# Patient Record
Sex: Female | Born: 1935 | Race: White | Hispanic: No | Smoking: Never smoker
Health system: Southern US, Community
[De-identification: ages and names within clinical notes are randomized; demographics above are authoritative.]

## PROBLEM LIST (undated history)

## (undated) DIAGNOSIS — E7800 Pure hypercholesterolemia, unspecified: Secondary | ICD-10-CM

## (undated) DIAGNOSIS — H35349 Macular cyst, hole, or pseudohole, unspecified eye: Secondary | ICD-10-CM

## (undated) DIAGNOSIS — A4902 Methicillin resistant Staphylococcus aureus infection, unspecified site: Secondary | ICD-10-CM

## (undated) DIAGNOSIS — K859 Acute pancreatitis without necrosis or infection, unspecified: Secondary | ICD-10-CM

## (undated) DIAGNOSIS — G8929 Other chronic pain: Secondary | ICD-10-CM

## (undated) DIAGNOSIS — I4510 Unspecified right bundle-branch block: Secondary | ICD-10-CM

## (undated) DIAGNOSIS — R519 Headache, unspecified: Secondary | ICD-10-CM

## (undated) DIAGNOSIS — R51 Headache: Secondary | ICD-10-CM

## (undated) DIAGNOSIS — M810 Age-related osteoporosis without current pathological fracture: Secondary | ICD-10-CM

## (undated) DIAGNOSIS — N83209 Unspecified ovarian cyst, unspecified side: Secondary | ICD-10-CM

## (undated) DIAGNOSIS — I350 Nonrheumatic aortic (valve) stenosis: Secondary | ICD-10-CM

## (undated) DIAGNOSIS — R5382 Chronic fatigue, unspecified: Secondary | ICD-10-CM

## (undated) DIAGNOSIS — M549 Dorsalgia, unspecified: Secondary | ICD-10-CM

## (undated) DIAGNOSIS — I10 Essential (primary) hypertension: Secondary | ICD-10-CM

## (undated) DIAGNOSIS — B958 Unspecified staphylococcus as the cause of diseases classified elsewhere: Secondary | ICD-10-CM

## (undated) DIAGNOSIS — M4850XA Collapsed vertebra, not elsewhere classified, site unspecified, initial encounter for fracture: Secondary | ICD-10-CM

## (undated) HISTORY — DX: Unspecified right bundle-branch block: I45.10

## (undated) HISTORY — DX: Collapsed vertebra, not elsewhere classified, site unspecified, initial encounter for fracture: M48.50XA

## (undated) HISTORY — PX: ABDOMINAL HYSTERECTOMY: SHX81

## (undated) HISTORY — PX: OTHER SURGICAL HISTORY: SHX169

## (undated) HISTORY — DX: Macular cyst, hole, or pseudohole, unspecified eye: H35.349

## (undated) HISTORY — DX: Chronic fatigue, unspecified: R53.82

## (undated) HISTORY — DX: Pure hypercholesterolemia, unspecified: E78.00

## (undated) HISTORY — DX: Essential (primary) hypertension: I10

## (undated) HISTORY — PX: APPENDECTOMY: SHX54

## (undated) HISTORY — DX: Nonrheumatic aortic (valve) stenosis: I35.0

## (undated) HISTORY — PX: CHOLECYSTECTOMY: SHX55

## (undated) HISTORY — DX: Acute pancreatitis without necrosis or infection, unspecified: K85.90

---

## 1998-01-12 ENCOUNTER — Other Ambulatory Visit: Admission: RE | Admit: 1998-01-12 | Discharge: 1998-01-12 | Payer: Self-pay | Admitting: Obstetrics & Gynecology

## 1998-02-08 ENCOUNTER — Emergency Department (HOSPITAL_COMMUNITY): Admission: EM | Admit: 1998-02-08 | Discharge: 1998-02-08 | Payer: Self-pay | Admitting: Emergency Medicine

## 1998-02-21 ENCOUNTER — Ambulatory Visit (HOSPITAL_BASED_OUTPATIENT_CLINIC_OR_DEPARTMENT_OTHER): Admission: RE | Admit: 1998-02-21 | Discharge: 1998-02-21 | Payer: Self-pay | Admitting: Orthopedic Surgery

## 1999-01-31 ENCOUNTER — Other Ambulatory Visit: Admission: RE | Admit: 1999-01-31 | Discharge: 1999-01-31 | Payer: Self-pay | Admitting: Obstetrics and Gynecology

## 1999-02-07 ENCOUNTER — Encounter: Admission: RE | Admit: 1999-02-07 | Discharge: 1999-02-07 | Payer: Self-pay | Admitting: *Deleted

## 1999-02-14 ENCOUNTER — Encounter: Admission: RE | Admit: 1999-02-14 | Discharge: 1999-02-14 | Payer: Self-pay | Admitting: *Deleted

## 2000-03-12 ENCOUNTER — Encounter: Admission: RE | Admit: 2000-03-12 | Discharge: 2000-03-12 | Payer: Self-pay | Admitting: *Deleted

## 2000-04-11 ENCOUNTER — Other Ambulatory Visit: Admission: RE | Admit: 2000-04-11 | Discharge: 2000-04-11 | Payer: Self-pay | Admitting: Obstetrics and Gynecology

## 2001-07-28 ENCOUNTER — Encounter: Payer: Self-pay | Admitting: Obstetrics and Gynecology

## 2001-07-28 ENCOUNTER — Encounter: Admission: RE | Admit: 2001-07-28 | Discharge: 2001-07-28 | Payer: Self-pay | Admitting: Obstetrics and Gynecology

## 2001-09-07 ENCOUNTER — Other Ambulatory Visit: Admission: RE | Admit: 2001-09-07 | Discharge: 2001-09-07 | Payer: Self-pay | Admitting: Obstetrics and Gynecology

## 2002-11-11 ENCOUNTER — Encounter: Admission: RE | Admit: 2002-11-11 | Discharge: 2002-11-11 | Payer: Self-pay | Admitting: Obstetrics and Gynecology

## 2002-11-11 ENCOUNTER — Encounter: Payer: Self-pay | Admitting: Obstetrics and Gynecology

## 2004-02-08 ENCOUNTER — Ambulatory Visit (HOSPITAL_COMMUNITY): Admission: RE | Admit: 2004-02-08 | Discharge: 2004-02-08 | Payer: Self-pay | Admitting: Internal Medicine

## 2004-04-23 ENCOUNTER — Encounter: Admission: RE | Admit: 2004-04-23 | Discharge: 2004-04-23 | Payer: Self-pay | Admitting: Orthopedic Surgery

## 2004-04-24 ENCOUNTER — Ambulatory Visit (HOSPITAL_COMMUNITY): Admission: RE | Admit: 2004-04-24 | Discharge: 2004-04-24 | Payer: Self-pay | Admitting: Orthopedic Surgery

## 2004-04-24 ENCOUNTER — Ambulatory Visit (HOSPITAL_BASED_OUTPATIENT_CLINIC_OR_DEPARTMENT_OTHER): Admission: RE | Admit: 2004-04-24 | Discharge: 2004-04-24 | Payer: Self-pay | Admitting: Orthopedic Surgery

## 2005-05-14 ENCOUNTER — Ambulatory Visit (HOSPITAL_COMMUNITY): Admission: RE | Admit: 2005-05-14 | Discharge: 2005-05-14 | Payer: Self-pay | Admitting: Obstetrics and Gynecology

## 2006-05-16 ENCOUNTER — Ambulatory Visit (HOSPITAL_COMMUNITY): Admission: RE | Admit: 2006-05-16 | Discharge: 2006-05-16 | Payer: Self-pay | Admitting: Obstetrics and Gynecology

## 2007-05-28 ENCOUNTER — Ambulatory Visit (HOSPITAL_COMMUNITY): Admission: RE | Admit: 2007-05-28 | Discharge: 2007-05-28 | Payer: Self-pay | Admitting: Obstetrics and Gynecology

## 2008-06-10 ENCOUNTER — Ambulatory Visit (HOSPITAL_COMMUNITY): Admission: RE | Admit: 2008-06-10 | Discharge: 2008-06-10 | Payer: Self-pay | Admitting: Obstetrics and Gynecology

## 2009-07-25 ENCOUNTER — Ambulatory Visit (HOSPITAL_COMMUNITY): Admission: RE | Admit: 2009-07-25 | Discharge: 2009-07-25 | Payer: Self-pay | Admitting: Obstetrics and Gynecology

## 2010-04-13 ENCOUNTER — Ambulatory Visit: Payer: Self-pay | Admitting: Cardiology

## 2010-04-24 ENCOUNTER — Ambulatory Visit (INDEPENDENT_AMBULATORY_CARE_PROVIDER_SITE_OTHER): Payer: Medicare Other | Admitting: Cardiology

## 2010-04-24 DIAGNOSIS — I10 Essential (primary) hypertension: Secondary | ICD-10-CM

## 2010-04-24 DIAGNOSIS — I452 Bifascicular block: Secondary | ICD-10-CM

## 2010-04-24 DIAGNOSIS — I359 Nonrheumatic aortic valve disorder, unspecified: Secondary | ICD-10-CM

## 2010-06-29 NOTE — Op Note (Signed)
Mallory Jensen, Mallory Jensen           ACCOUNT NO.:  [id number]   MEDICAL RECORD NO.:  [id number]          PATIENT TYPE:  AMB   LOCATION:  DSC                          FACILITY:  [facility]   PHYSICIAN:  Katy Fitch. Sypher Montez Hageman., M.D.DATE OF BIRTH:  1922/07/17   DATE OF PROCEDURE:  04/24/2004  DATE OF DISCHARGE:                                 OPERATIVE REPORT   PREOPERATIVE DIAGNOSIS:  Chronic entrapment neuropathy, right median nerve  at carpal tunnel.   POSTOPERATIVE DIAGNOSIS:  Chronic entrapment neuropathy, right median nerve  at carpal tunnel.   OPERATION:  Release of right transverse carpal ligament.   OPERATING SURGEON:  Katy Fitch. Sypher, M.D.   ASSISTANT:  Jonni Sanger, P.A.   ANESTHESIA:  General by LMA.   SUPERVISING ANESTHESIOLOGIST:  Dr. Adonis Huguenin.   INDICATIONS:  Mallory Jensen is a well-known 77 year old woman who has  had a history of chronic hand pain and numbness. Clinical examination  revealed signs of carpal tunnel syndrome. Electrodiagnostic studies  confirmed median neuropathy at the level of the right wrist.  Due to her  failure to respond to nonoperative measures, she is brought to this time for  release of right transverse carpal ligament.   PROCEDURE:  Mallory Jensen was brought to the operating room and placed  supine position on the table. Following induction of general anesthesia by  LMA, the right arm was prepped with Betadine soap solution, sterilely  draped.  Following exsanguination of the limb with Esmarch bandage, arterial  tourniquet was inflated on the proximal brachium to 220 mmHg. Procedure  commenced with a short incision in the line of the ring finger in the palm.  The subcutaneous tissues were carefully divided revealing the palmar  fascia.This was split longitudinally __________ branch of the median nerve.   These were followed back to transverse carpal ligament which was carefully  isolated from the median nerve. Ligament was then  released along its ulnar  border extending into the distal forearm. This widely opened carpal canal.  No masses or other predicaments were noted. Bleeding points along the margin  of the released ligament were electrocauterized with bipolar current  followed by repair of the skin with intradermal 3-0 Prolene suture.   A compressive dressing was applied with a volar plaster splint maintaining  the wrist in 5 degrees of dorsiflexion.    RVS/MEDQ  D:  04/24/2004  T:  04/24/2004  Job:  [id number]

## 2010-08-01 ENCOUNTER — Other Ambulatory Visit (HOSPITAL_COMMUNITY): Payer: Self-pay | Admitting: Internal Medicine

## 2010-08-01 DIAGNOSIS — Z1231 Encounter for screening mammogram for malignant neoplasm of breast: Secondary | ICD-10-CM

## 2010-08-22 ENCOUNTER — Ambulatory Visit (HOSPITAL_COMMUNITY)
Admission: RE | Admit: 2010-08-22 | Discharge: 2010-08-22 | Disposition: A | Discharge status: Home / Self Care | Payer: Medicare Other | Source: Ambulatory Visit | Attending: Internal Medicine | Admitting: Internal Medicine

## 2010-08-22 DIAGNOSIS — Z1231 Encounter for screening mammogram for malignant neoplasm of breast: Secondary | ICD-10-CM | POA: Insufficient documentation

## 2011-02-28 DIAGNOSIS — D638 Anemia in other chronic diseases classified elsewhere: Secondary | ICD-10-CM | POA: Diagnosis not present

## 2011-02-28 DIAGNOSIS — I10 Essential (primary) hypertension: Secondary | ICD-10-CM | POA: Diagnosis not present

## 2011-02-28 DIAGNOSIS — E559 Vitamin D deficiency, unspecified: Secondary | ICD-10-CM | POA: Diagnosis not present

## 2011-02-28 DIAGNOSIS — E781 Pure hyperglyceridemia: Secondary | ICD-10-CM | POA: Diagnosis not present

## 2011-04-22 DIAGNOSIS — R509 Fever, unspecified: Secondary | ICD-10-CM | POA: Diagnosis not present

## 2011-04-22 DIAGNOSIS — R197 Diarrhea, unspecified: Secondary | ICD-10-CM | POA: Diagnosis not present

## 2011-04-22 DIAGNOSIS — R110 Nausea: Secondary | ICD-10-CM | POA: Diagnosis not present

## 2011-05-09 ENCOUNTER — Ambulatory Visit: Payer: PRIVATE HEALTH INSURANCE | Admitting: Cardiology

## 2011-06-25 ENCOUNTER — Encounter: Payer: Self-pay | Admitting: *Deleted

## 2011-06-26 DIAGNOSIS — M549 Dorsalgia, unspecified: Secondary | ICD-10-CM | POA: Diagnosis not present

## 2011-06-26 DIAGNOSIS — M1990 Unspecified osteoarthritis, unspecified site: Secondary | ICD-10-CM | POA: Diagnosis not present

## 2011-06-26 DIAGNOSIS — M5137 Other intervertebral disc degeneration, lumbosacral region: Secondary | ICD-10-CM | POA: Diagnosis not present

## 2011-06-26 DIAGNOSIS — IMO0002 Reserved for concepts with insufficient information to code with codable children: Secondary | ICD-10-CM | POA: Diagnosis not present

## 2011-06-26 DIAGNOSIS — I10 Essential (primary) hypertension: Secondary | ICD-10-CM | POA: Diagnosis not present

## 2011-07-03 DIAGNOSIS — M8448XA Pathological fracture, other site, initial encounter for fracture: Secondary | ICD-10-CM | POA: Diagnosis not present

## 2011-07-03 DIAGNOSIS — R9389 Abnormal findings on diagnostic imaging of other specified body structures: Secondary | ICD-10-CM | POA: Diagnosis not present

## 2011-07-05 DIAGNOSIS — M545 Low back pain: Secondary | ICD-10-CM | POA: Diagnosis not present

## 2011-07-09 ENCOUNTER — Ambulatory Visit: Payer: PRIVATE HEALTH INSURANCE | Admitting: Cardiology

## 2011-07-09 ENCOUNTER — Telehealth: Payer: Self-pay | Admitting: Cardiology

## 2011-07-09 NOTE — Telephone Encounter (Signed)
New msg Pt wants to talk to you about procedure she is having for her back. Please call

## 2011-07-09 NOTE — Telephone Encounter (Signed)
Patient called stated she tripped over a cat gate last week and fell.Stated she injured her back and is scheduled to have a epidural injection next week 07/17/11 and was wanting to make sure that is ok with Dr.Jordan.States if epidural dont work she will have a cement procedure.Patient was told will let Dr.Jordan know and call her back this pm.

## 2011-07-09 NOTE — Telephone Encounter (Signed)
Patient called phone rings busy. 

## 2011-07-09 NOTE — Telephone Encounter (Signed)
Patient called was told spoke to Dr.Jordan ok to have epidural and cement procedure if needed.Keep appointment 08/07/11.

## 2011-07-17 DIAGNOSIS — M545 Low back pain: Secondary | ICD-10-CM | POA: Diagnosis not present

## 2011-07-30 DIAGNOSIS — M545 Low back pain: Secondary | ICD-10-CM | POA: Diagnosis not present

## 2011-08-07 ENCOUNTER — Encounter: Payer: Self-pay | Admitting: Cardiology

## 2011-08-07 ENCOUNTER — Ambulatory Visit (INDEPENDENT_AMBULATORY_CARE_PROVIDER_SITE_OTHER): Payer: Medicare Other | Admitting: Cardiology

## 2011-08-07 VITALS — BP 117/53 | HR 70 | Ht 60.0 in | Wt 132.0 lb

## 2011-08-07 DIAGNOSIS — E785 Hyperlipidemia, unspecified: Secondary | ICD-10-CM

## 2011-08-07 DIAGNOSIS — E7800 Pure hypercholesterolemia, unspecified: Secondary | ICD-10-CM | POA: Diagnosis not present

## 2011-08-07 DIAGNOSIS — I10 Essential (primary) hypertension: Secondary | ICD-10-CM | POA: Diagnosis not present

## 2011-08-07 DIAGNOSIS — I359 Nonrheumatic aortic valve disorder, unspecified: Secondary | ICD-10-CM

## 2011-08-07 DIAGNOSIS — I4510 Unspecified right bundle-branch block: Secondary | ICD-10-CM | POA: Diagnosis not present

## 2011-08-07 DIAGNOSIS — I350 Nonrheumatic aortic (valve) stenosis: Secondary | ICD-10-CM | POA: Insufficient documentation

## 2011-08-07 NOTE — Assessment & Plan Note (Signed)
Blood pressure is well controlled on current medication.

## 2011-08-07 NOTE — Assessment & Plan Note (Signed)
Chronic right bundle branch block without evidence of AV block.

## 2011-08-07 NOTE — Progress Notes (Signed)
   [name] Date of Birth: 1922/10/25 Medical Record #[id number]  History of Present Illness: Mallory Jensen is seen for yearly followup. She has a history of hypertension, hyperlipidemia, and right bundle branch block. She has done well over this past year except for an accident that she had 6 weeks ago where she tripped and fell and suffered a compression fracture. 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 She is currently taking tramadol. She still has a moderate amount of pain. She denies any shortness of breath or chest pain. She's had no dizziness or syncope.  Current Outpatient Prescriptions on File Prior to Visit  Medication Sig Dispense Refill  . amLODipine (NORVASC) 10 MG tablet Take 5 mg by mouth daily.      Marland Kitchen aspirin 81 MG tablet Take 81 mg by mouth daily.      . Choline Fenofibrate (TRILIPIX) 135 MG capsule Take 135 mg by mouth daily.      Marland Kitchen losartan-hydrochlorothiazide (HYZAAR) 100-25 MG per tablet 1 tab  daily        Allergies  Allergen Reactions  . Codeine   . Penicillins     Past Medical History  Diagnosis Date  . HTN (hypertension)   . Hypercholesteremia   . RBBB   . Mild aortic stenosis   . Chronic fatigue   . Compression fracture of vertebral column     Past Surgical History  Procedure Date  . Cholecystectomy   . Appendectomy   . Cesarean section     History  Smoking status  . Never Smoker   Smokeless tobacco  . Not on file    History  Alcohol Use No    Family History  Problem Relation Age of Onset  . Cancer    . Heart disease    . Heart disease Brother   . Heart disease Brother     Review of Systems: The review of systems is positive for back pain.  All other systems were reviewed and are negative.  Physical Exam: BP 117/53  Pulse 70  Ht 5' (1.524 m)  Wt 132 lb (59.875 kg)  BMI 25.78 kg/m2 She is an elderly white female in no acute distress. Her HEENT exam is unremarkable. She has no JVD or bruits.  Lungs are clear. Cardiac exam reveals a grade 1/6 systolic ejection murmur the right upper sternal border. There is no diastolic murmur or S3. Abdomen is obese, soft, nontender. She is kyphotic. She has no edema. Pedal pulses are good. She is alert and oriented x3. Cranial nerves II through XII are intact. LABORATORY DATA:  ECG demonstrates normal sinus rhythm with an old right bundle branch block. Pulse is 67 beats per minute Assessment / Plan:

## 2011-08-07 NOTE — Assessment & Plan Note (Signed)
There is no change in her exam and she is asymptomatic. No further evaluation needed.

## 2011-08-07 NOTE — Patient Instructions (Signed)
Continue your current therapy  I will see you again in 1 year.   

## 2011-08-08 DIAGNOSIS — M545 Low back pain: Secondary | ICD-10-CM | POA: Diagnosis not present

## 2011-08-13 DIAGNOSIS — M545 Low back pain: Secondary | ICD-10-CM | POA: Diagnosis not present

## 2011-08-16 DIAGNOSIS — M545 Low back pain: Secondary | ICD-10-CM | POA: Diagnosis not present

## 2011-08-17 ENCOUNTER — Emergency Department (HOSPITAL_COMMUNITY): Payer: Medicare Other

## 2011-08-17 ENCOUNTER — Encounter (HOSPITAL_COMMUNITY): Payer: Self-pay | Admitting: Emergency Medicine

## 2011-08-17 ENCOUNTER — Emergency Department (HOSPITAL_COMMUNITY)
Admission: EM | Admit: 2011-08-17 | Discharge: 2011-08-17 | Disposition: A | Discharge status: Home / Self Care | Payer: Medicare Other | Attending: Emergency Medicine | Admitting: Emergency Medicine

## 2011-08-17 DIAGNOSIS — S22000A Wedge compression fracture of unspecified thoracic vertebra, initial encounter for closed fracture: Secondary | ICD-10-CM

## 2011-08-17 DIAGNOSIS — I10 Essential (primary) hypertension: Secondary | ICD-10-CM | POA: Diagnosis not present

## 2011-08-17 DIAGNOSIS — Z79899 Other long term (current) drug therapy: Secondary | ICD-10-CM | POA: Diagnosis not present

## 2011-08-17 DIAGNOSIS — G319 Degenerative disease of nervous system, unspecified: Secondary | ICD-10-CM | POA: Diagnosis not present

## 2011-08-17 DIAGNOSIS — W19XXXA Unspecified fall, initial encounter: Secondary | ICD-10-CM

## 2011-08-17 DIAGNOSIS — S0990XA Unspecified injury of head, initial encounter: Secondary | ICD-10-CM | POA: Diagnosis not present

## 2011-08-17 DIAGNOSIS — S22009A Unspecified fracture of unspecified thoracic vertebra, initial encounter for closed fracture: Secondary | ICD-10-CM | POA: Diagnosis not present

## 2011-08-17 DIAGNOSIS — M545 Low back pain, unspecified: Secondary | ICD-10-CM | POA: Diagnosis not present

## 2011-08-17 DIAGNOSIS — R5381 Other malaise: Secondary | ICD-10-CM | POA: Diagnosis not present

## 2011-08-17 DIAGNOSIS — E7800 Pure hypercholesterolemia, unspecified: Secondary | ICD-10-CM | POA: Diagnosis not present

## 2011-08-17 DIAGNOSIS — M546 Pain in thoracic spine: Secondary | ICD-10-CM | POA: Diagnosis not present

## 2011-08-17 DIAGNOSIS — T148XXA Other injury of unspecified body region, initial encounter: Secondary | ICD-10-CM | POA: Diagnosis not present

## 2011-08-17 DIAGNOSIS — Y92009 Unspecified place in unspecified non-institutional (private) residence as the place of occurrence of the external cause: Secondary | ICD-10-CM | POA: Insufficient documentation

## 2011-08-17 DIAGNOSIS — W010XXA Fall on same level from slipping, tripping and stumbling without subsequent striking against object, initial encounter: Secondary | ICD-10-CM | POA: Insufficient documentation

## 2011-08-17 LAB — URINALYSIS, ROUTINE W REFLEX MICROSCOPIC
Bilirubin Urine: NEGATIVE
Glucose, UA: NEGATIVE mg/dL
Specific Gravity, Urine: 1.018 (ref 1.005–1.030)
pH: 5.5 (ref 5.0–8.0)

## 2011-08-17 LAB — POCT I-STAT, CHEM 8
Calcium, Ion: 1.61 mmol/L — ABNORMAL HIGH (ref 1.13–1.30)
Glucose, Bld: 133 mg/dL — ABNORMAL HIGH (ref 70–99)
HCT: 34 % — ABNORMAL LOW (ref 36.0–46.0)
Potassium: 3.6 mEq/L (ref 3.5–5.1)
Sodium: 137 mEq/L (ref 135–145)
TCO2: 27 mmol/L (ref 0–100)

## 2011-08-17 LAB — URINE MICROSCOPIC-ADD ON

## 2011-08-17 LAB — GLUCOSE, CAPILLARY: Glucose-Capillary: 123 mg/dL — ABNORMAL HIGH (ref 70–99)

## 2011-08-17 MED ORDER — SODIUM CHLORIDE 0.9 % IV BOLUS (SEPSIS)
500.0000 mL | Freq: Once | INTRAVENOUS | Status: AC
  Administered 2011-08-17: 500 mL via INTRAVENOUS

## 2011-08-17 NOTE — ED Notes (Signed)
Discussed bathroom falls prevention with family. Family reports no hand rails, raised toilet seats, walker, etc.

## 2011-08-17 NOTE — ED Provider Notes (Signed)
History     CSN: [id number]  Arrival date & time 08/17/11  1210   First MD Initiated Contact with Patient 08/17/11 1223      Chief Complaint  Patient presents with  . Fall  . Back Pain    (Consider location/radiation/quality/duration/timing/severity/associated sxs/prior treatment) HPI Pt presenting after slip and fall on bathroom floor- she was on the floor last night and was not having pain but was not able to get up off the floor.  Her daughter found her this morning and EMS was called.  Pt has hx of recent thoracic compression fracture.  However, she denies any increased pain.  She also feels fatigued at this time because she was up all night trying to pull herself off the floor.  There are no other associated systemic symptoms, there are no other alleviating or modifying factors.   Past Medical History  Diagnosis Date  . HTN (hypertension)   . Hypercholesteremia   . RBBB   . Mild aortic stenosis   . Chronic fatigue   . Compression fracture of vertebral column     Past Surgical History  Procedure Date  . Cholecystectomy   . Appendectomy   . Cesarean section     Family History  Problem Relation Age of Onset  . Cancer    . Heart disease    . Heart disease Brother   . Heart disease Brother     History  Substance Use Topics  . Smoking status: Never Smoker   . Smokeless tobacco: Not on file  . Alcohol Use: No    OB History    Grav Para Term Preterm Abortions TAB SAB Ect Mult Living                  Review of Systems ROS reviewed and all otherwise negative except for mentioned in HPI  Allergies  Codeine and Penicillins  Home Medications   Current Outpatient Rx  Name Route Sig Dispense Refill  . AMLODIPINE BESYLATE 5 MG PO TABS Oral Take 5 mg by mouth daily.    . CHOLINE FENOFIBRATE 135 MG PO CPDR Oral Take 135 mg by mouth daily.    Marland Kitchen LOSARTAN POTASSIUM-HCTZ 100-25 MG PO TABS  1 tab  daily    . ADULT MULTIVITAMIN W/MINERALS CH Oral Take 1 tablet by  mouth daily.    . TRAMADOL HCL 50 MG PO TABS Oral Take 50 mg by mouth every 6 (six) hours as needed. PAIN      BP 135/50  Pulse 78  Temp 97.7 F (36.5 C) (Oral)  Resp 16  SpO2 89% Vitals reviewed Physical Exam Physical Examination: General appearance - alert, tired appearing, and in no distress Mental status - alert, oriented to person, place, and time Eyes - pupils equal and reactive, extraocular eye movements intact Mouth - mucous membranes moist, pharynx normal without lesions Chest - clear to auscultation, no wheezes, rales or rhonchi, symmetric air entry Heart - normal rate, regular rhythm, normal S1, S2, no murmurs, rubs, clicks or gallops Abdomen - soft, nontender, nondistended, no masses or organomegaly, nabs Neurological - alert, oriented, normal speech, strength 5/5 in extremities x 4, sensation intact Musculoskeletal - no joint tenderness, deformity or swelling, mild contusions over bilateral elbows and knees- no bony point tenderness or deformity Extremities - peripheral pulses normal, no pedal edema, no clubbing or cyanosis Skin - normal coloration and turgor, no rashes, mild contusions as noted above  ED Course  Procedures (including critical care time)  Labs Reviewed  URINALYSIS, ROUTINE W REFLEX MICROSCOPIC - Abnormal; Notable for the following:    APPearance CLOUDY (*)     Hgb urine dipstick LARGE (*)     Protein, ur 30 (*)     All other components within normal limits  GLUCOSE, CAPILLARY - Abnormal; Notable for the following:    Glucose-Capillary 123 (*)     All other components within normal limits  POCT I-STAT, CHEM 8 - Abnormal; Notable for the following:    Glucose, Bld 133 (*)     Calcium, Ion 1.61 (*)     Hemoglobin 11.6 (*)     HCT 34.0 (*)     All other components within normal limits  URINE MICROSCOPIC-ADD ON - Abnormal; Notable for the following:    Bacteria, UA MANY (*)     All other components within normal limits  URINE CULTURE   Dg  Thoracic Spine 2 View  08/17/2011  *RADIOLOGY REPORT*  Clinical Data: Larey Seat yesterday, persistent mid back pain.  THORACIC SPINE - 2 VIEW  Comparison: Two-view chest x-ray 04/23/2004.  No prior lower thoracic spine imaging.  Findings: 12 rib-bearing thoracic vertebrae with T12 having small, rudimentary ribs.  Compression fracture of the T12 vertebral body on the order of 70%, with slight retropulsion.  Widening of the paraspinous soft tissues in the vicinity of the fracture, consistent with an associated hematoma.  No other fractures. Moderate diffuse thoracic spondylosis.  Pedicles intact. Osteopenia.  IMPRESSION: Likely acute compression fracture involving the T12 vertebral body with very slight retropulsion, on the order of 70%.  If this is indeed an acute fracture, the patient has a diagnosis of established osteoporosis, and bone building therapy should be considered.  Original Report Authenticated By: Arnell Sieving, M.D.   Dg Lumbar Spine Complete  08/17/2011  *RADIOLOGY REPORT*  Clinical Data: Post fall, now with mid back pain  LUMBAR SPINE - COMPLETE 4+ VIEW  Comparison: Chest radiograph - 04/23/2004  Findings:  There are five non-rib bearing lumbar type vertebral bodies with diminutive ribs noted bilaterally at T12.  Moderate to severe (approximately 70%) compression deformity of the T12 vertebral body with approximately 7 mm of retropulsion of the posterior inferior endplate of the T12 vertebral body into the spinal canal.  Vertebral body heights are otherwise well preserved. Alignment is otherwise normal.  Mild multilevel DDD, worse at L1 - L2.  IMPRESSION:  Moderate to severe (approximately 70%) compression deformity of the T12 vertebral body with approximately 7 mm of retropulsion of the posterior inferior endplate of the T12 vertebral body into the spinal canal. This was not definitely seen on prior chest radiograph performed 04/2004.  In the absence of more recent prior examinations, this is  presumably an acute finding.  Clinical correlation is advised.  Above findings discussed with Dr. Karma Ganja  at [phone].  Original Report Authenticated By: Waynard Reeds, M.D.   Ct Head Wo Contrast  08/17/2011  *RADIOLOGY REPORT*  Clinical Data: Larey Seat yesterday at approximately 1920 hours, unable to obtain assistance until earlier today.  CT HEAD WITHOUT CONTRAST  Technique:  Contiguous axial images were obtained from the base of the skull through the vertex without contrast.  Comparison: None.  Findings: Moderate to severe cortical and deep atrophy and moderate cerebellar atrophy.  Moderate changes of small vessel disease of the white matter diffusely, including the brainstem and cerebellum. No mass lesion.  No midline shift.  No acute hemorrhage or hematoma.  No extra-axial fluid collections.  No evidence of acute infarction.  No skull fracture or other focal osseous abnormality involving the skull.  Mucosal thickening involving the base of the right maxillary sinus.  Remaining visualized paranasal sinuses, bilateral mastoid air cells, and bilateral middle ear cavities well-aerated. Bilateral carotid siphon and vertebral artery atherosclerosis.  IMPRESSION:  1.  No acute intracranial abnormality. 2.  Moderate to severe generalized atrophy and moderate chronic microvascular ischemic changes of the white matter. 3.  Chronic right maxillary sinusitis.  Original Report Authenticated By: Arnell Sieving, M.D.     1. Fall   2. Compression fracture of thoracic vertebra       MDM  Pt presents after mechanical fall causing her to lie on floor at home approx 12 hours.  No signficant new injury- pt with recent T12 compression fracture.  Pt given I V hydration in the ED, labs reassuring.  Discharged with strict return precautions, she and family area agreeable with this plan.         Ethelda Chick, MD 08/18/11 [phone]

## 2011-08-17 NOTE — ED Notes (Signed)
Patient in X ray will perform task when she returns

## 2011-08-17 NOTE — ED Notes (Signed)
WJX:[id number]<WG> Expected date:08/17/11<BR> Expected time:12:09 PM<BR> Means of arrival:Ambulance<BR> Comments:<BR> Back pain

## 2011-08-17 NOTE — ED Notes (Signed)
Per tech, pt attempted to collect urine specimen in urine hat but had bowel movement into same collection hat.  Pt receiving IV fluids and will attempt again later.

## 2011-08-17 NOTE — ED Notes (Signed)
RN to obtain labs with start of IV 

## 2011-08-17 NOTE — ED Notes (Signed)
Fell at [employer] yesterday. 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 Hx of thoracic compression fx. Fell in May and had pain in lower lumbar area. Dx'd with DDD at that time. Recently had steroid injection. Pain in "waistline" after climbing around in floor all night.

## 2011-08-17 NOTE — ED Notes (Signed)
Per Dr. Karma Ganja, pt okay to get up to [facility].

## 2011-08-18 LAB — URINE CULTURE

## 2011-08-22 DIAGNOSIS — M545 Low back pain: Secondary | ICD-10-CM | POA: Diagnosis not present

## 2011-08-27 DIAGNOSIS — M545 Low back pain: Secondary | ICD-10-CM | POA: Diagnosis not present

## 2011-08-29 DIAGNOSIS — M545 Low back pain: Secondary | ICD-10-CM | POA: Diagnosis not present

## 2011-09-03 DIAGNOSIS — M545 Low back pain: Secondary | ICD-10-CM | POA: Diagnosis not present

## 2011-09-05 DIAGNOSIS — E559 Vitamin D deficiency, unspecified: Secondary | ICD-10-CM | POA: Diagnosis not present

## 2011-09-05 DIAGNOSIS — D638 Anemia in other chronic diseases classified elsewhere: Secondary | ICD-10-CM | POA: Diagnosis not present

## 2011-09-05 DIAGNOSIS — N838 Other noninflammatory disorders of ovary, fallopian tube and broad ligament: Secondary | ICD-10-CM | POA: Diagnosis not present

## 2011-09-05 DIAGNOSIS — I10 Essential (primary) hypertension: Secondary | ICD-10-CM | POA: Diagnosis not present

## 2011-09-05 DIAGNOSIS — M810 Age-related osteoporosis without current pathological fracture: Secondary | ICD-10-CM | POA: Diagnosis not present

## 2011-09-10 DIAGNOSIS — M545 Low back pain: Secondary | ICD-10-CM | POA: Diagnosis not present

## 2011-09-11 DIAGNOSIS — M810 Age-related osteoporosis without current pathological fracture: Secondary | ICD-10-CM | POA: Diagnosis not present

## 2011-09-11 DIAGNOSIS — N83209 Unspecified ovarian cyst, unspecified side: Secondary | ICD-10-CM | POA: Diagnosis not present

## 2011-09-11 DIAGNOSIS — R1900 Intra-abdominal and pelvic swelling, mass and lump, unspecified site: Secondary | ICD-10-CM | POA: Diagnosis not present

## 2011-09-12 DIAGNOSIS — M545 Low back pain: Secondary | ICD-10-CM | POA: Diagnosis not present

## 2011-09-17 DIAGNOSIS — M545 Low back pain: Secondary | ICD-10-CM | POA: Diagnosis not present

## 2011-09-19 DIAGNOSIS — R1909 Other intra-abdominal and pelvic swelling, mass and lump: Secondary | ICD-10-CM | POA: Diagnosis not present

## 2011-09-19 DIAGNOSIS — R319 Hematuria, unspecified: Secondary | ICD-10-CM | POA: Diagnosis not present

## 2011-09-19 DIAGNOSIS — R1907 Generalized intra-abdominal and pelvic swelling, mass and lump: Secondary | ICD-10-CM | POA: Diagnosis not present

## 2011-09-19 DIAGNOSIS — N83209 Unspecified ovarian cyst, unspecified side: Secondary | ICD-10-CM | POA: Diagnosis not present

## 2011-09-23 ENCOUNTER — Other Ambulatory Visit (HOSPITAL_COMMUNITY): Payer: Self-pay | Admitting: *Deleted

## 2011-09-24 ENCOUNTER — Encounter (HOSPITAL_COMMUNITY)
Admission: RE | Admit: 2011-09-24 | Discharge: 2011-09-24 | Disposition: A | Discharge status: Home / Self Care | Payer: Medicare Other | Source: Ambulatory Visit | Attending: Internal Medicine | Admitting: Internal Medicine

## 2011-09-24 ENCOUNTER — Encounter (HOSPITAL_COMMUNITY): Payer: Self-pay

## 2011-09-24 DIAGNOSIS — M810 Age-related osteoporosis without current pathological fracture: Secondary | ICD-10-CM | POA: Diagnosis not present

## 2011-09-24 HISTORY — DX: Unspecified ovarian cyst, unspecified side: N83.209

## 2011-09-24 MED ORDER — ZOLEDRONIC ACID 5 MG/100ML IV SOLN
5.0000 mg | Freq: Once | INTRAVENOUS | Status: AC
  Administered 2011-09-24: 5 mg via INTRAVENOUS
  Filled 2011-09-24: qty 500

## 2011-09-24 MED ORDER — SODIUM CHLORIDE 0.9 % IV SOLN
Freq: Once | INTRAVENOUS | Status: AC
  Administered 2011-09-24: 11:00:00 via INTRAVENOUS

## 2011-10-01 DIAGNOSIS — H2000 Unspecified acute and subacute iridocyclitis: Secondary | ICD-10-CM | POA: Diagnosis not present

## 2011-10-08 DIAGNOSIS — H2000 Unspecified acute and subacute iridocyclitis: Secondary | ICD-10-CM | POA: Diagnosis not present

## 2011-10-25 ENCOUNTER — Encounter: Payer: Self-pay | Admitting: Cardiology

## 2011-10-28 ENCOUNTER — Encounter: Payer: Self-pay | Admitting: Cardiology

## 2011-11-01 DIAGNOSIS — M5137 Other intervertebral disc degeneration, lumbosacral region: Secondary | ICD-10-CM | POA: Diagnosis not present

## 2011-11-04 DIAGNOSIS — H2000 Unspecified acute and subacute iridocyclitis: Secondary | ICD-10-CM | POA: Diagnosis not present

## 2011-11-20 DIAGNOSIS — M5137 Other intervertebral disc degeneration, lumbosacral region: Secondary | ICD-10-CM | POA: Diagnosis not present

## 2011-12-03 DIAGNOSIS — Z23 Encounter for immunization: Secondary | ICD-10-CM | POA: Diagnosis not present

## 2011-12-06 DIAGNOSIS — M5137 Other intervertebral disc degeneration, lumbosacral region: Secondary | ICD-10-CM | POA: Diagnosis not present

## 2011-12-10 DIAGNOSIS — E781 Pure hyperglyceridemia: Secondary | ICD-10-CM | POA: Diagnosis not present

## 2011-12-10 DIAGNOSIS — M1990 Unspecified osteoarthritis, unspecified site: Secondary | ICD-10-CM | POA: Diagnosis not present

## 2011-12-10 DIAGNOSIS — I10 Essential (primary) hypertension: Secondary | ICD-10-CM | POA: Diagnosis not present

## 2011-12-10 DIAGNOSIS — Z1331 Encounter for screening for depression: Secondary | ICD-10-CM | POA: Diagnosis not present

## 2011-12-16 DIAGNOSIS — N83209 Unspecified ovarian cyst, unspecified side: Secondary | ICD-10-CM | POA: Diagnosis not present

## 2011-12-27 DIAGNOSIS — H52209 Unspecified astigmatism, unspecified eye: Secondary | ICD-10-CM | POA: Diagnosis not present

## 2011-12-27 DIAGNOSIS — Z961 Presence of intraocular lens: Secondary | ICD-10-CM | POA: Diagnosis not present

## 2012-01-13 DIAGNOSIS — I10 Essential (primary) hypertension: Secondary | ICD-10-CM | POA: Diagnosis not present

## 2012-01-13 DIAGNOSIS — R609 Edema, unspecified: Secondary | ICD-10-CM | POA: Diagnosis not present

## 2012-01-27 DIAGNOSIS — R1909 Other intra-abdominal and pelvic swelling, mass and lump: Secondary | ICD-10-CM | POA: Diagnosis not present

## 2012-02-12 DIAGNOSIS — K859 Acute pancreatitis without necrosis or infection, unspecified: Secondary | ICD-10-CM

## 2012-02-12 HISTORY — DX: Acute pancreatitis without necrosis or infection, unspecified: K85.90

## 2012-02-20 ENCOUNTER — Ambulatory Visit: Payer: Medicare Other | Attending: Gynecologic Oncology | Admitting: Gynecologic Oncology

## 2012-02-20 ENCOUNTER — Encounter: Payer: Self-pay | Admitting: Gynecologic Oncology

## 2012-02-20 VITALS — BP 160/72 | HR 68 | Temp 98.0°F | Resp 16 | Ht 60.63 in | Wt 140.8 lb

## 2012-02-20 DIAGNOSIS — N83209 Unspecified ovarian cyst, unspecified side: Secondary | ICD-10-CM | POA: Diagnosis not present

## 2012-02-20 DIAGNOSIS — R1900 Intra-abdominal and pelvic swelling, mass and lump, unspecified site: Secondary | ICD-10-CM | POA: Diagnosis not present

## 2012-02-20 NOTE — Progress Notes (Signed)
Consult Note: Gyn-Onc  Tulsi Crossett Glace 79 y.o. female  CC:  Chief Complaint  Patient presents with  . Ovarian Cyst    New patient    HPI: Patient is seen today in consultation at the request of Dr. Huel Cote for an ovarian mass.  Patient is an 79 year old gravida 5 para 5 who underwent a supracervical hysterectomy at the time of her last cesarean section in her 30s. It is unclear if the supracervical hysterectomy was done for contraceptive purposes or for another indication. She had no complications or issues at all until she fell in May of 2013. At that time she was seen by Dr. Marcelline Mates her primary physician who ordered imaging studies. The MRI on May 22 revealed a questionable pelvic cystic change likely of ovarian origin. An ultrasound was performed on July 13 that revealed a 3.1 x 2.4 x 2.4 cm cystic and solid right adnexal mass containing at least 2 internal cysts and extensive perilesional shadowing consistent with either calcifications or fats. There was minimal vascular flow the uterus was surgically absent. There is no free fluid in the left ovary was not identifiable. She was seen by Dr. Senaida Ores who ordered an ultrasound in August of 2013 that revealed a right ovarian mass measuring 0.42 x 0.43 x 0.41 cm. Versus measuring 7 x 10 mm 13 x 13 mm and 6 x 9 mm with 2 solid nodules posterior shadowing measuring 6 x 6 mm and 8 x 9 mm. The areas were avascular. There is no cul-de-sac fluid. The total ovarian size was 3.1 cm x 2.7 cm x 3.3 cm. CA 125 was obtained August 8 that was 8.3 with an HE4 at that time of 164 with a reference range of normal being less than 150. A repeat ultrasound was performed November 2013 that revealed no significant interval change in the ultrasound results of the previous exam. Repeat tumor markers were obtained in December this was revealed as CO2 5 of 7.7 and a T4 of 183. She comes in today for followup of that.  Review of Systems: Patient has  incontinence which is known to her. She states is worse if she delays going to the bathroom. She denies any change in her bowel or bladder habits otherwise. She denies any blood in her urine, vagina, or per rectum. She denies abdominal pain but she does have lower back pain and left-sided pain. She is under the care of an orthopedist for her compression fractures in her back. She states immediately after her fall she did lose some weight as her appetite decreased a bit she is now regaining that weight. She is concerned about the overall appearance of her body in that her abdomen is more protuberant as she's gotten shorter and we discussed this. She denies any early satiety. She has any chest pain, shortness of breath, nausea, vomiting, fevers, chills. She denies any unintentional weight loss or weight gain. She denies any headaches or visual changes. She denies any night sweats. She denies any lower extremity edema. 10 point review of systems is otherwise negative.  Current Meds:  Outpatient Encounter Prescriptions as of 02/20/2012  Medication Sig Dispense Refill  . aspirin 81 MG tablet Take 81 mg by mouth daily.      . calcium-vitamin D (OSCAL WITH D) 500-200 MG-UNIT per tablet Take 1 tablet by mouth daily.      . Choline Fenofibrate (TRILIPIX) 135 MG capsule Take 135 mg by mouth daily.      . fish  oil-omega-3 fatty acids 1000 MG capsule Take by mouth daily.      Marland Kitchen glucosamine-chondroitin 500-400 MG tablet Take 1 tablet by mouth daily after supper.      . losartan-hydrochlorothiazide (HYZAAR) 100-25 MG per tablet 1 tab  daily      . Multiple Vitamin (MULTIVITAMIN WITH MINERALS) TABS Take 1 tablet by mouth daily.      . naproxen sodium (ANAPROX) 220 MG tablet Take 220 mg by mouth as needed.      . ranitidine (ZANTAC) 75 MG tablet Take 75 mg by mouth every morning.      Marland Kitchen acetaminophen (TYLENOL) 500 MG tablet Take 1,000 mg by mouth every 6 (six) hours as needed.      Marland Kitchen amLODipine (NORVASC) 5 MG tablet  Take 5 mg by mouth daily.      . furosemide (LASIX) 20 MG tablet Take 20 mg by mouth daily.      . traMADol (ULTRAM) 50 MG tablet Take 50 mg by mouth every 6 (six) hours as needed. PAIN        Allergy: Penicillin causes hives or shortness of breath Allergies  Allergen Reactions  . Codeine Other (See Comments)    UNKNOWN  . Penicillins Other (See Comments)    UNKNOWN    Social Hx:   History   Social History  . Marital Status: Widowed    Spouse Name: N/A    Number of Children: 5  . Years of Education: N/A   Occupational History  . catering    Social History Main Topics  . Smoking status: Never Smoker   . Smokeless tobacco: Not on file  . Alcohol Use: No  . Drug Use: No  . Sexually Active: No   Other Topics Concern  . Not on file   Social History Narrative  . No narrative on file    Past Surgical Hx:  Past Surgical History  Procedure Date  . Cholecystectomy   . Appendectomy   . Cesarean section   . Athroscopy right shoulder   . Biopsy left breast x 2     Past Medical Hx:  Past Medical History  Diagnosis Date  . HTN (hypertension)   . Hypercholesteremia   . RBBB   . Mild aortic stenosis   . Chronic fatigue   . Compression fracture of vertebral column   . Ovarian cyst     Family Hx: Her father died of a metastatic throat cancer. She had a brother who died of lung cancer he was a smoker. She is due for her mammogram in May of 2013. She's never had a colonoscopy. She does her stool guaiac cards. Family History  Problem Relation Age of Onset  . Cancer    . Heart disease    . Heart disease Brother   . Heart disease Brother     Vitals:  Blood pressure 160/72, pulse 68, temperature 98 F (36.7 C), temperature source Oral, resp. rate 16, height 5' 0.63" (1.54 m), weight 140 lb 12.8 oz (63.866 kg).  Physical Exam: Well-nourished well-developed female in no acute distress.  Neck: Supple, no lymphadenopathy, no thyromegaly.  Lungs: Clear to auscultation  bilaterally.  Cardiovascular: Regular rate and rhythm.  Abdomen: Well-healed vertical midline incision. There is no evidence of an incisional hernia. Abdomen is soft, nontender. There is no fluid wave. There is no palpable masses or hepatosplenomegaly.  Groins: No lymphadenopathy.  Extremities: 1+ nonpitting edema equal bilaterally.  : Normal external female genitalia. Bimanual examination the cervix  is flush with vagina. There is no masses or nodularity.  Assessment/Plan: 79 year old who had an incidental right adnexal mass identified at the time of an MRI status post a fall with compression fractures in her back. These ovarian masses been followed expectantly with ultrasounds in July, August, in November with no change in the appearance of the ovary. CA 125 has been normal at 2 measurements both in August and a December. However, HE4 was elevated 2 times in August and then again in December. The patient was aware of these results of her daughters were not. They asked for a copy of the labs which was provided to them. I discussed with them that the HE$ can be a marker an ovarian cancer as can the CA 125. I discussed with them that we typically use a CA 125 as the more established tumor marker. While I cannot ensure that there is no evidence of malignancy believe the HE4 may be falsely elevated.    I would expect with ovarian cancer she would've had other changes noted on ultrasound with a 6 month followup. At this point I would recommend close followup with repeat imaging in 4 months with Dr. Senaida Ores. If the cyst is not changed over that period of time and based on what appears to be calcifications or fat and if this continues to be avascular I would monitor expectantly. I discussed with them that I believe the risks of surgery to the patient are higher than the risks of this actually being a cancer and the patient and her two daughters who accompany her today agree with that. They are pleased to  not have to proceed with surgery at this time. Their questions regarding the imaging and tumor markers were discussed. The patient was under the understanding that she had "cancer cells in her blood". I discussed with her that these are not related to cancer cells that tumor markers are essentially chemicals that cancers can release in the blood that we can identify with blood work. She was happy to understand this differentiation. They will followup with Dr. Senaida Ores and Dr. Wylene Simmer as scheduled. They have our card and note that we'll be happy to see her in the future should the need arise.  Nahlia Hellmann A., MD 02/20/2012, 11:45 AM

## 2012-03-12 DIAGNOSIS — R634 Abnormal weight loss: Secondary | ICD-10-CM | POA: Diagnosis not present

## 2012-03-12 DIAGNOSIS — D638 Anemia in other chronic diseases classified elsewhere: Secondary | ICD-10-CM | POA: Diagnosis not present

## 2012-03-12 DIAGNOSIS — E781 Pure hyperglyceridemia: Secondary | ICD-10-CM | POA: Diagnosis not present

## 2012-03-12 DIAGNOSIS — I10 Essential (primary) hypertension: Secondary | ICD-10-CM | POA: Diagnosis not present

## 2012-03-12 DIAGNOSIS — R609 Edema, unspecified: Secondary | ICD-10-CM | POA: Diagnosis not present

## 2012-03-12 DIAGNOSIS — E559 Vitamin D deficiency, unspecified: Secondary | ICD-10-CM | POA: Diagnosis not present

## 2012-03-12 DIAGNOSIS — F321 Major depressive disorder, single episode, moderate: Secondary | ICD-10-CM | POA: Diagnosis not present

## 2012-03-12 DIAGNOSIS — IMO0001 Reserved for inherently not codable concepts without codable children: Secondary | ICD-10-CM | POA: Diagnosis not present

## 2012-03-13 ENCOUNTER — Emergency Department (HOSPITAL_COMMUNITY): Payer: Medicare Other

## 2012-03-13 ENCOUNTER — Inpatient Hospital Stay (HOSPITAL_COMMUNITY)
Admission: EM | Admit: 2012-03-13 | Discharge: 2012-03-18 | DRG: 439 | Disposition: A | Discharge status: Skilled Nursing Facility | Payer: Medicare Other | Attending: Internal Medicine | Admitting: Internal Medicine

## 2012-03-13 ENCOUNTER — Encounter (HOSPITAL_COMMUNITY): Payer: Self-pay | Admitting: Emergency Medicine

## 2012-03-13 DIAGNOSIS — K859 Acute pancreatitis without necrosis or infection, unspecified: Secondary | ICD-10-CM | POA: Diagnosis not present

## 2012-03-13 DIAGNOSIS — R680 Hypothermia, not associated with low environmental temperature: Secondary | ICD-10-CM | POA: Diagnosis not present

## 2012-03-13 DIAGNOSIS — Z79899 Other long term (current) drug therapy: Secondary | ICD-10-CM

## 2012-03-13 DIAGNOSIS — E46 Unspecified protein-calorie malnutrition: Secondary | ICD-10-CM | POA: Diagnosis present

## 2012-03-13 DIAGNOSIS — D638 Anemia in other chronic diseases classified elsewhere: Secondary | ICD-10-CM | POA: Diagnosis present

## 2012-03-13 DIAGNOSIS — N281 Cyst of kidney, acquired: Secondary | ICD-10-CM | POA: Diagnosis not present

## 2012-03-13 DIAGNOSIS — G40909 Epilepsy, unspecified, not intractable, without status epilepticus: Secondary | ICD-10-CM | POA: Diagnosis not present

## 2012-03-13 DIAGNOSIS — S0990XA Unspecified injury of head, initial encounter: Secondary | ICD-10-CM | POA: Diagnosis not present

## 2012-03-13 DIAGNOSIS — S22009A Unspecified fracture of unspecified thoracic vertebra, initial encounter for closed fracture: Secondary | ICD-10-CM | POA: Diagnosis not present

## 2012-03-13 DIAGNOSIS — M545 Low back pain, unspecified: Secondary | ICD-10-CM | POA: Diagnosis not present

## 2012-03-13 DIAGNOSIS — M8448XD Pathological fracture, other site, subsequent encounter for fracture with routine healing: Secondary | ICD-10-CM | POA: Diagnosis not present

## 2012-03-13 DIAGNOSIS — N83209 Unspecified ovarian cyst, unspecified side: Secondary | ICD-10-CM | POA: Diagnosis not present

## 2012-03-13 DIAGNOSIS — M8448XA Pathological fracture, other site, initial encounter for fracture: Secondary | ICD-10-CM | POA: Diagnosis present

## 2012-03-13 DIAGNOSIS — Z5189 Encounter for other specified aftercare: Secondary | ICD-10-CM | POA: Diagnosis not present

## 2012-03-13 DIAGNOSIS — I359 Nonrheumatic aortic valve disorder, unspecified: Secondary | ICD-10-CM | POA: Diagnosis present

## 2012-03-13 DIAGNOSIS — F29 Unspecified psychosis not due to a substance or known physiological condition: Secondary | ICD-10-CM | POA: Diagnosis not present

## 2012-03-13 DIAGNOSIS — I10 Essential (primary) hypertension: Secondary | ICD-10-CM | POA: Diagnosis present

## 2012-03-13 DIAGNOSIS — K5900 Constipation, unspecified: Secondary | ICD-10-CM | POA: Diagnosis present

## 2012-03-13 DIAGNOSIS — M810 Age-related osteoporosis without current pathological fracture: Secondary | ICD-10-CM | POA: Diagnosis not present

## 2012-03-13 DIAGNOSIS — S0993XA Unspecified injury of face, initial encounter: Secondary | ICD-10-CM | POA: Diagnosis not present

## 2012-03-13 DIAGNOSIS — Z7982 Long term (current) use of aspirin: Secondary | ICD-10-CM

## 2012-03-13 DIAGNOSIS — R4182 Altered mental status, unspecified: Secondary | ICD-10-CM | POA: Diagnosis present

## 2012-03-13 DIAGNOSIS — S3981XA Other specified injuries of abdomen, initial encounter: Secondary | ICD-10-CM | POA: Diagnosis not present

## 2012-03-13 DIAGNOSIS — M1990 Unspecified osteoarthritis, unspecified site: Secondary | ICD-10-CM | POA: Diagnosis present

## 2012-03-13 DIAGNOSIS — J40 Bronchitis, not specified as acute or chronic: Secondary | ICD-10-CM | POA: Diagnosis not present

## 2012-03-13 DIAGNOSIS — E871 Hypo-osmolality and hyponatremia: Secondary | ICD-10-CM | POA: Diagnosis present

## 2012-03-13 DIAGNOSIS — N289 Disorder of kidney and ureter, unspecified: Secondary | ICD-10-CM | POA: Diagnosis present

## 2012-03-13 DIAGNOSIS — R6889 Other general symptoms and signs: Secondary | ICD-10-CM | POA: Diagnosis not present

## 2012-03-13 DIAGNOSIS — R059 Cough, unspecified: Secondary | ICD-10-CM | POA: Diagnosis not present

## 2012-03-13 DIAGNOSIS — K219 Gastro-esophageal reflux disease without esophagitis: Secondary | ICD-10-CM | POA: Diagnosis present

## 2012-03-13 DIAGNOSIS — R5381 Other malaise: Secondary | ICD-10-CM | POA: Diagnosis present

## 2012-03-13 DIAGNOSIS — I4510 Unspecified right bundle-branch block: Secondary | ICD-10-CM | POA: Diagnosis not present

## 2012-03-13 DIAGNOSIS — J9819 Other pulmonary collapse: Secondary | ICD-10-CM | POA: Diagnosis not present

## 2012-03-13 DIAGNOSIS — A419 Sepsis, unspecified organism: Secondary | ICD-10-CM | POA: Diagnosis not present

## 2012-03-13 DIAGNOSIS — S199XXA Unspecified injury of neck, initial encounter: Secondary | ICD-10-CM | POA: Diagnosis not present

## 2012-03-13 DIAGNOSIS — IMO0002 Reserved for concepts with insufficient information to code with codable children: Secondary | ICD-10-CM | POA: Diagnosis not present

## 2012-03-13 DIAGNOSIS — E785 Hyperlipidemia, unspecified: Secondary | ICD-10-CM | POA: Diagnosis present

## 2012-03-13 LAB — HEPATIC FUNCTION PANEL
ALT: 9 U/L (ref 0–35)
Albumin: 3.9 g/dL (ref 3.5–5.2)
Alkaline Phosphatase: 26 U/L — ABNORMAL LOW (ref 39–117)
Total Protein: 7.5 g/dL (ref 6.0–8.3)

## 2012-03-13 LAB — URINALYSIS, ROUTINE W REFLEX MICROSCOPIC
Glucose, UA: NEGATIVE mg/dL
Glucose, UA: NEGATIVE mg/dL
Ketones, ur: NEGATIVE mg/dL
Protein, ur: NEGATIVE mg/dL
Protein, ur: NEGATIVE mg/dL
Urobilinogen, UA: 0.2 mg/dL (ref 0.0–1.0)
Urobilinogen, UA: 0.2 mg/dL (ref 0.0–1.0)

## 2012-03-13 LAB — CK TOTAL AND CKMB (NOT AT ARMC)
Relative Index: 2.3 (ref 0.0–2.5)
Total CK: 244 U/L — ABNORMAL HIGH (ref 7–177)

## 2012-03-13 LAB — URINE MICROSCOPIC-ADD ON

## 2012-03-13 LAB — CBC WITH DIFFERENTIAL/PLATELET
Basophils Relative: 0 % (ref 0–1)
Eosinophils Absolute: 0 10*3/uL (ref 0.0–0.7)
Eosinophils Relative: 0 % (ref 0–5)
Hemoglobin: 11.6 g/dL — ABNORMAL LOW (ref 12.0–15.0)
Lymphs Abs: 0.9 10*3/uL (ref 0.7–4.0)
MCH: 28.2 pg (ref 26.0–34.0)
MCHC: 33.9 g/dL (ref 30.0–36.0)
MCV: 83.2 fL (ref 78.0–100.0)
Monocytes Relative: 4 % (ref 3–12)
Neutrophils Relative %: 92 % — ABNORMAL HIGH (ref 43–77)
RBC: 4.11 MIL/uL (ref 3.87–5.11)

## 2012-03-13 LAB — BASIC METABOLIC PANEL
BUN: 65 mg/dL — ABNORMAL HIGH (ref 6–23)
CO2: 24 mEq/L (ref 19–32)
Calcium: 12.9 mg/dL — ABNORMAL HIGH (ref 8.4–10.5)
Creatinine, Ser: 3.18 mg/dL — ABNORMAL HIGH (ref 0.50–1.10)
GFR calc non Af Amer: 12 mL/min — ABNORMAL LOW (ref >90)
Glucose, Bld: 148 mg/dL — ABNORMAL HIGH (ref 70–99)

## 2012-03-13 LAB — POCT I-STAT, CHEM 8
BUN: 61 mg/dL — ABNORMAL HIGH (ref 6–23)
HCT: 40 % (ref 36.0–46.0)
Sodium: 134 mEq/L — ABNORMAL LOW (ref 135–145)
TCO2: 25 mmol/L (ref 0–100)

## 2012-03-13 LAB — CG4 I-STAT (LACTIC ACID): Lactic Acid, Venous: 1.81 mmol/L (ref 0.5–2.2)

## 2012-03-13 MED ORDER — DOCUSATE SODIUM 100 MG PO CAPS
100.0000 mg | ORAL_CAPSULE | Freq: Two times a day (BID) | ORAL | Status: DC
  Administered 2012-03-13 – 2012-03-15 (×5): 100 mg via ORAL
  Filled 2012-03-13 (×7): qty 1

## 2012-03-13 MED ORDER — PANTOPRAZOLE SODIUM 40 MG PO TBEC
40.0000 mg | DELAYED_RELEASE_TABLET | Freq: Every day | ORAL | Status: DC
  Administered 2012-03-14 – 2012-03-18 (×5): 40 mg via ORAL
  Filled 2012-03-13 (×6): qty 1

## 2012-03-13 MED ORDER — DEXTROSE 5 % IV SOLN
1.0000 g | Freq: Once | INTRAVENOUS | Status: AC
  Administered 2012-03-13: 1 g via INTRAVENOUS
  Filled 2012-03-13: qty 1

## 2012-03-13 MED ORDER — ASPIRIN 81 MG PO CHEW
81.0000 mg | CHEWABLE_TABLET | Freq: Every day | ORAL | Status: DC
  Administered 2012-03-13 – 2012-03-18 (×6): 81 mg via ORAL
  Filled 2012-03-13 (×6): qty 1

## 2012-03-13 MED ORDER — LEVOFLOXACIN IN D5W 750 MG/150ML IV SOLN
750.0000 mg | Freq: Once | INTRAVENOUS | Status: AC
  Administered 2012-03-13: 750 mg via INTRAVENOUS
  Filled 2012-03-13: qty 150

## 2012-03-13 MED ORDER — ACETAMINOPHEN 650 MG RE SUPP: 650.0000 mg | Freq: Four times a day (QID) | RECTAL | Status: DC | PRN

## 2012-03-13 MED ORDER — LEVOFLOXACIN IN D5W 500 MG/100ML IV SOLN
500.0000 mg | INTRAVENOUS | Status: DC
  Administered 2012-03-15: 500 mg via INTRAVENOUS
  Filled 2012-03-13: qty 100

## 2012-03-13 MED ORDER — SODIUM CHLORIDE 0.9 % IV SOLN
1000.0000 mL | Freq: Once | INTRAVENOUS | Status: AC
  Administered 2012-03-13: 1000 mL via INTRAVENOUS

## 2012-03-13 MED ORDER — ONDANSETRON HCL 4 MG/2ML IJ SOLN: 4.0000 mg | Freq: Four times a day (QID) | INTRAMUSCULAR | Status: DC | PRN

## 2012-03-13 MED ORDER — ACETAMINOPHEN 325 MG PO TABS
650.0000 mg | ORAL_TABLET | Freq: Four times a day (QID) | ORAL | Status: DC | PRN
  Administered 2012-03-14 – 2012-03-18 (×4): 650 mg via ORAL
  Filled 2012-03-13 (×4): qty 2

## 2012-03-13 MED ORDER — ONDANSETRON HCL 4 MG/2ML IJ SOLN
4.0000 mg | Freq: Once | INTRAMUSCULAR | Status: AC
  Administered 2012-03-13: 4 mg via INTRAVENOUS
  Filled 2012-03-13: qty 2

## 2012-03-13 MED ORDER — AZTREONAM 2 G IJ SOLR: 2.0000 g | Freq: Once | INTRAMUSCULAR | Status: DC

## 2012-03-13 MED ORDER — ASPIRIN 81 MG PO TABS: 81.0000 mg | ORAL_TABLET | Freq: Every day | ORAL | Status: DC

## 2012-03-13 MED ORDER — VANCOMYCIN HCL IN DEXTROSE 1-5 GM/200ML-% IV SOLN
1000.0000 mg | Freq: Once | INTRAVENOUS | Status: AC
  Administered 2012-03-13: 1000 mg via INTRAVENOUS
  Filled 2012-03-13: qty 200

## 2012-03-13 MED ORDER — ONDANSETRON HCL 4 MG PO TABS: 4.0000 mg | ORAL_TABLET | Freq: Four times a day (QID) | ORAL | Status: DC | PRN

## 2012-03-13 MED ORDER — SODIUM CHLORIDE 0.9 % IV SOLN
INTRAVENOUS | Status: DC
  Administered 2012-03-15 – 2012-03-16 (×3): via INTRAVENOUS

## 2012-03-13 MED ORDER — ENOXAPARIN SODIUM 30 MG/0.3ML ~~LOC~~ SOLN
30.0000 mg | SUBCUTANEOUS | Status: DC
  Administered 2012-03-13 – 2012-03-17 (×5): 30 mg via SUBCUTANEOUS
  Filled 2012-03-13 (×6): qty 0.3

## 2012-03-13 MED ORDER — CALCIUM CARBONATE-VITAMIN D 500-200 MG-UNIT PO TABS
1.0000 | ORAL_TABLET | Freq: Every day | ORAL | Status: DC
  Administered 2012-03-13 – 2012-03-15 (×3): 1 via ORAL
  Filled 2012-03-13 (×4): qty 1

## 2012-03-13 MED ORDER — SODIUM CHLORIDE 0.9 % IV SOLN
1000.0000 mL | INTRAVENOUS | Status: DC
  Administered 2012-03-13 – 2012-03-14 (×3): 1000 mL via INTRAVENOUS

## 2012-03-13 MED ORDER — VANCOMYCIN HCL 1000 MG IV SOLR
750.0000 mg | INTRAVENOUS | Status: DC
  Administered 2012-03-15: 750 mg via INTRAVENOUS
  Filled 2012-03-13: qty 750

## 2012-03-13 MED ORDER — ZOLPIDEM TARTRATE 5 MG PO TABS
5.0000 mg | ORAL_TABLET | Freq: Every evening | ORAL | Status: DC | PRN
  Administered 2012-03-13 – 2012-03-17 (×5): 5 mg via ORAL
  Filled 2012-03-13 (×5): qty 1

## 2012-03-13 MED ORDER — POLYETHYLENE GLYCOL 3350 17 G PO PACK
17.0000 g | PACK | Freq: Every day | ORAL | Status: DC | PRN
  Filled 2012-03-13: qty 1

## 2012-03-13 MED ORDER — DEXTROSE 5 % IV SOLN
500.0000 mg | Freq: Three times a day (TID) | INTRAVENOUS | Status: DC
  Administered 2012-03-13 – 2012-03-16 (×8): 500 mg via INTRAVENOUS
  Filled 2012-03-13 (×10): qty 0.5

## 2012-03-13 MED ORDER — SODIUM CHLORIDE 0.9 % IJ SOLN
3.0000 mL | Freq: Two times a day (BID) | INTRAMUSCULAR | Status: DC
  Administered 2012-03-14 – 2012-03-15 (×4): 3 mL via INTRAVENOUS

## 2012-03-13 NOTE — ED Provider Notes (Signed)
History     CSN: [id number]  Arrival date & time 03/13/12  1018   First MD Initiated Contact with Patient 03/13/12 1042      Chief Complaint  Patient presents with  . Fall    (Consider location/radiation/quality/duration/timing/severity/associated sxs/prior treatment) The history is provided by a relative and medical records.   Mallory Jensen is 79 year old female who presents to the emergency department with fall and altered mental status.  She is accompanied by her daughter who gives the history.  As a level V caveat due to altered mental status.  Daughter states that she found her mother on the floor by her bed this morning at approximately 9:30 AM.  Patient lives at home by herself and is generally able to perform ADLs unassisted and has normal mental status.  Daughter states the chief thinks her mother fell sometime last night was on the floor all night long.  She has a history of falls in the past and uses a walker at home. Daughter states that the patient has a past history of compression fracture to the thoracic spine.  She has had a cough over the past week it has been complaining of poor oral intake and jitteriness.  She was seen by her primary care physician Dr. Wylene Simmer on the 03/13/2012 . I spoke with his nurse who states that she was started on fenofibrate and on other meds.   Past Medical History  Diagnosis Date  . HTN (hypertension)   . Hypercholesteremia   . RBBB   . Mild aortic stenosis   . Chronic fatigue   . Compression fracture of vertebral column   . Ovarian cyst     Past Surgical History  Procedure Date  . Cholecystectomy   . Appendectomy   . Cesarean section   . Athroscopy right shoulder   . Biopsy left breast x 2     Family History  Problem Relation Age of Onset  . Cancer    . Heart disease    . Heart disease Brother   . Heart disease Brother     History  Substance Use Topics  . Smoking status: Never Smoker   . Smokeless tobacco: Not on file   . Alcohol Use: No    OB History    Grav Para Term Preterm Abortions TAB SAB Ect Mult Living                  Review of Systems  Unable to perform ROS: Mental status change    Allergies  Codeine and Penicillins  Home Medications   Current Outpatient Rx  Name  Route  Sig  Dispense  Refill  . AMLODIPINE BESYLATE 5 MG PO TABS   Oral   Take 5 mg by mouth daily.         . ASPIRIN 81 MG PO TABS   Oral   Take 81 mg by mouth daily.         Marland Kitchen CALCIUM CARBONATE-VITAMIN D 500-200 MG-UNIT PO TABS   Oral   Take 1 tablet by mouth daily.         . FENOFIBRATE 145 MG PO TABS   Oral   Take 145 mg by mouth daily.         . OMEGA-3 FATTY ACIDS 1000 MG PO CAPS   Oral   Take by mouth daily.         . FUROSEMIDE 20 MG PO TABS   Oral   Take 20 mg by mouth  daily as needed. For edema         . GLUCOSAMINE-CHONDROITIN 500-400 MG PO TABS   Oral   Take 1 tablet by mouth daily after supper.         Marland Kitchen LOSARTAN POTASSIUM-HCTZ 100-25 MG PO TABS      1 tab  daily         . ADULT MULTIVITAMIN W/MINERALS CH   Oral   Take 1 tablet by mouth daily.         Marland Kitchen NAPROXEN SODIUM 220 MG PO TABS   Oral   Take 220 mg by mouth 2 (two) times daily as needed. pain         . RANITIDINE HCL 75 MG PO TABS   Oral   Take 75 mg by mouth every morning.           BP 109/76  Pulse 75  Temp 94.5 F (34.7 C) (Rectal)  Resp 18  SpO2 96%  Physical Exam  Nursing note and vitals reviewed. Constitutional:       Elderly female NAD  HENT:  Head: Normocephalic and atraumatic.  Eyes: EOM are normal. Pupils are equal, round, and reactive to light.  Neck: No tracheal deviation present.  Cardiovascular: Normal rate, regular rhythm and intact distal pulses.   No murmur heard. Pulmonary/Chest: Effort normal and breath sounds normal. No respiratory distress. She has no wheezes.  Abdominal: Soft. She exhibits no distension. There is tenderness (BL UQs).  Lymphadenopathy:    She has  no cervical adenopathy.  Neurological: She is alert.    ED Course  Procedures (including critical care time) CRITICAL CARE Performed by: Arthor Captain   Total critical care time: 30 minutes  Critical care time was exclusive of separately billable procedures and treating other patients.  Critical care was necessary to treat or prevent imminent or life-threatening deterioration.  Critical care was time spent personally by me on the following activities: development of treatment plan with patient and/or surrogate as well as nursing, discussions with consultants, evaluation of patient's response to treatment, examination of patient, obtaining history from patient or surrogate, ordering and performing treatments and interventions, ordering and review of laboratory studies, ordering and review of radiographic studies, pulse oximetry and re-evaluation of patient's condition.  Labs Reviewed  CBC WITH DIFFERENTIAL - Abnormal; Notable for the following:    WBC 24.0 (*)     Hemoglobin 11.6 (*)     HCT 34.2 (*)     Neutrophils Relative 92 (*)     Neutro Abs 22.1 (*)     Lymphocytes Relative 4 (*)     Monocytes Absolute 1.1 (*)     All other components within normal limits  URINALYSIS, ROUTINE W REFLEX MICROSCOPIC  BASIC METABOLIC PANEL  URINALYSIS, ROUTINE W REFLEX MICROSCOPIC   No results found.  Date: 03/13/2012  Rate: 72  Rhythm: normal sinus rhythm  QRS Axis: normal  Intervals: normal  ST/T Wave abnormalities: normal  Conduction Disutrbances:right bundle branch block  Narrative Interpretation:   Old EKG Reviewed: unchanged    No diagnosis found.    MDM  12:35 PM BP 141/51  Pulse 69  Temp 94.5 F (34.7 C) (Rectal)  Resp 18  SpO2 96%' Patient with AMS and un witnessed fall. she has a history of thoracic compression fracture and has had a cough over the past week per her daughter.  The patient has a rectal temp of 94 5 F which was verified and rechecked twice.  She  can't have we have a bear hugger.  Lactate is 1.81 White count of 24.  Patient appears to have SIRS.  Her last BUN and creatinine in June of 2013 was 16 and 1.00 respectively.  She now had a BUN of 64 and a creatinine of 3.14.  Patient seen and shared visit with Dr. Ethelda Chick.   I spoke with Dr. Wylene Simmer. Patient has had septra x 1 week for URI complaints.  She finished yesterday and was seen at his office.  Labs are still pending except for a WBC count of 12,000 yesterday.  Patient treated here for sepsis with unkown source (vanc, aztreonam, and levaquin) she continues to have AMS imaging is negative.  Patient will be admitted by [facility]. The patient appears reasonably stabilized for admission considering the current resources, flow, and capabilities available in the ED at this time, and I doubt any other [facility] requiring further screening and/or treatment in the ED prior to admission.      Arthor Captain, PA-C 03/13/12 1731

## 2012-03-13 NOTE — ED Notes (Signed)
Lactic acid results given to Dr. Jacubowitz 

## 2012-03-13 NOTE — ED Notes (Signed)
To ED via [facility], pt from home, c/o fell last night, unsure of what time, fell in bedroom, was found on floor this am by family, assisted to bathroom, when EMS arrived, was in living room in a chair.

## 2012-03-13 NOTE — Progress Notes (Signed)
ANTIBIOTIC CONSULT NOTE - INITIAL  Pharmacy Consult for Vancomycin, Levaquin and Aztreonam Indication: rule out sepsis  Allergies  Allergen Reactions  . Codeine Other (See Comments)    UNKNOWN  . Penicillins Other (See Comments)    rash    Patient Measurements: Height: 5' (152.4 cm) Weight: 127 lb 12.8 oz (57.97 kg) (per family) IBW/kg (Calculated) : 45.5   Vital Signs: Temp: 94.5 F (34.7 C) (01/31 1033) Temp src: Rectal (01/31 1033) BP: 141/51 mmHg (01/31 1130) Pulse Rate: 69  (01/31 1130)  Labs:  Basename 03/13/12 1202 03/13/12 1056  WBC -- 24.0*  HGB 13.6 11.6*  PLT -- 299  LABCREA -- --  CREATININE 3.70* 3.18*   Estimated Creatinine Clearance: 8.5 ml/min (by C-G formula based on Cr of 3.7).  Microbiology:  1/31 - blood and urine cultures ordered  Medical History: Past Medical History  Diagnosis Date  . HTN (hypertension)   . Hypercholesteremia   . RBBB   . Mild aortic stenosis   . Chronic fatigue   . Compression fracture of vertebral column   . Ovarian cyst    Assessment:  79 yr old female in ED after fall and AMS.  Found by daughter this morning.  Daughter reports completion of 7-day antibiotic course on 1/30. Confirmed Septra DS 1 tablet BID prescribed 03/05/12 at Dr. Deneen Harts office. Cough over the last week, poor PO intake noted. Now with Scr up to 3.7.  Previously Scr 1.o in July 2013. Increased BUN/Cr could be due to poor PO intake while on Septra.  Goal of Therapy:  Vancomycin trough level 15-20 mcg/ml Appropriate doses for renal function & infection  Plan:   Loading doses ordered in ED:   Vancomycin 1 gram IV, Azactam 1gram IV and Levaquin 750 mg IV.   Maintenance regimens:      Vancomycin 750 mg IV q48hrs, Azactam 500 mg IV q8hrs and Levaquin 500 mg IV q48hrs.    Will follow up renal function for any need to adjust regimens.  Follow up culture data and clinical status.  Dennie Fetters, [place] Page: [phone] 03/13/2012,1:19 PM

## 2012-03-13 NOTE — H&P (Signed)
PCP:   Mallory Garbe, MD   Chief Complaint:  S/P Fall, confusion  HPI: Patient is an 79 year old female with recent history of thoracic compression fracture who has not been doing well over the past 3 months.  She has been seen several times as a work in at our office at [employer] and was last seen yesterday.  She complained of feeling jittery and just not herself.  Despite being an accomplished chef, she is not really eating well and sometimes will skip meals.  Because of that, she has had considerable weight loss.  She also recently underwent a thorough evaluation regarding an area seen on an ovary which was deemed to be benign.  Labs were ordered yesterday, which just came back this afternoon.  Last night she had a fall of sorts and was found by her family this morning.  She seemed confused and was brought to the ER.  Trauma eval was negative, but her temp was 11F rectally and her WBC was 24.  She was 12 the prior day.  Evaluation is as below.  She is a bit confused on the prior day's events and had difficulty recognizing me, even though I have seen her for over a decade now.  Her daughter notes a considerable difference from when she was in the office to when she found her at home this morning.  Of note, Mallory Jensen has a LifeAlert system, but did not use it.  Review of Systems:  Review of Systems - She is confused and will only say that she feels jittery, otherwise unobtainable.  From yesterday:  Review of Systems  General:       Complains of weight loss.        Denies fevers, chills, sweats.   Eyes:       Denies blurring, diplopia.   Ears/Nose/Throat:       Denies earache, ear discharge, nosebleeds, sore throat.   Cardiovascular:       Denies chest pains, palpitations.   Respiratory:       Denies cough, dyspnea, excessive sputum.   Gastrointestinal:       Denies nausea, vomiting, diarrhea, constipation.   Genitourinary:       Denies incontinence.   Musculoskeletal:        Complains of arthritis.   Neurologic:       Complains of see HPI, tremors.        Denies weakness.   Psychiatric:       Complains of see HPI, anxiety.   Endocrine:       Denies cold intolerance, heat intolerance.    Past Medical History: Past Medical History  Diagnosis Date  . HTN (hypertension)   . Hypercholesteremia   . RBBB   . Mild aortic stenosis   . Chronic fatigue   . Compression fracture of vertebral column   . Ovarian cyst    Past Surgical History  Procedure Date  . Cholecystectomy   . Appendectomy   . Cesarean section   . Athroscopy right shoulder   . Biopsy left breast x 2     Medications: Prior to Admission medications   Medication Sig Start Date End Date Taking? Authorizing Provider  amLODipine (NORVASC) 5 MG tablet Take 5 mg by mouth daily.   Yes Historical Provider, MD  aspirin 81 MG tablet Take 81 mg by mouth daily.   Yes Historical Provider, MD  calcium-vitamin D (OSCAL WITH D) 500-200 MG-UNIT per tablet Take 1 tablet by mouth daily.  Yes Historical Provider, MD  fenofibrate (TRICOR) 145 MG tablet Take 145 mg by mouth daily.   Yes Historical Provider, MD  fish oil-omega-3 fatty acids 1000 MG capsule Take by mouth daily.   Yes Historical Provider, MD  furosemide (LASIX) 20 MG tablet Take 20 mg by mouth daily as needed. For edema   Yes Historical Provider, MD  glucosamine-chondroitin 500-400 MG tablet Take 1 tablet by mouth daily after supper.   Yes Historical Provider, MD  losartan-hydrochlorothiazide (HYZAAR) 100-25 MG per tablet 1 tab  daily 05/29/11  Yes Historical Provider, MD  Multiple Vitamin (MULTIVITAMIN WITH MINERALS) TABS Take 1 tablet by mouth daily.   Yes Historical Provider, MD  naproxen sodium (ANAPROX) 220 MG tablet Take 220 mg by mouth 2 (two) times daily as needed. pain   Yes Historical Provider, MD  ranitidine (ZANTAC) 75 MG tablet Take 75 mg by mouth every morning.   Yes Historical Provider, MD    Allergies:   Allergies  Allergen  Reactions  . Codeine Other (See Comments)    UNKNOWN  . Penicillins Other (See Comments)    rash    Social History:  reports that she has never smoked. She does not have any smokeless tobacco history on file. She reports that she does not drink alcohol or use illicit drugs.  Family History: Family History  Problem Relation Age of Onset  . Cancer    . Heart disease    . Heart disease Brother   . Heart disease Brother     Physical Exam: Filed Vitals:   03/13/12 1315 03/13/12 1330 03/13/12 1345 03/13/12 1400  BP: 133/75 139/45 130/78 146/55  Pulse: 77 79 80 83  Temp: 96.6 F (35.9 C) 96.8 F (36 C) 97.2 F (36.2 C) 97.3 F (36.3 C)  TempSrc:      Resp: 18 21 20 20   Height:      Weight:      SpO2: 96% 96% 94% 95%   General appearance: alert, cooperative and appears stated age Head: Normocephalic, without obvious abnormality, atraumatic Eyes: conjunctivae/corneas clear. PERRL, EOM's intact.  Nose: Nares normal. Septum midline. Mucosa normal. No drainage or sinus tenderness. Throat: lips, mucosa, and tongue normal; teeth and gums normal Neck: no adenopathy, no carotid bruit, no JVD and thyroid not enlarged, symmetric, no tenderness/mass/nodules Resp: clear to auscultation bilaterally Cardio: regular rate and rhythm, S1, S2 normal, no murmur, click, rub or gallop GI: soft, mild upper quadrant tenderness; bowel sounds normal; no masses,  no organomegaly Extremities: extremities normal, atraumatic, no cyanosis or edema Pulses: 2+ and symmetric Lymph nodes: Cervical adenopathy: no cervical lymphadenopathy Neurologic: Confused, poor strength and tone. Normal symmetric reflexes.     Labs on Admission:   [facility] 03/13/12 1202 03/13/12 1056  NA 134* 133*  K 5.1 5.3*  CL 104 97  CO2 -- 24  GLUCOSE 143* 148*  BUN 61* 65*  CREATININE 3.70* 3.18*  CALCIUM -- 12.9*  MG -- --  PHOS -- --    Basename 03/13/12 1056  AST 25  ALT 9  ALKPHOS 26*  BILITOT 0.3  PROT 7.5   ALBUMIN 3.9    Basename 03/13/12 1202 03/13/12 1056  WBC -- 24.0*  NEUTROABS -- 22.1*  HGB 13.6 11.6*  HCT 40.0 34.2*  MCV -- 83.2  PLT -- 299    Basename 03/13/12 1056  CKTOTAL 244*  CKMB 5.5*  CKMBINDEX --  TROPONINI --    Prior day labs: (outpatient) Tests: (1) Vitamin B12 (409811)  Vitamin B12               461 pg/mL                   211-946  Tests: (2) Creatine Kinase,Total,Serum [phone]) ! Creatine Kinase,Total,Serum                             49 U/L                      24-173  Tests: (3) COMPLETE METABOLIC (1010)   GLUCOSE              [H]  114 mg/dl                   09-811   BUN                  [H]  56 mg/dl                    9-14   CREATININE           [H]  2.7 mg/dl                   7.8-2.9  eGFR Non-African American                             16.7  eGFR African American                             20.2   SODIUM                    135 mEq/L                   135-148   POTASSIUM            [HH] 6.4 mEq/L                   3.5-5.3     RES=RESULT VERIFIED AND REPORTED TO PHYSICIAN   CHLORIDE                  100 mEq/L                   80-111   CO2                       23 mEq/L                    15-35   CALCIUM              [HH] 14.1 mg/dL                  5.6-21.3     RES=RESULT VERIFIED AND REPORTED TO PHYSICIAN   TOTAL PROTEIN             7.6 g/dL                    0.8-6.5   ALBUMIN                   4.3 g/dL                    7.8-4.6   AST  21 IU/L                     7-45   ALT                       10 IU/L                     5-40   ALK PHOS             [L]  27 IU/L                     37-137   TOTAL BILIRUBIN           0.2 mg/dl                   1.6-1.0  Tests: (4) CBC (2000)   WBC                  [H]  12.00 K/uL                  4.10-10.90   LYM                       1.6 K/uL                    0.6-4.1 ! MID                       1.0 K/uL                    0.0-1.8   GRAN                 [H]  9.4 K/uL                     2.0-7.8   LYM%                      13.6 %                      10.0-58.5 ! MID%                      8.0 %                       0.1-24.0   GRAN%                     78.4                        37.0-92.0   RBC                       4.3 M/uL                    4.2-6.3   HGB                       12.9 g/dL                   96.0-45.4   HCT                       37.1 %  37.0-51.0   MCV                       86.1 fL                     80.0-97.0   MCH                       29.9 pg                     26.0-32.0   MCHC                      34.8 g/dL                   16.1-09.6   PLT                       395 K/uL                    140-440  Tests: (5) LIPID (2105)   CHOLESTEROL               150 mg/dl                   045-409   TRIGLYCERIDES        [H]  168 mg/dl                   81-191   HDL                  [L]  28 mg/dl                    47-82   LDL                       88 mg/dl   CHOL/HDL RATIO            5.4 RATIO        LDL/HDL RATIO        [H]  3.1                         1.7-2.5   NON-HDL                   122.0  Tests: (6) TSH (3110)   TSH                       2.54 uIU/ml                 0.40-4.20  Tests: (7) VITAMIN D (3125)   VITAMIN D                 52.2 ng/mL                  30.0-100.0  Radiological Exams on Admission: Ct Abdomen Pelvis Wo Contrast  03/13/2012  *RADIOLOGY REPORT*  Clinical Data: Post fall  CT ABDOMEN AND PELVIS WITHOUT CONTRAST  Technique:  Multidetector CT imaging of the abdomen and pelvis was performed following the standard protocol without intravenous contrast.  Comparison: Lumbar spine radiographs - 08/27/2011  Findings:  The lack of intravenous contrast limits the ability to evaluate solid abdominal organs.  Normal hepatic contour.  Post cholecystectomy.  No ascites.  There is a sub centimeter (  approximately 8 mm) exophytic lesion arising from the inferior pole right kidney (image 41, series 2) which is likely too small  to accurately characterize.  No renal stones.  There is minimal calcification about the left renal hilum which is likely vascular in etiology.  No urinary obstruction or perinephric stranding.  Normal noncontrast appearance of the bilateral adrenal glands and spleen.   Note is made of rather diffuse mesenteric stranding however this appears to be centered regional to the pancreas. No drainable fluid collection.  Moderate to large stool burden in the rectal vault.  Colonic diverticulosis without noncontrast evidence of diverticulitis. Radiopaque material is seen within a likely duodenal diverticulum. The bowel otherwise normal in course and caliber without wall thickening or evidence of obstruction.  No pneumoperitoneum, pneumatosis or portal venous gas.  Scattered atherosclerotic plaque within a mildly cat hepatic infrarenal abdominal aorta measuring approximately 2.3 x 2.2 cm in diameter.  No definite retroperitoneal, mesenteric, pelvic or inguinal lymphadenopathy on this noncontrast examination.  Post hysterectomy.  Note is made of an approximately 2.9 x 1.8 cm right-sided adnexal cyst (image 65, series 2).  Limited visualization of the lower thorax demonstrates minimal subpleural ground-glass opacities favored to represent atelectasis. No focal airspace opacity or pleural effusion.  Normal heart size.  Extensive coronary artery calcifications.  No pericardial effusion.  Likely interval progression of now severe (greater than 75%) compression deformity of the T12 vertebral body.  There is approximately 6 mm of retro listhesis of the posterior inferior endplate of the T12 vertebral body towards the spinal canal.  There is minimal (approximately 4 mm) of anterolisthesis of L4 upon L5. Mild to moderate multilevel lumbar spine DDD, worse at L1 - L2-L4 - L5 with disc space height loss, end plate irregularity and posterior disc osteophyte complexes at these levels.  IMPRESSION:  1.  Nonspecific rather diffuse mesenteric  stranding which appears somewhat centered about the pancreas - correlation with amylase and lipase levels is recommended. 2.  Interval progression of now severe (> 75%) compression deformity of the T12 vertebral body with approximately 6 mm of retrolisthesis of the posterior inferior endplate of T12 towards the spinal canal.  As an acute on chronic injury is not excluded, correlation for point tenderness at this location is recommended.  3.  Approximately 2.9 x 1.8 cm right-sided cystic adnexal lesion, an abnormal finding in this postmenopausal patient. 4.  Colonic diverticulosis without evidence of diverticulitis.  5.  Extensive coronary artery calcifications.   Original Report Authenticated By: Tacey Ruiz, MD    Ct Head Wo Contrast  03/13/2012  *RADIOLOGY REPORT*  Clinical Data:  Fall  CT HEAD WITHOUT CONTRAST CT CERVICAL SPINE WITHOUT CONTRAST  Technique:  Multidetector CT imaging of the head and cervical spine was performed following the standard protocol without intravenous contrast.  Multiplanar CT image reconstructions of the cervical spine were also generated.  Comparison:  08/17/2011  CT HEAD  Findings: No skull fracture is noted.  There is mucosal thickening bilateral maxillary sinus.  The mastoid air cells are unremarkable.  No intracranial hemorrhage, mass effect or midline shift.  Stable cerebral atrophy.  Stable periventricular and patchy subcortical white matter decreased attenuation consistent with chronic small vessel ischemic changes.  No acute infarction.  No mass lesion is noted on this unenhanced scan.  IMPRESSION: No acute intracranial abnormality.  Stable atrophy and chronic white matter disease.  Bilateral maxillary sinus mucosal thickening.  CT CERVICAL SPINE  Findings: Axial images of the cervical spine shows no  acute fracture or subluxation.  Computer processed images shows posterior disc bulge and calcification of posterior longitudinal ligament at C2-C3 C3-C4 C4-C5 C5-C6 and C 67  level.  There is disc space flattening with vacuum disc phenomenon at C5-C6 and C6-7 level. Mild anterior spurring noted at C4-C5 C5-C6 and C 67 level.  No prevertebral soft tissue swelling.  Degenerative changes are noted C1-C2 articulation.  IMPRESSION: No acute fracture or subluxation.  Multilevel degenerative changes as described above.   Original Report Authenticated By: Natasha Mead, M.D.    Ct Cervical Spine Wo Contrast  03/13/2012  *RADIOLOGY REPORT*  Clinical Data:  Fall  CT HEAD WITHOUT CONTRAST CT CERVICAL SPINE WITHOUT CONTRAST  Technique:  Multidetector CT imaging of the head and cervical spine was performed following the standard protocol without intravenous contrast.  Multiplanar CT image reconstructions of the cervical spine were also generated.  Comparison:  08/17/2011  CT HEAD  Findings: No skull fracture is noted.  There is mucosal thickening bilateral maxillary sinus.  The mastoid air cells are unremarkable.  No intracranial hemorrhage, mass effect or midline shift.  Stable cerebral atrophy.  Stable periventricular and patchy subcortical white matter decreased attenuation consistent with chronic small vessel ischemic changes.  No acute infarction.  No mass lesion is noted on this unenhanced scan.  IMPRESSION: No acute intracranial abnormality.  Stable atrophy and chronic white matter disease.  Bilateral maxillary sinus mucosal thickening.  CT CERVICAL SPINE  Findings: Axial images of the cervical spine shows no acute fracture or subluxation.  Computer processed images shows posterior disc bulge and calcification of posterior longitudinal ligament at C2-C3 C3-C4 C4-C5 C5-C6 and C 67 level.  There is disc space flattening with vacuum disc phenomenon at C5-C6 and C6-7 level. Mild anterior spurring noted at C4-C5 C5-C6 and C 67 level.  No prevertebral soft tissue swelling.  Degenerative changes are noted C1-C2 articulation.  IMPRESSION: No acute fracture or subluxation.  Multilevel degenerative  changes as described above.   Original Report Authenticated By: Natasha Mead, M.D.    Dg Chest Port 1 View  03/13/2012  *RADIOLOGY REPORT*  Clinical Data: Altered mental status  PORTABLE CHEST - 1 VIEW  Comparison: 04/23/2004  Findings: Cardiomediastinal silhouette is stable.  No acute infiltrate or pleural effusion.  No pulmonary edema.  Central mild bronchitic changes again noted.  IMPRESSION: No active disease.  No significant change.   Original Report Authenticated By: Natasha Mead, M.D.    Orders placed during the hospital encounter of 03/13/12  . EKG 12-LEAD  . EKG 12-LEAD    Assessment/Plan 1) Probable sepsis-  She has had a sharp increase in her WBC with left shift.  U/A and CXR unremarkable for cause.  CT of her abdomen shows some inflammation near her pancreas.  Amylase and Lipase are pending.  May consider CA 19-9, but no discrete mass noted.  Might also reflect some of her weight loss , but she had noted no abdominal pain and just constipation.  Vanc/Aztreonam and Levaquin given in ER, will continue and follow cultures. Normal lactate 2) Hyperlipidemia-  Will hold her fibrate (not on a different drug per ER notes, just generic) as associated with gallstones 3) HTN Will D/C all BP meds at this point.  She is dehydrated and has increased renal indices, so the Hyzaar needs to be held. 4) Acute renal insufficiency-  IV fluids, BP meds held as above.  I+O's, foley.  No Nsaids, Pharmacy adjusting antibiotics. 5)  Constipation:  Bowel regimen. 6)  GERD- PPI 7) Thoracic back fracture.  Given her hypercalcemia, may need Myeloma workup, although initial imaging did not suggest tumor in that area.  Will reassess post hydration and order SPEP and UPEP as well. 8) Hypercalcemia- As noted above.  Also will check PTH and PTH related protein  Mallory Jensen W 03/13/2012, 4:01 PM

## 2012-03-13 NOTE — ED Provider Notes (Signed)
Patient fell on floor approximately 11 PM yesterday. She was unable to get up for several hours and her daughter found her at 7 AM today. She presently complains of back pain denies other complaints she has vomited multiple times since the fall. On exam patient is alert follows some commands moves all extremities lungs clear auscultation heart regular rate and rhythm abdomen tender at epigastrium pelvis stable nontender  Doug Sou, MD 03/13/12 1437

## 2012-03-14 ENCOUNTER — Inpatient Hospital Stay (HOSPITAL_COMMUNITY): Payer: Medicare Other

## 2012-03-14 DIAGNOSIS — N281 Cyst of kidney, acquired: Secondary | ICD-10-CM | POA: Diagnosis not present

## 2012-03-14 DIAGNOSIS — S22009A Unspecified fracture of unspecified thoracic vertebra, initial encounter for closed fracture: Secondary | ICD-10-CM | POA: Diagnosis not present

## 2012-03-14 DIAGNOSIS — I10 Essential (primary) hypertension: Secondary | ICD-10-CM | POA: Diagnosis not present

## 2012-03-14 DIAGNOSIS — R4182 Altered mental status, unspecified: Secondary | ICD-10-CM | POA: Diagnosis not present

## 2012-03-14 DIAGNOSIS — K859 Acute pancreatitis without necrosis or infection, unspecified: Secondary | ICD-10-CM | POA: Diagnosis not present

## 2012-03-14 LAB — CBC
Hemoglobin: 10.6 g/dL — ABNORMAL LOW (ref 12.0–15.0)
MCH: 28.8 pg (ref 26.0–34.0)
MCV: 84 fL (ref 78.0–100.0)
RBC: 3.68 MIL/uL — ABNORMAL LOW (ref 3.87–5.11)
WBC: 19.9 10*3/uL — ABNORMAL HIGH (ref 4.0–10.5)

## 2012-03-14 LAB — COMPREHENSIVE METABOLIC PANEL
AST: 29 U/L (ref 0–37)
Albumin: 2.7 g/dL — ABNORMAL LOW (ref 3.5–5.2)
BUN: 57 mg/dL — ABNORMAL HIGH (ref 6–23)
Calcium: 9.1 mg/dL (ref 8.4–10.5)
Creatinine, Ser: 2.33 mg/dL — ABNORMAL HIGH (ref 0.50–1.10)
Total Protein: 5.7 g/dL — ABNORMAL LOW (ref 6.0–8.3)

## 2012-03-14 LAB — AMYLASE: Amylase: 1313 U/L — ABNORMAL HIGH (ref 0–105)

## 2012-03-14 LAB — CANCER ANTIGEN 19-9: CA 19-9: 82.6 U/mL — ABNORMAL HIGH (ref <35.0)

## 2012-03-14 LAB — LIPASE, BLOOD: Lipase: 996 U/L — ABNORMAL HIGH (ref 11–59)

## 2012-03-14 LAB — URINE CULTURE

## 2012-03-14 MED ORDER — WHITE PETROLATUM GEL
Status: AC
  Administered 2012-03-14: 12:00:00
  Filled 2012-03-14: qty 5

## 2012-03-14 NOTE — Progress Notes (Signed)
Subjective: Mallory Jensen was admitted by me yesterday afternoon.  Per Epic, orders were placed for add on Lipase and Amylase, but despite being listed, were never actually done until I ordered them late yesterday.  Both are elevated and consistent with Pancreatitis.  She remains confused, as she was the prior day.  Her temperature is better however.  She remains on antibiotics and despite having a clear liquid diet, really is not even attempting to eat.  I had a long discussion with her daughter at the time of admission about suspecting pancreatitis.  She is not present this morning.  Objective: Vital signs in last 24 hours: Temp:  [94.5 F (34.7 C)-98.2 F (36.8 C)] 98.1 F (36.7 C) (02/01 0612) Pulse Rate:  [56-94] 91  (02/01 0612) Resp:  [13-21] 20  (02/01 0612) BP: (109-159)/(44-78) 128/70 mmHg (02/01 0612) SpO2:  [92 %-98 %] 95 % (02/01 0612) Weight:  [54.6 kg (120 lb 5.9 oz)-57.97 kg (127 lb 12.8 oz)] 54.6 kg (120 lb 5.9 oz) (01/31 1606) Weight change:  Last BM Date: 03/13/12  Intake/Output from previous day: 01/31 0701 - 02/01 0700 In: 2464.2 [P.O.:160; I.V.:2054.2; IV Piggyback:250] Out: 700 [Urine:700] Intake/Output this shift:    General appearance: appears stated age, delirious and distracted Neck: no adenopathy, no carotid bruit, no JVD, supple, symmetrical, trachea midline and thyroid not enlarged, symmetric, no tenderness/mass/nodules Resp: clear to auscultation bilaterally Cardio: regular rate and rhythm, S1, S2 normal, no murmur, click, rub or gallop GI: soft, mild epigastric tenderness; bowel sounds normal; no masses,  no organomegaly Extremities: extremities normal, atraumatic, no cyanosis or edema Skin: Skin color, texture, turgor normal. No rashes or lesions Neurologic: Confused, does not remember who I am or time/circumstance, unchanged from prior day.  Lab Results:  Basename 03/14/12 0620 03/13/12 1202 03/13/12 1056  WBC 19.9* -- 24.0*  HGB 10.6* 13.6 --  HCT  30.9* 40.0 --  PLT 234 -- 299   BMET  Basename 03/14/12 0620 03/13/12 1202 03/13/12 1056  NA 136 134* --  K 4.8 5.1 --  CL 108 104 --  CO2 21 -- 24  GLUCOSE 68* 143* --  BUN 57* 61* --  CREATININE 2.33* 3.70* --  CALCIUM 9.1 -- 12.9*    Studies/Results: Ct Abdomen Pelvis Wo Contrast  03/13/2012  *RADIOLOGY REPORT*  Clinical Data: Post fall  CT ABDOMEN AND PELVIS WITHOUT CONTRAST  Technique:  Multidetector CT imaging of the abdomen and pelvis was performed following the standard protocol without intravenous contrast.  Comparison: Lumbar spine radiographs - 08/27/2011  Findings:  The lack of intravenous contrast limits the ability to evaluate solid abdominal organs.  Normal hepatic contour.  Post cholecystectomy.  No ascites.  There is a sub centimeter (approximately 8 mm) exophytic lesion arising from the inferior pole right kidney (image 41, series 2) which is likely too small to accurately characterize.  No renal stones.  There is minimal calcification about the left renal hilum which is likely vascular in etiology.  No urinary obstruction or perinephric stranding.  Normal noncontrast appearance of the bilateral adrenal glands and spleen.   Note is made of rather diffuse mesenteric stranding however this appears to be centered regional to the pancreas. No drainable fluid collection.  Moderate to large stool burden in the rectal vault.  Colonic diverticulosis without noncontrast evidence of diverticulitis. Radiopaque material is seen within a likely duodenal diverticulum. The bowel otherwise normal in course and caliber without wall thickening or evidence of obstruction.  No pneumoperitoneum, pneumatosis or portal  venous gas.  Scattered atherosclerotic plaque within a mildly cat hepatic infrarenal abdominal aorta measuring approximately 2.3 x 2.2 cm in diameter.  No definite retroperitoneal, mesenteric, pelvic or inguinal lymphadenopathy on this noncontrast examination.  Post hysterectomy.  Note is  made of an approximately 2.9 x 1.8 cm right-sided adnexal cyst (image 65, series 2).  Limited visualization of the lower thorax demonstrates minimal subpleural ground-glass opacities favored to represent atelectasis. No focal airspace opacity or pleural effusion.  Normal heart size.  Extensive coronary artery calcifications.  No pericardial effusion.  Likely interval progression of now severe (greater than 75%) compression deformity of the T12 vertebral body.  There is approximately 6 mm of retro listhesis of the posterior inferior endplate of the T12 vertebral body towards the spinal canal.  There is minimal (approximately 4 mm) of anterolisthesis of L4 upon L5. Mild to moderate multilevel lumbar spine DDD, worse at L1 - L2-L4 - L5 with disc space height loss, end plate irregularity and posterior disc osteophyte complexes at these levels.  IMPRESSION:  1.  Nonspecific rather diffuse mesenteric stranding which appears somewhat centered about the pancreas - correlation with amylase and lipase levels is recommended. 2.  Interval progression of now severe (> 75%) compression deformity of the T12 vertebral body with approximately 6 mm of retrolisthesis of the posterior inferior endplate of T12 towards the spinal canal.  As an acute on chronic injury is not excluded, correlation for point tenderness at this location is recommended.  3.  Approximately 2.9 x 1.8 cm right-sided cystic adnexal lesion, an abnormal finding in this postmenopausal patient. 4.  Colonic diverticulosis without evidence of diverticulitis.  5.  Extensive coronary artery calcifications.   Original Report Authenticated By: Tacey Ruiz, MD    Ct Head Wo Contrast  03/13/2012  *RADIOLOGY REPORT*  Clinical Data:  Fall  CT HEAD WITHOUT CONTRAST CT CERVICAL SPINE WITHOUT CONTRAST  Technique:  Multidetector CT imaging of the head and cervical spine was performed following the standard protocol without intravenous contrast.  Multiplanar CT image  reconstructions of the cervical spine were also generated.  Comparison:  08/17/2011  CT HEAD  Findings: No skull fracture is noted.  There is mucosal thickening bilateral maxillary sinus.  The mastoid air cells are unremarkable.  No intracranial hemorrhage, mass effect or midline shift.  Stable cerebral atrophy.  Stable periventricular and patchy subcortical white matter decreased attenuation consistent with chronic small vessel ischemic changes.  No acute infarction.  No mass lesion is noted on this unenhanced scan.  IMPRESSION: No acute intracranial abnormality.  Stable atrophy and chronic white matter disease.  Bilateral maxillary sinus mucosal thickening.  CT CERVICAL SPINE  Findings: Axial images of the cervical spine shows no acute fracture or subluxation.  Computer processed images shows posterior disc bulge and calcification of posterior longitudinal ligament at C2-C3 C3-C4 C4-C5 C5-C6 and C 67 level.  There is disc space flattening with vacuum disc phenomenon at C5-C6 and C6-7 level. Mild anterior spurring noted at C4-C5 C5-C6 and C 67 level.  No prevertebral soft tissue swelling.  Degenerative changes are noted C1-C2 articulation.  IMPRESSION: No acute fracture or subluxation.  Multilevel degenerative changes as described above.   Original Report Authenticated By: Natasha Mead, M.D.    Ct Cervical Spine Wo Contrast  03/13/2012  *RADIOLOGY REPORT*  Clinical Data:  Fall  CT HEAD WITHOUT CONTRAST CT CERVICAL SPINE WITHOUT CONTRAST  Technique:  Multidetector CT imaging of the head and cervical spine was performed following the standard  protocol without intravenous contrast.  Multiplanar CT image reconstructions of the cervical spine were also generated.  Comparison:  08/17/2011  CT HEAD  Findings: No skull fracture is noted.  There is mucosal thickening bilateral maxillary sinus.  The mastoid air cells are unremarkable.  No intracranial hemorrhage, mass effect or midline shift.  Stable cerebral atrophy.   Stable periventricular and patchy subcortical white matter decreased attenuation consistent with chronic small vessel ischemic changes.  No acute infarction.  No mass lesion is noted on this unenhanced scan.  IMPRESSION: No acute intracranial abnormality.  Stable atrophy and chronic white matter disease.  Bilateral maxillary sinus mucosal thickening.  CT CERVICAL SPINE  Findings: Axial images of the cervical spine shows no acute fracture or subluxation.  Computer processed images shows posterior disc bulge and calcification of posterior longitudinal ligament at C2-C3 C3-C4 C4-C5 C5-C6 and C 67 level.  There is disc space flattening with vacuum disc phenomenon at C5-C6 and C6-7 level. Mild anterior spurring noted at C4-C5 C5-C6 and C 67 level.  No prevertebral soft tissue swelling.  Degenerative changes are noted C1-C2 articulation.  IMPRESSION: No acute fracture or subluxation.  Multilevel degenerative changes as described above.   Original Report Authenticated By: Natasha Mead, M.D.    Dg Chest Port 1 View  03/13/2012  *RADIOLOGY REPORT*  Clinical Data: Altered mental status  PORTABLE CHEST - 1 VIEW  Comparison: 04/23/2004  Findings: Cardiomediastinal silhouette is stable.  No acute infiltrate or pleural effusion.  No pulmonary edema.  Central mild bronchitic changes again noted.  IMPRESSION: No active disease.  No significant change.   Original Report Authenticated By: Natasha Mead, M.D.     Medications:  I have reviewed the patient's current medications. Scheduled:   . aspirin  81 mg Oral Daily  . aztreonam  500 mg Intravenous Q8H  . calcium-vitamin D  1 tablet Oral Daily  . docusate sodium  100 mg Oral BID  . enoxaparin (LOVENOX) injection  30 mg Subcutaneous Q24H  . levofloxacin (LEVAQUIN) IV  500 mg Intravenous Q48H  . pantoprazole  40 mg Oral Daily  . sodium chloride  3 mL Intravenous Q12H  . vancomycin  750 mg Intravenous Q48H   Continuous:   . sodium chloride 1,000 mL (03/14/12 0827)  .  sodium chloride     ZOX:[id number], acetaminophen, ondansetron (ZOFRAN) IV, ondansetron, polyethylene glycol, zolpidem  Assessment/Plan: 1) Probable sepsis- She has had a sharp increase in her WBC with left shift. U/A and CXR unremarkable for cause. CT of her abdomen shows some inflammation near her pancreas. Amylase and Lipase are elevated, indicating pancreatitis.. CA 19-9 is in the 80's and with acute illness, not indicative of cancer most likely.  No discrete mass noted. Might also reflect some of her weight loss , but she had noted no abdominal pain and just constipation. Vanc/Aztreonam and Levaquin given in ER, will continue and follow cultures. Normal lactate  2) Pancreatitis- Clears only if she will accept them.  U/S ordered and if stone is noted, will have GI see for possible ERCP.   Will likely need cool down period unless worsens. 3) Hyperlipidemia- Will hold her fibrate (not on a different drug per ER notes, just generic) as associated with gallstones  4) HTN Will D/C all BP meds at this point. She is dehydrated and has increased renal indices, so the Hyzaar needs to be held.  5) Acute renal insufficiency- IV fluids, BP meds held as above. I+O's, foley. No Nsaids, Pharmacy adjusting antibiotics.  Has come down with hydration and good UOP per Foley. 6) Constipation: Bowel regimen.  7) GERD- PPI  8) Thoracic back fracture. Given her hypercalcemia, may need Myeloma workup, although initial imaging did not suggest tumor in that area. Will reassess post hydration and order SPEP and UPEP as well.  9) Hypercalcemia- Normalized with hydration.  Paraprotein workup pending as well. 10)  Confusion-  Considerably different from seeing her 48 hours ago.  If no improvement with metabolic/fever improvement, will pursue MRI brain.   LOS: 1 day   Zyrus Hetland W 03/14/2012, 8:54 AM

## 2012-03-15 DIAGNOSIS — I10 Essential (primary) hypertension: Secondary | ICD-10-CM | POA: Diagnosis not present

## 2012-03-15 DIAGNOSIS — S22000A Wedge compression fracture of unspecified thoracic vertebra, initial encounter for closed fracture: Secondary | ICD-10-CM | POA: Insufficient documentation

## 2012-03-15 DIAGNOSIS — S22009A Unspecified fracture of unspecified thoracic vertebra, initial encounter for closed fracture: Secondary | ICD-10-CM | POA: Diagnosis not present

## 2012-03-15 DIAGNOSIS — M810 Age-related osteoporosis without current pathological fracture: Secondary | ICD-10-CM | POA: Insufficient documentation

## 2012-03-15 DIAGNOSIS — K859 Acute pancreatitis without necrosis or infection, unspecified: Secondary | ICD-10-CM | POA: Diagnosis not present

## 2012-03-15 DIAGNOSIS — R4182 Altered mental status, unspecified: Secondary | ICD-10-CM | POA: Diagnosis present

## 2012-03-15 LAB — LIPASE, BLOOD: Lipase: 145 U/L — ABNORMAL HIGH (ref 11–59)

## 2012-03-15 LAB — COMPREHENSIVE METABOLIC PANEL
Alkaline Phosphatase: 26 U/L — ABNORMAL LOW (ref 39–117)
BUN: 46 mg/dL — ABNORMAL HIGH (ref 6–23)
CO2: 18 mEq/L — ABNORMAL LOW (ref 19–32)
Chloride: 110 mEq/L (ref 96–112)
Creatinine, Ser: 1.47 mg/dL — ABNORMAL HIGH (ref 0.50–1.10)
GFR calc Af Amer: 36 mL/min — ABNORMAL LOW (ref >90)
GFR calc non Af Amer: 31 mL/min — ABNORMAL LOW (ref >90)
Glucose, Bld: 78 mg/dL (ref 70–99)
Potassium: 4 mEq/L (ref 3.5–5.1)
Total Bilirubin: 0.2 mg/dL — ABNORMAL LOW (ref 0.3–1.2)

## 2012-03-15 LAB — CBC
HCT: 30.8 % — ABNORMAL LOW (ref 36.0–46.0)
Hemoglobin: 10.4 g/dL — ABNORMAL LOW (ref 12.0–15.0)
MCV: 83.5 fL (ref 78.0–100.0)
RDW: 15.5 % (ref 11.5–15.5)
WBC: 20.3 10*3/uL — ABNORMAL HIGH (ref 4.0–10.5)

## 2012-03-15 MED ORDER — GUAIFENESIN ER 600 MG PO TB12
600.0000 mg | ORAL_TABLET | Freq: Two times a day (BID) | ORAL | Status: DC
  Administered 2012-03-15 – 2012-03-18 (×7): 600 mg via ORAL
  Filled 2012-03-15 (×9): qty 1

## 2012-03-15 MED ORDER — WHITE PETROLATUM GEL
Status: AC
  Filled 2012-03-15: qty 5

## 2012-03-15 NOTE — Progress Notes (Signed)
Subjective: She is doing much better this AM.  Knew who I was immediately, but a bit unclear on the past couple of days.  Complained mostly of poor sleep last night.  No other issues.  Objective: Vital signs in last 24 hours: Temp:  [97.4 F (36.3 C)-98.1 F (36.7 C)] 98.1 F (36.7 C) (02/02 0451) Pulse Rate:  [81-90] 90  (02/02 0451) Resp:  [18] 18  (02/02 0451) BP: (110-124)/(60-63) 113/62 mmHg (02/02 0451) SpO2:  [94 %] 94 % (02/02 0451) Weight change:  Last BM Date: 03/14/12  Intake/Output from previous day: 02/01 0701 - 02/02 0700 In: 3092.1 [P.O.:240; I.V.:2752.1; IV Piggyback:100] Out: 825 [Urine:825] Intake/Output this shift:   General appearance: appears stated age, delirious and distracted  Neck: no adenopathy, no carotid bruit, no JVD, supple, symmetrical, trachea midline and thyroid not enlarged, symmetric, no tenderness/mass/nodules  Resp: clear to auscultation bilaterally  Cardio: regular rate and rhythm, S1, S2 normal, no murmur, click, rub or gallop  GI: soft, mild epigastric tenderness; bowel sounds normal; no masses, no organomegaly  Extremities: extremities normal, atraumatic, no cyanosis or edema  Skin: Skin color, texture, turgor normal. No rashes or lesions  Neurologic: Alert and oriented x 3, no focal deficits, unclear on the past couple of days..   Lab Results:  Basename 03/15/12 0644 03/14/12 0620  WBC 20.3* 19.9*  HGB 10.4* 10.6*  HCT 30.8* 30.9*  PLT 214 234   BMET  Basename 03/15/12 0644 03/14/12 0620  NA 136 136  K 4.0 4.8  CL 110 108  CO2 18* 21  GLUCOSE 78 68*  BUN 46* 57*  CREATININE 1.47* 2.33*  CALCIUM 7.7* 9.1    Studies/Results: Ct Abdomen Pelvis Wo Contrast  03/13/2012  *RADIOLOGY REPORT*  Clinical Data: Post fall  CT ABDOMEN AND PELVIS WITHOUT CONTRAST  Technique:  Multidetector CT imaging of the abdomen and pelvis was performed following the standard protocol without intravenous contrast.  Comparison: Lumbar spine  radiographs - 08/27/2011  Findings:  The lack of intravenous contrast limits the ability to evaluate solid abdominal organs.  Normal hepatic contour.  Post cholecystectomy.  No ascites.  There is a sub centimeter (approximately 8 mm) exophytic lesion arising from the inferior pole right kidney (image 41, series 2) which is likely too small to accurately characterize.  No renal stones.  There is minimal calcification about the left renal hilum which is likely vascular in etiology.  No urinary obstruction or perinephric stranding.  Normal noncontrast appearance of the bilateral adrenal glands and spleen.   Note is made of rather diffuse mesenteric stranding however this appears to be centered regional to the pancreas. No drainable fluid collection.  Moderate to large stool burden in the rectal vault.  Colonic diverticulosis without noncontrast evidence of diverticulitis. Radiopaque material is seen within a likely duodenal diverticulum. The bowel otherwise normal in course and caliber without wall thickening or evidence of obstruction.  No pneumoperitoneum, pneumatosis or portal venous gas.  Scattered atherosclerotic plaque within a mildly cat hepatic infrarenal abdominal aorta measuring approximately 2.3 x 2.2 cm in diameter.  No definite retroperitoneal, mesenteric, pelvic or inguinal lymphadenopathy on this noncontrast examination.  Post hysterectomy.  Note is made of an approximately 2.9 x 1.8 cm right-sided adnexal cyst (image 65, series 2).  Limited visualization of the lower thorax demonstrates minimal subpleural ground-glass opacities favored to represent atelectasis. No focal airspace opacity or pleural effusion.  Normal heart size.  Extensive coronary artery calcifications.  No pericardial effusion.  Likely interval  progression of now severe (greater than 75%) compression deformity of the T12 vertebral body.  There is approximately 6 mm of retro listhesis of the posterior inferior endplate of the T12  vertebral body towards the spinal canal.  There is minimal (approximately 4 mm) of anterolisthesis of L4 upon L5. Mild to moderate multilevel lumbar spine DDD, worse at L1 - L2-L4 - L5 with disc space height loss, end plate irregularity and posterior disc osteophyte complexes at these levels.  IMPRESSION:  1.  Nonspecific rather diffuse mesenteric stranding which appears somewhat centered about the pancreas - correlation with amylase and lipase levels is recommended. 2.  Interval progression of now severe (> 75%) compression deformity of the T12 vertebral body with approximately 6 mm of retrolisthesis of the posterior inferior endplate of T12 towards the spinal canal.  As an acute on chronic injury is not excluded, correlation for point tenderness at this location is recommended.  3.  Approximately 2.9 x 1.8 cm right-sided cystic adnexal lesion, an abnormal finding in this postmenopausal patient. 4.  Colonic diverticulosis without evidence of diverticulitis.  5.  Extensive coronary artery calcifications.   Original Report Authenticated By: Tacey Ruiz, MD    Ct Head Wo Contrast  03/13/2012  *RADIOLOGY REPORT*  Clinical Data:  Fall  CT HEAD WITHOUT CONTRAST CT CERVICAL SPINE WITHOUT CONTRAST  Technique:  Multidetector CT imaging of the head and cervical spine was performed following the standard protocol without intravenous contrast.  Multiplanar CT image reconstructions of the cervical spine were also generated.  Comparison:  08/17/2011  CT HEAD  Findings: No skull fracture is noted.  There is mucosal thickening bilateral maxillary sinus.  The mastoid air cells are unremarkable.  No intracranial hemorrhage, mass effect or midline shift.  Stable cerebral atrophy.  Stable periventricular and patchy subcortical white matter decreased attenuation consistent with chronic small vessel ischemic changes.  No acute infarction.  No mass lesion is noted on this unenhanced scan.  IMPRESSION: No acute intracranial  abnormality.  Stable atrophy and chronic white matter disease.  Bilateral maxillary sinus mucosal thickening.  CT CERVICAL SPINE  Findings: Axial images of the cervical spine shows no acute fracture or subluxation.  Computer processed images shows posterior disc bulge and calcification of posterior longitudinal ligament at C2-C3 C3-C4 C4-C5 C5-C6 and C 67 level.  There is disc space flattening with vacuum disc phenomenon at C5-C6 and C6-7 level. Mild anterior spurring noted at C4-C5 C5-C6 and C 67 level.  No prevertebral soft tissue swelling.  Degenerative changes are noted C1-C2 articulation.  IMPRESSION: No acute fracture or subluxation.  Multilevel degenerative changes as described above.   Original Report Authenticated By: Natasha Mead, M.D.    Ct Cervical Spine Wo Contrast  03/13/2012  *RADIOLOGY REPORT*  Clinical Data:  Fall  CT HEAD WITHOUT CONTRAST CT CERVICAL SPINE WITHOUT CONTRAST  Technique:  Multidetector CT imaging of the head and cervical spine was performed following the standard protocol without intravenous contrast.  Multiplanar CT image reconstructions of the cervical spine were also generated.  Comparison:  08/17/2011  CT HEAD  Findings: No skull fracture is noted.  There is mucosal thickening bilateral maxillary sinus.  The mastoid air cells are unremarkable.  No intracranial hemorrhage, mass effect or midline shift.  Stable cerebral atrophy.  Stable periventricular and patchy subcortical white matter decreased attenuation consistent with chronic small vessel ischemic changes.  No acute infarction.  No mass lesion is noted on this unenhanced scan.  IMPRESSION: No acute intracranial abnormality.  Stable atrophy and chronic white matter disease.  Bilateral maxillary sinus mucosal thickening.  CT CERVICAL SPINE  Findings: Axial images of the cervical spine shows no acute fracture or subluxation.  Computer processed images shows posterior disc bulge and calcification of posterior longitudinal  ligament at C2-C3 C3-C4 C4-C5 C5-C6 and C 67 level.  There is disc space flattening with vacuum disc phenomenon at C5-C6 and C6-7 level. Mild anterior spurring noted at C4-C5 C5-C6 and C 67 level.  No prevertebral soft tissue swelling.  Degenerative changes are noted C1-C2 articulation.  IMPRESSION: No acute fracture or subluxation.  Multilevel degenerative changes as described above.   Original Report Authenticated By: Natasha Mead, M.D.    US Abdomen Complete  03/14/2012  *RADIOLOGY REPORT*  Clinical Data:  Pancreatitis, history of cholecystectomy and appendectomy  COMPLETE ABDOMINAL ULTRASOUND  Comparison:  CT abdomen pelvis - 03/13/2012  Findings:  Gallbladder:  Surgically absent  Common bile duct:  Normal in size for age measuring 7.5 mm in diameter  Liver:  Homogeneous hepatic echotexture.  No discrete hepatic lesions.  No definite evidence of intrahepatic biliary ductal dilatation.  No ascites.  IVC:  Appears normal.  Pancreas:  Appears diffusely thickened compatible with findings on recent abdominal CT.  No definite pancreatic mass or peripancreatic fluid collection.  No definite pancreatic duct dilatation.  Spleen:  Normal in size for age measuring 6.8 cm in length.  Right Kidney:  Normal cortical thickness, echogenicity and size, measuring 11.9 cm in length.  Incidental note is made of an approximately 1.0 x 0.8 x 0.9 cm partially exophytic anechoic cyst arising from the inferior aspect of the right kidney.  No echogenic renal stones.  No urinary obstruction.  Left Kidney:  Normal cortical thickness, echogenicity and size, measuring 11.9 cm in length.  No focal renal lesions.  No echogenic renal stones.  No urinary obstruction.  Abdominal aorta:  No aneurysm identified.  IMPRESSION: 1.  There is mild diffuse thickening of the pancreatic parenchyma suggestive of pancreatitis as demonstrated on recent abdominal CT. No definite pancreatic ductal dilatation.  No definite intra or extrahepatic biliary duct  dilatation.  2.  Right-sided renal cyst.   Original Report Authenticated By: Tacey Ruiz, MD    Dg Chest Port 1 View  03/13/2012  *RADIOLOGY REPORT*  Clinical Data: Altered mental status  PORTABLE CHEST - 1 VIEW  Comparison: 04/23/2004  Findings: Cardiomediastinal silhouette is stable.  No acute infiltrate or pleural effusion.  No pulmonary edema.  Central mild bronchitic changes again noted.  IMPRESSION: No active disease.  No significant change.   Original Report Authenticated By: Natasha Mead, M.D.     Medications:  I have reviewed the patient's current medications. Scheduled:   . aspirin  81 mg Oral Daily  . aztreonam  500 mg Intravenous Q8H  . calcium-vitamin D  1 tablet Oral Daily  . docusate sodium  100 mg Oral BID  . enoxaparin (LOVENOX) injection  30 mg Subcutaneous Q24H  . guaiFENesin  600 mg Oral BID  . levofloxacin (LEVAQUIN) IV  500 mg Intravenous Q48H  . pantoprazole  40 mg Oral Daily  . sodium chloride  3 mL Intravenous Q12H  . vancomycin  750 mg Intravenous Q48H   Continuous:   . sodium chloride 100 mL/hr at 03/15/12 0127   ZOX:[id number], acetaminophen, ondansetron (ZOFRAN) IV, ondansetron, polyethylene glycol, zolpidem  Assessment/Plan: 1) Probable sepsis- Will allow 1 more day of blood cultures and if negative, will use only abx to cover abdominal  issues. 2) Pancreatitis- Clears only for today. U/S shows no ductal blockage and she does not have a gallbladder.  Supportive care.  Discussed slow refeeding schedule with her at length today. 3) Hyperlipidemia- Will hold her fibrate (not on a different drug per ER notes, just generic) as associated with gallstones  Check in AM 4) HTN Will D/C all BP meds at this point. She is dehydrated and has increased renal indices, so the Hyzaar needs to be held.  5) Acute renal insufficiency- IV fluids,Cr 1.7, resolving with hydration. 6) Constipation: Bowel regimen.  7) GERD- PPI  8) Thoracic back fracture. Given her  hypercalcemia, may need Myeloma workup, although initial imaging did not suggest tumor in that area. Will reassess post hydration and order SPEP and UPEP as well.  9) Hypercalcemia- Normalized with hydration. 10) Confusion- Presumed metabolic in origin and has improved significantly in the past day.   LOS: 2 days   Mallory Jensen 03/15/2012, 8:37 AM

## 2012-03-15 NOTE — Clinical Social Work Placement (Addendum)
Clinical Social Work Department CLINICAL SOCIAL WORK PLACEMENT NOTE 03/15/2012  Patient:  Mallory Jensen, Mallory Jensen  Account Number:  [id number] Admit date:  03/13/2012  Clinical Social Worker:  Oswaldo Done  Date/time:  03/15/2012 04:41 PM  Clinical Social Work is seeking post-discharge placement for this patient at the following level of care:   SKILLED NURSING   (*CSW will update this form in Epic as items are completed)   03/15/2012  Patient/family provided with [facility] Department of Clinical Social Work's list of facilities offering this level of care within the geographic area requested by the patient (or if unable, by the patient's family).  03/15/2012  Patient/family informed of their freedom to choose among providers that offer the needed level of care, that participate in Medicare, Medicaid or managed care program needed by the patient, have an available bed and are willing to accept the patient.  03/15/2012  Patient/family informed of [facility]' ownership interest in [facility], as well as of the fact that they are under no obligation to receive care at this facility.  PASARR submitted to EDS on 03/16/2012 PASARR number received from EDS on 03/16/2012  FL2 transmitted to all facilities in geographic area requested by pt/family on  03/15/2012  Patient/family informed of bed offers received:  03/16/2012 Patient chooses bed at [facility] Physician recommends and patient chooses bed at  N/A  Patient to be transferred to  on  [facility] on 03/18/2012 Patient to be transferred to facility by [facility]  The following physician request were entered in Epic:   Additional Comments:  Ricke Hey, Theresia Majors [phone] (weekend)

## 2012-03-15 NOTE — Clinical Social Work Psychosocial (Signed)
Clinical Social Work Department BRIEF PSYCHOSOCIAL ASSESSMENT 03/15/2012  Patient:  Mallory Jensen, Mallory Jensen     Account Number:  [id number]     Admit date:  03/13/2012  Clinical Social Worker:  Oswaldo Done  Date/Time:  03/15/2012 03:57 PM  Referred by:  Physician  Date Referred:  03/15/2012 Referred for  Other - See comment   Other Referral:   "Dr. referral states "home needs".   Interview type:   Other interview type:    PSYCHOSOCIAL DATA Living Status:  ALONE Admitted from facility:   Level of care:   Primary support name:  Mallory Jensen Primary support relationship to patient:  CHILD, ADULT Degree of support available:   Adequate    CURRENT CONCERNS Current Concerns  Post-Acute Placement   Other Concerns:    SOCIAL WORK ASSESSMENT / PLAN CSW consulted for "home needs." Talked with patient and daughter, Mallory Jensen, at patient bedside. Patient fell last May and again in July. In July, patient decided to get 24 hour assistance in the home, using  company "[employer]." Patient began to do well again and discontinued 24 hour assistance. Has Life Alert at home but was not wearing at the time of this fall. Patient has five adult children, 1 son lives in [place], 1 son lives in [place] but travels often for his job and is out of town for several weeks at a time, daughter Mallory Jensen lives in [place] and 2 other daughters live in [place]. All of the daughters work full time and are not able to stay at home with patient to provide 24 hour care. Patient and daughter agreed patient may need SNF placement at discharge. Patient and daughter requested CSW send out patient information to [facility] facilities to see what beds are avaiable.   Assessment/plan status:  Information/Referral to [facility] Other assessment/ plan:   Information/referral to community resources:   SNF list  Medicare coverage    PATIENT'S/FAMILY'S RESPONSE TO PLAN OF CARE: Patient and  daughter thanked CSW for SNF list and for sending information out to facilities for possible placement.        Ricke Hey, [place] [phone] (weekend)

## 2012-03-16 DIAGNOSIS — S22009A Unspecified fracture of unspecified thoracic vertebra, initial encounter for closed fracture: Secondary | ICD-10-CM | POA: Diagnosis not present

## 2012-03-16 DIAGNOSIS — R4182 Altered mental status, unspecified: Secondary | ICD-10-CM | POA: Diagnosis not present

## 2012-03-16 DIAGNOSIS — I10 Essential (primary) hypertension: Secondary | ICD-10-CM | POA: Diagnosis not present

## 2012-03-16 DIAGNOSIS — K859 Acute pancreatitis without necrosis or infection, unspecified: Secondary | ICD-10-CM | POA: Diagnosis not present

## 2012-03-16 LAB — LIPID PANEL
Cholesterol: 76 mg/dL (ref 0–200)
LDL Cholesterol: 46 mg/dL (ref 0–99)
Total CHOL/HDL Ratio: 12.7 RATIO
VLDL: 24 mg/dL (ref 0–40)

## 2012-03-16 LAB — COMPREHENSIVE METABOLIC PANEL
AST: 19 U/L (ref 0–37)
Albumin: 2.1 g/dL — ABNORMAL LOW (ref 3.5–5.2)
Alkaline Phosphatase: 29 U/L — ABNORMAL LOW (ref 39–117)
BUN: 34 mg/dL — ABNORMAL HIGH (ref 6–23)
Chloride: 106 mEq/L (ref 96–112)
Potassium: 3.7 mEq/L (ref 3.5–5.1)
Sodium: 133 mEq/L — ABNORMAL LOW (ref 135–145)
Total Protein: 5.3 g/dL — ABNORMAL LOW (ref 6.0–8.3)

## 2012-03-16 LAB — CBC
HCT: 29.2 % — ABNORMAL LOW (ref 36.0–46.0)
MCHC: 34.2 g/dL (ref 30.0–36.0)
Platelets: 210 10*3/uL (ref 150–400)
RDW: 15.6 % — ABNORMAL HIGH (ref 11.5–15.5)
WBC: 16.4 10*3/uL — ABNORMAL HIGH (ref 4.0–10.5)

## 2012-03-16 LAB — PTH, INTACT AND CALCIUM
Calcium, Total (PTH): 10 mg/dL (ref 8.4–10.5)
PTH: <2.5 pg/mL — ABNORMAL LOW (ref 14.0–72.0)

## 2012-03-16 MED ORDER — CIPROFLOXACIN HCL 500 MG PO TABS
500.0000 mg | ORAL_TABLET | Freq: Every day | ORAL | Status: DC
  Administered 2012-03-16 – 2012-03-18 (×3): 500 mg via ORAL
  Filled 2012-03-16 (×3): qty 1

## 2012-03-16 MED ORDER — CALCIUM CARBONATE-VITAMIN D 500-200 MG-UNIT PO TABS
1.0000 | ORAL_TABLET | Freq: Every day | ORAL | Status: DC
  Administered 2012-03-16 – 2012-03-18 (×3): 1 via ORAL
  Filled 2012-03-16 (×3): qty 1

## 2012-03-16 NOTE — Evaluation (Signed)
Physical Therapy Evaluation Patient Details Name: Mallory Jensen MRN: [id number] DOB: [birth year] Today's Date: 03/16/2012 Time: 1110-1150 PT Time Calculation (min): 40 min  PT Assessment / Plan / Recommendation Clinical Impression  79 y.o. female with h/o T12 compression fx admitted with fall, confusion, weakness, and decrease appetite. Dx: pancreatitis. Pt lives alone and requires mod assist for mobility, ST-SNF recommended. Pt/daughter agreeable to this. Pt would benefit from acute PT to maximize safety and independence with mobility.    PT Assessment  Patient needs continued PT services    Follow Up Recommendations  SNF;Supervision for mobility/OOB    Does the patient have the potential to tolerate intense rehabilitation      Barriers to Discharge None      Equipment Recommendations  None recommended by PT    Recommendations for Other Services     Frequency Min 3X/week    Precautions / Restrictions Precautions Precautions: Fall Precaution Comments: pt has had several falls in past year Restrictions Weight Bearing Restrictions: No   Pertinent Vitals/Pain *no c/o pain**      Mobility  Bed Mobility Bed Mobility: Rolling Left;Left Sidelying to Sit Rolling Left: 3: Mod assist Left Sidelying to Sit: 3: Mod assist Details for Bed Mobility Assistance: assist to intiate roll and to elevate trunk Transfers Transfers: Sit to Stand;Stand to Sit Sit to Stand: 1: +2 Total assist;From bed;From chair/3-in-1 Sit to Stand: Patient Percentage: 60% Stand to Sit: 1: +2 Total assist;To chair/3-in-1 Stand to Sit: Patient Percentage: 80% Details for Transfer Assistance: +2 for safety 2* weakness and h/o falls, VCs for hand placement Ambulation/Gait Ambulation/Gait Assistance: 3: Mod assist Ambulation Distance (Feet): 6 Feet (4' +2' bed to 3-in-1 then to recliner) Assistive device: Rolling walker Gait Pattern: Step-to pattern;Decreased step length - right;Decreased step length -  left;Trunk flexed;Narrow base of support General Gait Details: VCs to keep walker close and to widen BOS    Exercises     PT Diagnosis: Difficulty walking;Generalized weakness  PT Problem List: Decreased activity tolerance;Decreased knowledge of use of DME;Decreased mobility PT Treatment Interventions: Gait training;Functional mobility training;Therapeutic activities;Therapeutic exercise;Patient/family education;Balance training   PT Goals Acute Rehab PT Goals PT Goal Formulation: With patient/family Time For Goal Achievement: 03/30/12 Pt will go Supine/Side to Sit: with min assist PT Goal: Supine/Side to Sit - Progress: Goal set today Pt will go Sit to Stand: with min assist PT Goal: Sit to Stand - Progress: Goal set today Pt will Transfer Bed to Chair/Chair to Bed: with min assist PT Transfer Goal: Bed to Chair/Chair to Bed - Progress: Goal set today Pt will Ambulate: 16 - 50 feet;with min assist;with rolling walker PT Goal: Ambulate - Progress: Goal set today  Visit Information  Last PT Received On: 03/16/12 Assistance Needed: +2    Subjective Data  Subjective: "I need to use the bathroom." Patient Stated Goal: ST-SNF then home   Prior Functioning  Home Living Lives With: Alone Type of Home: House Home Access: Stairs to enter Secretary/administrator of Steps: 3 Entrance Stairs-Rails: Right;Left Home Layout: One level Bathroom Shower/Tub: Engineer, manufacturing systems: Standard Home Adaptive Equipment: Bedside commode/3-in-1;Walker - standard;Quad cane Prior Function Level of Independence: Independent with assistive device(s) Able to Take Stairs?: Yes Driving: Yes Communication Communication: HOH    Cognition  Cognition Overall Cognitive Status: Appears within functional limits for tasks assessed/performed Arousal/Alertness: Awake/alert Orientation Level: Appears intact for tasks assessed Behavior During Session: [facility] for tasks performed    Extremity/Trunk  Assessment Right Upper Extremity Assessment RUE  ROM/Strength/Tone: [facility] for tasks assessed Left Upper Extremity Assessment LUE ROM/Strength/Tone: WFL for tasks assessed Right Lower Extremity Assessment RLE ROM/Strength/Tone: Within functional levels RLE Sensation: WFL - Light Touch RLE Coordination: WFL - gross/fine motor Left Lower Extremity Assessment LLE ROM/Strength/Tone: Within functional levels LLE Sensation: WFL - Light Touch LLE Coordination: WFL - gross/fine motor Trunk Assessment Trunk Assessment: Kyphotic   Balance Balance Balance Assessed: Yes Static Sitting Balance Static Sitting - Balance Support: Bilateral upper extremity supported;Feet supported Static Sitting - Level of Assistance: 5: Stand by assistance Static Sitting - Comment/# of Minutes: 2  End of Session PT - End of Session Activity Tolerance: Patient tolerated treatment well;Patient limited by fatigue Patient left: in chair;with call bell/phone within reach;with family/visitor present Nurse Communication: Mobility status  GP     Ralene Bathe Kistler 03/16/2012, 11:59 AM  [phone]

## 2012-03-16 NOTE — Progress Notes (Signed)
Subjective: Doing well this morning.  Some loose stools.  Feels like should eat more.  We reviewed that she does not have cancer, after she told her daughter that she did.  States that the words just came out wrong.  Has been up in her chair, but no ambulation as if yet.  Objective: Vital signs in last 24 hours: Temp:  [97.4 F (36.3 C)-98.6 F (37 C)] 97.4 F (36.3 C) (02/03 0451) Pulse Rate:  [80-84] 83  (02/03 0451) Resp:  [17-18] 17  (02/03 0451) BP: (126-133)/(69-74) 133/70 mmHg (02/03 0451) SpO2:  [95 %-96 %] 96 % (02/03 0451) Weight change:  Last BM Date: 03/15/12  Intake/Output from previous day: 02/02 0701 - 02/03 0700 In: 3128.8 [P.O.:940; I.V.:1788.8; IV Piggyback:400] Out: -  Intake/Output this shift:   General appearance: appears stated age, delirious and distracted  Neck: no adenopathy, no carotid bruit, no JVD, supple, symmetrical, trachea midline and thyroid not enlarged, symmetric, no tenderness/mass/nodules  Resp: clear to auscultation bilaterally  Cardio: regular rate and rhythm, S1, S2 normal, no murmur, click, rub or gallop  GI: soft, minimal epigastric tenderness; bowel sounds normal; no masses, no organomegaly  Extremities: extremities normal, atraumatic, no cyanosis or edema  Skin: Skin color, texture, turgor normal. No rashes or lesions  Neurologic: Alert and oriented x 3, no focal deficits,. Lab Results:  [facility] 03/16/12 0615 03/15/12 0644  WBC 16.4* 20.3*  HGB 10.0* 10.4*  HCT 29.2* 30.8*  PLT 210 214   BMET  Basename 03/16/12 0615 03/15/12 0644  NA 133* 136  K 3.7 4.0  CL 106 110  CO2 17* 18*  GLUCOSE 87 78  BUN 34* 46*  CREATININE 1.08 1.47*  CALCIUM 6.9* 7.7*    Studies/Results: US Abdomen Complete  03/14/2012  *RADIOLOGY REPORT*  Clinical Data:  Pancreatitis, history of cholecystectomy and appendectomy  COMPLETE ABDOMINAL ULTRASOUND  Comparison:  CT abdomen pelvis - 03/13/2012  Findings:  Gallbladder:  Surgically absent  Common bile  duct:  Normal in size for age measuring 7.5 mm in diameter  Liver:  Homogeneous hepatic echotexture.  No discrete hepatic lesions.  No definite evidence of intrahepatic biliary ductal dilatation.  No ascites.  IVC:  Appears normal.  Pancreas:  Appears diffusely thickened compatible with findings on recent abdominal CT.  No definite pancreatic mass or peripancreatic fluid collection.  No definite pancreatic duct dilatation.  Spleen:  Normal in size for age measuring 6.8 cm in length.  Right Kidney:  Normal cortical thickness, echogenicity and size, measuring 11.9 cm in length.  Incidental note is made of an approximately 1.0 x 0.8 x 0.9 cm partially exophytic anechoic cyst arising from the inferior aspect of the right kidney.  No echogenic renal stones.  No urinary obstruction.  Left Kidney:  Normal cortical thickness, echogenicity and size, measuring 11.9 cm in length.  No focal renal lesions.  No echogenic renal stones.  No urinary obstruction.  Abdominal aorta:  No aneurysm identified.  IMPRESSION: 1.  There is mild diffuse thickening of the pancreatic parenchyma suggestive of pancreatitis as demonstrated on recent abdominal CT. No definite pancreatic ductal dilatation.  No definite intra or extrahepatic biliary duct dilatation.  2.  Right-sided renal cyst.   Original Report Authenticated By: Tacey Ruiz, MD     Medications:  I have reviewed the patient's current medications. Scheduled:   . aspirin  81 mg Oral Daily  . calcium-vitamin D  1 tablet Oral Daily  . ciprofloxacin  500 mg Oral BID  .  enoxaparin (LOVENOX) injection  30 mg Subcutaneous Q24H  . guaiFENesin  600 mg Oral BID  . pantoprazole  40 mg Oral Daily   Continuous:  ZOX:[id number], acetaminophen, ondansetron (ZOFRAN) IV, ondansetron, polyethylene glycol, zolpidem  Assessment/Plan: 1) Pancreatitis- Increase to full liquids.  Lipase normal at this time.  Will decrease abx as no positive cultures to Cipro only to cover for biliary  infection, as no inciting cause noted. 2) Hyperlipidemia-Lipids ok, no elevation in TG's.  Will discontinue her Trilipix completely and readdress as outpatient. 3) HTN OK off meds at this point.  Held due to ARI. 4) Acute renal insufficiency- IV fluids,Cr 1.0 now, resolving with hydration. Will d/c IVF and urge PO intake. 5) Constipation: Bowel regimen held as now some diarrhea. 6) GERD- PPI  7) Thoracic back fracture. Given her hypercalcemia, may need Myeloma workup, although initial imaging did not suggest tumor in that area. Will reassess post hydration and order SPEP and UPEP as well.  8) Hypercalcemia- Normalized with hydration. Now slightly low, but adjusts to near normal with low albumin 9) Confusion- Presumed metabolic in origin and resolved. 10) Protein calorie malnutrition.  Patient had weight loss over the past month and could have had some low level pancreatitis prior as she is relatively asymptomatic for pain, etc.  Slow refeeding at this point due to illness, but will encourage more protein intake when medically feasible.  Plan for today is for PT to get her up and see how she does, as well as advancing diet.  Social work has sent FL-2 and I agree that she may benefit from a SNF for short term.  She may reject this idea, so will need to consider home health as alternative.    LOS: 3 days   TISOVEC,RICHARD W 03/16/2012, 8:07 AM

## 2012-03-16 NOTE — Clinical Documentation Improvement (Signed)
RENAL FAILURE DOCUMENTATION CLARIFICATION QUERY  THIS DOCUMENT IS NOT A PERMANENT PART OF THE MEDICAL RECORD  TO RESPOND TO THE THIS QUERY, FOLLOW THE INSTRUCTIONS BELOW:  1. If needed, update documentation for the patient's encounter via the notes activity.  2. Access this query again and click edit on the In [employer].  3. After updating, or not, click F2 to complete all highlighted (required) fields concerning your review. Select "additional documentation in the medical record" OR "no additional documentation provided".  4. Click Sign note button.  5. The deficiency will fall out of your In Basket *Please let us know if you are not able to complete this workflow by phone or e-mail (listed below).  Please update your documentation within the medical record to reflect your response to this query.                                                                                    03/16/12  Dear Dr. Guerry Bruin / Associates   In a better effort to capture your patient's severity of illness, reflect appropriate length of stay and utilization of resources, a review of the patient medical record has revealed the following indicators.    Based on your clinical judgment, please clarify and document in a progress note and/or discharge summary the clinical condition associated with the following supporting information:  In responding to this query please exercise your independent judgment.  The fact that a query is asked, does not imply that any particular answer is desired or expected.  Noting BUN and Creatinine levels elevated upon admission, CMET ordered daily, IVF bolus of 1000 cc x 2 on admit,  s/p abx for URI prior to admit, also possibility of being down greater than 4 hours from fall.  If there is a diagnosis pertaining to lab values please document in chart.  Thank you   Possible Clinical Conditions?   Acute Renal Failure  Acute Kidney Injury  Acute on Chronic Renal  Failure  Other Condition  Cannot Clinically Determine       Risk Factors: rec abx therapy for URI,   Signs and Symptoms: bun and creatinine more than tripled previous lab values     Lab: CMET  Baseline Cr Level :1.08  ( 03/16/12), 1.00 ( 08/17/11)   Current Creatinine Level (trend): 1.08 1.47 (H)CM 2.33 (H)CM 3.70 (H) 3.18 (H)   Baseline BUN Level :  34 ( 03/16/12) ,  16 ( 08/17/11)   Current BUN Level (trend): 34 (H) 46 (H) 57 (H) 61 (H) 65 (H)   Electrolytes: GFR: 45 (L) 90 mL/min" 31 (L) 90 mL/min" 18 (L) 90 mL/min" 12 (L)   Treatments: NS @ 125 ml 2 liters then 75 ml/ dcd 2/3                   You may use possible, probable, or suspect with inpatient documentation. possible, probable, suspected diagnoses MUST be documented at the time of discharge  Reviewed:  no additional documentation provided  Thank You,  Lavonda Jumbo  Clinical Documentation Specialist, BSN: Pager [phone]  Health Information Management [place]

## 2012-03-16 NOTE — Plan of Care (Signed)
Problem: Phase I Progression Outcomes Goal: Initial discharge plan identified Outcome: Completed/Met Date Met:  03/16/12 Needs SNF placement

## 2012-03-17 ENCOUNTER — Inpatient Hospital Stay (HOSPITAL_COMMUNITY): Payer: Medicare Other

## 2012-03-17 DIAGNOSIS — K859 Acute pancreatitis without necrosis or infection, unspecified: Secondary | ICD-10-CM | POA: Diagnosis not present

## 2012-03-17 DIAGNOSIS — S22009A Unspecified fracture of unspecified thoracic vertebra, initial encounter for closed fracture: Secondary | ICD-10-CM | POA: Diagnosis not present

## 2012-03-17 DIAGNOSIS — I10 Essential (primary) hypertension: Secondary | ICD-10-CM | POA: Diagnosis not present

## 2012-03-17 DIAGNOSIS — R4182 Altered mental status, unspecified: Secondary | ICD-10-CM | POA: Diagnosis not present

## 2012-03-17 DIAGNOSIS — J9819 Other pulmonary collapse: Secondary | ICD-10-CM | POA: Diagnosis not present

## 2012-03-17 LAB — PROTEIN ELECTROPHORESIS, SERUM
Albumin ELP: 53.6 % — ABNORMAL LOW (ref 55.8–66.1)
Alpha-1-Globulin: 6.1 % — ABNORMAL HIGH (ref 2.9–4.9)
Beta 2: 6 % (ref 3.2–6.5)
Total Protein ELP: 6.1 g/dL (ref 6.0–8.3)

## 2012-03-17 LAB — COMPREHENSIVE METABOLIC PANEL
AST: 16 U/L (ref 0–37)
Albumin: 1.9 g/dL — ABNORMAL LOW (ref 3.5–5.2)
Calcium: 6.7 mg/dL — ABNORMAL LOW (ref 8.4–10.5)
Creatinine, Ser: 0.92 mg/dL (ref 0.50–1.10)
Sodium: 133 mEq/L — ABNORMAL LOW (ref 135–145)

## 2012-03-17 LAB — CBC
MCH: 28.4 pg (ref 26.0–34.0)
MCV: 80.6 fL (ref 78.0–100.0)
Platelets: 216 10*3/uL (ref 150–400)
RDW: 15.2 % (ref 11.5–15.5)
WBC: 15.4 10*3/uL — ABNORMAL HIGH (ref 4.0–10.5)

## 2012-03-17 MED ORDER — BENZONATATE 100 MG PO CAPS
100.0000 mg | ORAL_CAPSULE | Freq: Three times a day (TID) | ORAL | Status: DC | PRN
  Administered 2012-03-17 – 2012-03-18 (×3): 100 mg via ORAL
  Filled 2012-03-17 (×3): qty 1

## 2012-03-17 MED ORDER — ALBUTEROL SULFATE HFA 108 (90 BASE) MCG/ACT IN AERS
2.0000 | INHALATION_SPRAY | RESPIRATORY_TRACT | Status: DC | PRN
  Administered 2012-03-18: 2 via RESPIRATORY_TRACT
  Filled 2012-03-17: qty 6.7

## 2012-03-17 NOTE — Progress Notes (Signed)
Subjective: Spoke with patient and family last night and by self this AM.  Still with an annoying cough that she had pre-hospitalization.  WE reviewed her ongoing issues in depth.  She is willing to go to [facility] as her daughter has arranged until she is strong enough to go home again.  Hungry for eggs and toast this AM.  Objective: Vital signs in last 24 hours: Temp:  [98.1 F (36.7 C)-98.4 F (36.9 C)] 98.1 F (36.7 C) (02/04 0450) Pulse Rate:  [85-87] 87  (02/04 0450) Resp:  [18-19] 18  (02/04 0450) BP: (119-146)/(46-68) 128/68 mmHg (02/04 0450) SpO2:  [91 %-97 %] 91 % (02/04 0450) Weight change:  Last BM Date: 03/16/12  Intake/Output from previous day: 02/03 0701 - 02/04 0700 In: 360 [P.O.:360] Out: 600 [Urine:600] Intake/Output this shift:   General appearance: appears stated age, delirious and distracted  Neck: no adenopathy, no carotid bruit, no JVD, supple, symmetrical, trachea midline and thyroid not enlarged, symmetric, no tenderness/mass/nodules  Resp: clear to auscultation bilaterally  Cardio: regular rate and rhythm, S1, S2 normal, no murmur, click, rub or gallop  GI: soft, no tenderness; bowel sounds normal; no masses, no organomegaly  Extremities: extremities normal, atraumatic, no cyanosis or edema  Skin: Skin color, texture, turgor normal. No rashes or lesions  Neurologic: Alert and oriented x 3, no focal deficits,.   Lab Results:  Basename 03/17/12 0500 03/16/12 0615  WBC 15.4* 16.4*  HGB 9.1* 10.0*  HCT 25.8* 29.2*  PLT 216 210   BMET  Basename 03/16/12 0615 03/15/12 0644  NA 133* 136  K 3.7 4.0  CL 106 110  CO2 17* 18*  GLUCOSE 87 78  BUN 34* 46*  CREATININE 1.08 1.47*  CALCIUM 6.9* 7.7*    Medications:  I have reviewed the patient's current medications. Scheduled:   . aspirin  81 mg Oral Daily  . calcium-vitamin D  1 tablet Oral Daily  . ciprofloxacin  500 mg Oral Daily  . enoxaparin (LOVENOX) injection  30 mg Subcutaneous Q24H  .  guaiFENesin  600 mg Oral BID  . pantoprazole  40 mg Oral Daily   Continuous:  ZOX:WRUEAVWUJWJXB, acetaminophen, albuterol, ondansetron (ZOFRAN) IV, ondansetron, polyethylene glycol, zolpidem  Assessment/Plan: 1) Pancreatitis- Increase to regular diet today. Lipase normal at this time. Decreased abx as no positive cultures to Cipro only to cover for biliary infection, as no inciting cause noted.  2) Hyperlipidemia-Lipids ok, no elevation in TG's. Will discontinue her Trilipix completely and readdress as outpatient.  3) HTN OK off meds at this point. Held due to ARI.  4) Acute renal insufficiency- IV fluids,Cr 1.0 now, resolved with hydration. Encouraged PO fluid intake.  5) Constipation: Bowel regimen held as now some diarrhea. 1 BM yesterday. 6) GERD- PPI  7) Thoracic back fracture. I had a discussion with her daughter who was eager per the prompting of another family member to have Irisa seen by interventional radiology.  I told them that I feel strongly that right after a sever pancreatitis with AMS was not the time to pursue this, especially for a longstanding fracture.  Her pain issues are expressed as minimal and I worry that reexpanding a minimally symptomatic compression will put undue pressure on the spine above and below and likely cause more issues.  They were ok with that and we can always pursue at an outpatient at a more reasonable time. 8) Hypercalcemia- Normalized with hydration. Now slightly low, but adjusts to near normal with low albumin  9) Confusion- Presumed metabolic in origin and resolved.  10) Protein calorie malnutrition. Patient had weight loss over the past month and could have had some low level pancreatitis prior as she is relatively asymptomatic for pain, etc. Slow refeeding at this point due to illness, but will encourage more protein intake when medically feasible.   Plan for today is more PT and to see how she tolerates Regular Diet.  If she does well, will d/c to  [facility] tomorrow, as her daughter states that they have a bed held there.   LOS: 4 days   Tiwan Schnitker W 03/17/2012, 7:59 AM

## 2012-03-17 NOTE — Care Management Note (Addendum)
    Page 1 of 1   03/18/2012     2:52:16 PM   CARE MANAGEMENT NOTE 03/18/2012  Patient:  Mallory Jensen, Mallory Jensen   Account Number:  [id number]  Date Initiated:  03/17/2012  Documentation initiated by:  Letha Cape  Subjective/Objective Assessment:   dx pancreatitis  admit     Action/Plan:   pt eval-rec snf   Anticipated DC Date:  03/18/2012   Anticipated DC Plan:  SKILLED NURSING FACILITY  In-house referral  Clinical Social Worker      DC Planning Services  CM consult      Choice offered to / List presented to:             Status of service:  Completed, signed off Medicare Important Message given?   (If response is "NO", the following Medicare IM given date fields will be blank) Date Medicare IM given:   Date Additional Medicare IM given:    Discharge Disposition:  SKILLED NURSING FACILITY  Per UR Regulation:  Reviewed for med. necessity/level of care/duration of stay  If discussed at Long Length of Stay Meetings, dates discussed:    Comments:  03/18/12 14:51 Letha Cape RN, BSN [phone] patient dc to snf , CSW following.  03/17/12 17:02 Letha Cape RN, BSN [phone] patient lives alone, per physical therapy recs snf, CSW following for snf, possible dc tomorrow.

## 2012-03-18 DIAGNOSIS — I10 Essential (primary) hypertension: Secondary | ICD-10-CM | POA: Diagnosis not present

## 2012-03-18 DIAGNOSIS — I359 Nonrheumatic aortic valve disorder, unspecified: Secondary | ICD-10-CM | POA: Diagnosis not present

## 2012-03-18 DIAGNOSIS — N83209 Unspecified ovarian cyst, unspecified side: Secondary | ICD-10-CM | POA: Diagnosis not present

## 2012-03-18 DIAGNOSIS — R4182 Altered mental status, unspecified: Secondary | ICD-10-CM | POA: Diagnosis not present

## 2012-03-18 DIAGNOSIS — S22009A Unspecified fracture of unspecified thoracic vertebra, initial encounter for closed fracture: Secondary | ICD-10-CM | POA: Diagnosis not present

## 2012-03-18 DIAGNOSIS — M533 Sacrococcygeal disorders, not elsewhere classified: Secondary | ICD-10-CM | POA: Diagnosis not present

## 2012-03-18 DIAGNOSIS — R5383 Other fatigue: Secondary | ICD-10-CM | POA: Diagnosis not present

## 2012-03-18 DIAGNOSIS — E46 Unspecified protein-calorie malnutrition: Secondary | ICD-10-CM | POA: Diagnosis not present

## 2012-03-18 DIAGNOSIS — M545 Low back pain: Secondary | ICD-10-CM | POA: Diagnosis not present

## 2012-03-18 DIAGNOSIS — R5381 Other malaise: Secondary | ICD-10-CM | POA: Diagnosis not present

## 2012-03-18 DIAGNOSIS — M8448XD Pathological fracture, other site, subsequent encounter for fracture with routine healing: Secondary | ICD-10-CM | POA: Diagnosis not present

## 2012-03-18 DIAGNOSIS — E785 Hyperlipidemia, unspecified: Secondary | ICD-10-CM | POA: Diagnosis not present

## 2012-03-18 DIAGNOSIS — D649 Anemia, unspecified: Secondary | ICD-10-CM | POA: Diagnosis not present

## 2012-03-18 DIAGNOSIS — G40909 Epilepsy, unspecified, not intractable, without status epilepticus: Secondary | ICD-10-CM | POA: Diagnosis not present

## 2012-03-18 DIAGNOSIS — Z5189 Encounter for other specified aftercare: Secondary | ICD-10-CM | POA: Diagnosis not present

## 2012-03-18 DIAGNOSIS — I4510 Unspecified right bundle-branch block: Secondary | ICD-10-CM | POA: Diagnosis not present

## 2012-03-18 DIAGNOSIS — R05 Cough: Secondary | ICD-10-CM | POA: Diagnosis not present

## 2012-03-18 DIAGNOSIS — K859 Acute pancreatitis without necrosis or infection, unspecified: Secondary | ICD-10-CM | POA: Diagnosis not present

## 2012-03-18 DIAGNOSIS — M810 Age-related osteoporosis without current pathological fracture: Secondary | ICD-10-CM | POA: Diagnosis not present

## 2012-03-18 DIAGNOSIS — M1990 Unspecified osteoarthritis, unspecified site: Secondary | ICD-10-CM | POA: Diagnosis not present

## 2012-03-18 LAB — CBC
HCT: 25.7 % — ABNORMAL LOW (ref 36.0–46.0)
Hemoglobin: 9.1 g/dL — ABNORMAL LOW (ref 12.0–15.0)
MCV: 79.8 fL (ref 78.0–100.0)
RBC: 3.22 MIL/uL — ABNORMAL LOW (ref 3.87–5.11)
WBC: 14.7 10*3/uL — ABNORMAL HIGH (ref 4.0–10.5)

## 2012-03-18 LAB — IRON AND TIBC: UIBC: 206 ug/dL (ref 125–400)

## 2012-03-18 LAB — FOLATE: Folate: 8.1 ng/mL

## 2012-03-18 LAB — COMPREHENSIVE METABOLIC PANEL
AST: 15 U/L (ref 0–37)
BUN: 21 mg/dL (ref 6–23)
CO2: 16 mEq/L — ABNORMAL LOW (ref 19–32)
Chloride: 107 mEq/L (ref 96–112)
Creatinine, Ser: 0.76 mg/dL (ref 0.50–1.10)
GFR calc non Af Amer: 74 mL/min — ABNORMAL LOW (ref >90)
Glucose, Bld: 106 mg/dL — ABNORMAL HIGH (ref 70–99)
Total Bilirubin: 0.3 mg/dL (ref 0.3–1.2)

## 2012-03-18 LAB — FERRITIN: Ferritin: 240 ng/mL (ref 10–291)

## 2012-03-18 LAB — LIPASE, BLOOD: Lipase: 87 U/L — ABNORMAL HIGH (ref 11–59)

## 2012-03-18 MED ORDER — ACETAMINOPHEN 325 MG PO TABS: 650.0000 mg | ORAL_TABLET | Freq: Four times a day (QID) | ORAL | Status: DC | PRN

## 2012-03-18 MED ORDER — POLYETHYLENE GLYCOL 3350 17 G PO PACK: 17.0000 g | PACK | Freq: Every day | ORAL | Status: DC | PRN

## 2012-03-18 MED ORDER — CIPROFLOXACIN HCL 500 MG PO TABS: 500.0000 mg | ORAL_TABLET | Freq: Every day | ORAL | Status: DC

## 2012-03-18 MED ORDER — BENZONATATE 100 MG PO CAPS: 100.0000 mg | ORAL_CAPSULE | Freq: Three times a day (TID) | ORAL | Status: DC | PRN

## 2012-03-18 MED ORDER — PANTOPRAZOLE SODIUM 40 MG PO TBEC: 40.0000 mg | DELAYED_RELEASE_TABLET | Freq: Every day | ORAL | Status: AC

## 2012-03-18 MED ORDER — GUAIFENESIN ER 600 MG PO TB12: 600.0000 mg | ORAL_TABLET | Freq: Two times a day (BID) | ORAL | Status: DC

## 2012-03-18 NOTE — Clinical Social Work Note (Addendum)
CSW was consulted to complete discharge of patient. Pt to transfer to [facility] today via [facility]. Facility and family are aware of d/c. D/C packet complete with chart copy, signed FL2, and signed hard Rx.  CSW signing off as no other CSW needs identified at this time.  Lia Foyer, LCSWA [facility] Clinical Social Worker Contact #: [phone]

## 2012-03-18 NOTE — Plan of Care (Signed)
Problem: Phase II Progression Outcomes Goal: Discharge plan established Outcome: Completed/Met Date Met:  03/18/12 To d/c to SNF  Problem: Phase III Progression Outcomes Goal: Activity at appropriate level-compared to baseline (UP IN CHAIR FOR HEMODIALYSIS)  Outcome: Progressing To SNF for rehab  Problem: Discharge Progression Outcomes Goal: Activity appropriate for discharge plan Outcome: Progressing To continue rehab at [facility]

## 2012-03-18 NOTE — Progress Notes (Signed)
Mallory Jensen discharged Skilled nursing facility per MD order.  Report called to receiving Mallory Smiling, LPN at [facility]    Mallory Jensen, Mallory Jensen  Home Medication Instructions [id number]   Printed on:03/18/12 1453  Medication Information                    Multiple Vitamin (MULTIVITAMIN WITH MINERALS) TABS Take 1 tablet by mouth daily.           furosemide (LASIX) 20 MG tablet Take 20 mg by mouth daily as needed. For edema           fish oil-omega-3 fatty acids 1000 MG capsule Take by mouth daily.           aspirin 81 MG tablet Take 81 mg by mouth daily.           calcium-vitamin D (OSCAL WITH D) 500-200 MG-UNIT per tablet Take 1 tablet by mouth daily.           naproxen sodium (ANAPROX) 220 MG tablet Take 220 mg by mouth 2 (two) times daily as needed. pain           acetaminophen (TYLENOL) 325 MG tablet Take 2 tablets (650 mg total) by mouth every 6 (six) hours as needed (or Fever >/= 101).           benzonatate (TESSALON) 100 MG capsule Take 1 capsule (100 mg total) by mouth 3 (three) times daily as needed for cough.           ciprofloxacin (CIPRO) 500 MG tablet Take 1 tablet (500 mg total) by mouth daily.           guaiFENesin (MUCINEX) 600 MG 12 hr tablet Take 1 tablet (600 mg total) by mouth 2 (two) times daily.           pantoprazole (PROTONIX) 40 MG tablet Take 1 tablet (40 mg total) by mouth daily.           polyethylene glycol (MIRALAX / GLYCOLAX) packet Take 17 g by mouth daily as needed.             Patients skin is clean, dry and intact, no evidence of skin break down. IV site discontinued and catheter remains intact. Site without signs and symptoms of complications. Dressing and pressure applied.  Patient transported on a stretcher by non emergent EMS,  no distress noted upon discharge.  Mallory Jensen, Mallory Jensen 03/18/2012 2:53 PM

## 2012-03-18 NOTE — Discharge Summary (Signed)
DISCHARGE SUMMARY  Mallory ORDAZ  MR#: [id number]  DOB:1922/05/19  Date of Admission: 03/13/2012 Date of Discharge: 03/18/2012  Attending Physician:Jaycie Kregel W  Patient's WUJ:WJXBJYN,WGNFAOZ W, MD  Discharge Diagnoses: Principal Problem:  *Pancreatitis Active Problems:  Altered mental status Acture renal insufficiency, resolved  Hyperlipidemia Cough NOS DJD with general weakness HTN [place]uild Aortic stenosis Compression fracture- Thoracic Osteoporosis Benign ovarian cyst RBBB Hyponatremia- mild Protein calorie malnutrition Deconditioning Anemia of chronic disease  Discharge Medications:   Medication List     As of 03/18/2012  8:03 AM    STOP taking these medications         amLODipine 5 MG tablet   Commonly known as: NORVASC      fenofibrate 145 MG tablet   Commonly known as: TRICOR      glucosamine-chondroitin 500-400 MG tablet      losartan-hydrochlorothiazide 100-25 MG per tablet   Commonly known as: HYZAAR      ranitidine 75 MG tablet   Commonly known as: ZANTAC      TAKE these medications         acetaminophen 325 MG tablet   Commonly known as: TYLENOL   Take 2 tablets (650 mg total) by mouth every 6 (six) hours as needed (or Fever >/= 101).      aspirin 81 MG tablet   Take 81 mg by mouth daily.      benzonatate 100 MG capsule   Commonly known as: TESSALON   Take 1 capsule (100 mg total) by mouth 3 (three) times daily as needed for cough.      calcium-vitamin D 500-200 MG-UNIT per tablet   Commonly known as: OSCAL WITH D   Take 1 tablet by mouth daily.      ciprofloxacin 500 MG tablet   Commonly known as: CIPRO   Take 1 tablet (500 mg total) by mouth daily.      fish oil-omega-3 fatty acids 1000 MG capsule   Take by mouth daily.      furosemide 20 MG tablet   Commonly known as: LASIX   Take 20 mg by mouth daily as needed. For edema      guaiFENesin 600 MG 12 hr tablet   Commonly known as: MUCINEX   Take 1 tablet (600 mg  total) by mouth 2 (two) times daily.      multivitamin with minerals Tabs   Take 1 tablet by mouth daily.      naproxen sodium 220 MG tablet   Commonly known as: ANAPROX   Take 220 mg by mouth 2 (two) times daily as needed. pain      pantoprazole 40 MG tablet   Commonly known as: PROTONIX   Take 1 tablet (40 mg total) by mouth daily.      polyethylene glycol packet   Commonly known as: MIRALAX / GLYCOLAX   Take 17 g by mouth daily as needed.        Hospital Procedures: Ct Abdomen Pelvis Wo Contrast  03/13/2012  *RADIOLOGY REPORT*  Clinical Data: Post fall  CT ABDOMEN AND PELVIS WITHOUT CONTRAST  Technique:  Multidetector CT imaging of the abdomen and pelvis was performed following the standard protocol without intravenous contrast.  Comparison: Lumbar spine radiographs - 08/27/2011  Findings:  The lack of intravenous contrast limits the ability to evaluate solid abdominal organs.  Normal hepatic contour.  Post cholecystectomy.  No ascites.  There is a sub centimeter (approximately 8 mm) exophytic lesion arising from the inferior pole right kidney (  image 41, series 2) which is likely too small to accurately characterize.  No renal stones.  There is minimal calcification about the left renal hilum which is likely vascular in etiology.  No urinary obstruction or perinephric stranding.  Normal noncontrast appearance of the bilateral adrenal glands and spleen.   Note is made of rather diffuse mesenteric stranding however this appears to be centered regional to the pancreas. No drainable fluid collection.  Moderate to large stool burden in the rectal vault.  Colonic diverticulosis without noncontrast evidence of diverticulitis. Radiopaque material is seen within a likely duodenal diverticulum. The bowel otherwise normal in course and caliber without wall thickening or evidence of obstruction.  No pneumoperitoneum, pneumatosis or portal venous gas.  Scattered atherosclerotic plaque within a mildly cat  hepatic infrarenal abdominal aorta measuring approximately 2.3 x 2.2 cm in diameter.  No definite retroperitoneal, mesenteric, pelvic or inguinal lymphadenopathy on this noncontrast examination.  Post hysterectomy.  Note is made of an approximately 2.9 x 1.8 cm right-sided adnexal cyst (image 65, series 2).  Limited visualization of the lower thorax demonstrates minimal subpleural ground-glass opacities favored to represent atelectasis. No focal airspace opacity or pleural effusion.  Normal heart size.  Extensive coronary artery calcifications.  No pericardial effusion.  Likely interval progression of now severe (greater than 75%) compression deformity of the T12 vertebral body.  There is approximately 6 mm of retro listhesis of the posterior inferior endplate of the T12 vertebral body towards the spinal canal.  There is minimal (approximately 4 mm) of anterolisthesis of L4 upon L5. Mild to moderate multilevel lumbar spine DDD, worse at L1 - L2-L4 - L5 with disc space height loss, end plate irregularity and posterior disc osteophyte complexes at these levels.  IMPRESSION:  1.  Nonspecific rather diffuse mesenteric stranding which appears somewhat centered about the pancreas - correlation with amylase and lipase levels is recommended. 2.  Interval progression of now severe (> 75%) compression deformity of the T12 vertebral body with approximately 6 mm of retrolisthesis of the posterior inferior endplate of T12 towards the spinal canal.  As an acute on chronic injury is not excluded, correlation for point tenderness at this location is recommended.  3.  Approximately 2.9 x 1.8 cm right-sided cystic adnexal lesion, an abnormal finding in this postmenopausal patient. 4.  Colonic diverticulosis without evidence of diverticulitis.  5.  Extensive coronary artery calcifications.   Original Report Authenticated By: Tacey Ruiz, MD    Ct Head Wo Contrast  03/13/2012  *RADIOLOGY REPORT*  Clinical Data:  Fall  CT HEAD  WITHOUT CONTRAST CT CERVICAL SPINE WITHOUT CONTRAST  Technique:  Multidetector CT imaging of the head and cervical spine was performed following the standard protocol without intravenous contrast.  Multiplanar CT image reconstructions of the cervical spine were also generated.  Comparison:  08/17/2011  CT HEAD  Findings: No skull fracture is noted.  There is mucosal thickening bilateral maxillary sinus.  The mastoid air cells are unremarkable.  No intracranial hemorrhage, mass effect or midline shift.  Stable cerebral atrophy.  Stable periventricular and patchy subcortical white matter decreased attenuation consistent with chronic small vessel ischemic changes.  No acute infarction.  No mass lesion is noted on this unenhanced scan.  IMPRESSION: No acute intracranial abnormality.  Stable atrophy and chronic white matter disease.  Bilateral maxillary sinus mucosal thickening.  CT CERVICAL SPINE  Findings: Axial images of the cervical spine shows no acute fracture or subluxation.  Computer processed images shows posterior disc bulge  and calcification of posterior longitudinal ligament at C2-C3 C3-C4 C4-C5 C5-C6 and C 67 level.  There is disc space flattening with vacuum disc phenomenon at C5-C6 and C6-7 level. Mild anterior spurring noted at C4-C5 C5-C6 and C 67 level.  No prevertebral soft tissue swelling.  Degenerative changes are noted C1-C2 articulation.  IMPRESSION: No acute fracture or subluxation.  Multilevel degenerative changes as described above.   Original Report Authenticated By: Natasha Mead, M.D.    Ct Cervical Spine Wo Contrast  03/13/2012  *RADIOLOGY REPORT*  Clinical Data:  Fall  CT HEAD WITHOUT CONTRAST CT CERVICAL SPINE WITHOUT CONTRAST  Technique:  Multidetector CT imaging of the head and cervical spine was performed following the standard protocol without intravenous contrast.  Multiplanar CT image reconstructions of the cervical spine were also generated.  Comparison:  08/17/2011  CT HEAD   Findings: No skull fracture is noted.  There is mucosal thickening bilateral maxillary sinus.  The mastoid air cells are unremarkable.  No intracranial hemorrhage, mass effect or midline shift.  Stable cerebral atrophy.  Stable periventricular and patchy subcortical white matter decreased attenuation consistent with chronic small vessel ischemic changes.  No acute infarction.  No mass lesion is noted on this unenhanced scan.  IMPRESSION: No acute intracranial abnormality.  Stable atrophy and chronic white matter disease.  Bilateral maxillary sinus mucosal thickening.  CT CERVICAL SPINE  Findings: Axial images of the cervical spine shows no acute fracture or subluxation.  Computer processed images shows posterior disc bulge and calcification of posterior longitudinal ligament at C2-C3 C3-C4 C4-C5 C5-C6 and C 67 level.  There is disc space flattening with vacuum disc phenomenon at C5-C6 and C6-7 level. Mild anterior spurring noted at C4-C5 C5-C6 and C 67 level.  No prevertebral soft tissue swelling.  Degenerative changes are noted C1-C2 articulation.  IMPRESSION: No acute fracture or subluxation.  Multilevel degenerative changes as described above.   Original Report Authenticated By: Natasha Mead, M.D.    US Abdomen Complete  03/14/2012  *RADIOLOGY REPORT*  Clinical Data:  Pancreatitis, history of cholecystectomy and appendectomy  COMPLETE ABDOMINAL ULTRASOUND  Comparison:  CT abdomen pelvis - 03/13/2012  Findings:  Gallbladder:  Surgically absent  Common bile duct:  Normal in size for age measuring 7.5 mm in diameter  Liver:  Homogeneous hepatic echotexture.  No discrete hepatic lesions.  No definite evidence of intrahepatic biliary ductal dilatation.  No ascites.  IVC:  Appears normal.  Pancreas:  Appears diffusely thickened compatible with findings on recent abdominal CT.  No definite pancreatic mass or peripancreatic fluid collection.  No definite pancreatic duct dilatation.  Spleen:  Normal in size for age  measuring 6.8 cm in length.  Right Kidney:  Normal cortical thickness, echogenicity and size, measuring 11.9 cm in length.  Incidental note is made of an approximately 1.0 x 0.8 x 0.9 cm partially exophytic anechoic cyst arising from the inferior aspect of the right kidney.  No echogenic renal stones.  No urinary obstruction.  Left Kidney:  Normal cortical thickness, echogenicity and size, measuring 11.9 cm in length.  No focal renal lesions.  No echogenic renal stones.  No urinary obstruction.  Abdominal aorta:  No aneurysm identified.  IMPRESSION: 1.  There is mild diffuse thickening of the pancreatic parenchyma suggestive of pancreatitis as demonstrated on recent abdominal CT. No definite pancreatic ductal dilatation.  No definite intra or extrahepatic biliary duct dilatation.  2.  Right-sided renal cyst.   Original Report Authenticated By: Tacey Ruiz, MD  Dg Chest Port 1 View  03/17/2012  *RADIOLOGY REPORT*  Clinical Data: History of persistent coughing.  Dry coughing. History of hypertension.  PORTABLE CHEST - 1 VIEW  Comparison: 03/13/2012.  Findings: There is moderate cardiac silhouette enlargement which appears larger than on prior study. Ectasia and nonaneurysmal calcification of the thoracic aorta are seen.  There is mild atelectasis in the right lower lung.  No consolidation or pleural effusion is evident.  There is some central peribronchial thickening.  There is degenerative spondylosis.  Cholecystectomy clips are present.  IMPRESSION: Moderate cardiac silhouette enlargement which appears larger than on prior study.  Mild atelectasis in lower portion of right lung. No consolidation.  No pleural effusion.  Central peribronchial thickening is present.  This may be associated with bronchitis, asthma, and reactive airway disease.   Original Report Authenticated By: Onalee Hua Call    Dg Chest Port 1 View  03/13/2012  *RADIOLOGY REPORT*  Clinical Data: Altered mental status  PORTABLE CHEST - 1 VIEW   Comparison: 04/23/2004  Findings: Cardiomediastinal silhouette is stable.  No acute infiltrate or pleural effusion.  No pulmonary edema.  Central mild bronchitic changes again noted.  IMPRESSION: No active disease.  No significant change.   Original Report Authenticated By: Natasha Mead, M.D.     History of Present Illness: Patient is an 79 year old female seen in the office Thurs in reasonable health who had considerable decline 24 hours later.  Hospital Course: Shelba was admitted by me on Friday with AMS and a WBC count of 24.  The prior day it had been 12.  She had complained of lack of appetite but no abdominal pain.  ER eval revealed Lipase >1000.  She was put on clears and CT and U/S eval noted inflammation of the pancreas to be mild, but she is surgically absent her gall bladder and no ductal stone or obstruction was noted.  Within 2 days her memory was back at baseline.  She was put on empiric Vancomycin/Levaquin/Amikacin, which was later decreased to Cipro as her cultures remained negative.  She underwent a gradual refeeding and is now able to eat regular food with no abdominal pain and with a normalized lipase.  SHe had also been noted as having acute renal insufficiency, which resolved with hydration and holding diuretics and ARB.  There was discussion of her rehabbing somewhere.  It was noted that she has a life alert system, but did not use it prior to being found by family on the day of admission.  [facility] was chosen by her daughter and she will rehab there, with a possible decision for permanent placement at their discretion.  During the hospitalization, she complained of cough but had 2 negative CXR's and this has been a chronic issue for her.  She also noted some pain in her L knee following her latest PT.  Exam was unremarkable and no fall occurred.  Given her thoracic fracture last summer, family inquired on interventional radiology as she still has some pain in the area.  [place] has  refused osteoporosis treatments in the past and my concern is that she is recovering from an acute illness and with minimal symptoms, that reexpanding this area would injure the vertebrae above and below her current fracture.  Day of Discharge Exam BP 160/69  Pulse 96  Temp 97.6 F (36.4 C) (Oral)  Resp 20  Ht 5' (1.524 m)  Wt 72.349 kg (159 lb 8 oz)  BMI 31.15 kg/m2  SpO2 98%  Physical Exam: General appearance: appears stated age, delirious and distracted  Neck: no adenopathy, no carotid bruit, no JVD, supple, symmetrical, trachea midline and thyroid not enlarged, symmetric, no tenderness/mass/nodules  Resp: clear to auscultation bilaterally  Cardio: regular rate and rhythm, S1, S2 normal, no murmur, click, rub or gallop  GI: soft, no tenderness; bowel sounds normal; no masses, no organomegaly  Extremities: extremities normal, atraumatic, no cyanosis or edema  Skin: Skin color, texture, turgor normal. No rashes or lesions  Neurologic: Alert and oriented x 3, no focal deficits,.  Discharge Labs:  [facility] 03/17/12 0500 03/16/12 0615  NA 133* 133*  K 3.8 3.7  CL 106 106  CO2 18* 17*  GLUCOSE 90 87  BUN 26* 34*  CREATININE 0.92 1.08  CALCIUM 6.7* 6.9*  MG -- --  PHOS -- --    Basename 03/17/12 0500 03/16/12 0615  AST 16 19  ALT 8 9  ALKPHOS 26* 29*  BILITOT 0.2* 0.3  PROT 5.4* 5.3*  ALBUMIN 1.9* 2.1*    Basename 03/18/12 0630 03/17/12 0500  WBC 14.7* 15.4*  NEUTROABS -- --  HGB 9.1* 9.1*  HCT 25.7* 25.8*  MCV 79.8 80.6  PLT 280 216    Discharge instructions:  PT at facility, continue refeeding and nutrition consult.  Family to decide on need for permanent placement.  Disposition: To [facility] in [place].  Follow-up Appts: Follow-up with Dr. Wylene Simmer at [facility] 1 week following discharge from [facility] Condition on Discharge: Stable, improved  Signed: Shoji Pertuit W 03/18/2012, 8:03 AM

## 2012-03-19 LAB — CULTURE, BLOOD (ROUTINE X 2): Culture: NO GROWTH

## 2012-03-19 LAB — PTH-RELATED PEPTIDE: PTH-related peptide: 43 pg/mL — ABNORMAL HIGH (ref 14–27)

## 2012-03-22 DIAGNOSIS — K859 Acute pancreatitis without necrosis or infection, unspecified: Secondary | ICD-10-CM | POA: Diagnosis not present

## 2012-03-22 DIAGNOSIS — E46 Unspecified protein-calorie malnutrition: Secondary | ICD-10-CM | POA: Diagnosis not present

## 2012-03-22 DIAGNOSIS — D649 Anemia, unspecified: Secondary | ICD-10-CM | POA: Diagnosis not present

## 2012-03-29 DIAGNOSIS — K859 Acute pancreatitis without necrosis or infection, unspecified: Secondary | ICD-10-CM | POA: Diagnosis not present

## 2012-03-29 DIAGNOSIS — D649 Anemia, unspecified: Secondary | ICD-10-CM | POA: Diagnosis not present

## 2012-03-29 DIAGNOSIS — E46 Unspecified protein-calorie malnutrition: Secondary | ICD-10-CM | POA: Diagnosis not present

## 2012-04-05 DIAGNOSIS — D649 Anemia, unspecified: Secondary | ICD-10-CM | POA: Diagnosis not present

## 2012-04-05 DIAGNOSIS — E46 Unspecified protein-calorie malnutrition: Secondary | ICD-10-CM | POA: Diagnosis not present

## 2012-04-05 DIAGNOSIS — K859 Acute pancreatitis without necrosis or infection, unspecified: Secondary | ICD-10-CM | POA: Diagnosis not present

## 2012-04-14 DIAGNOSIS — R5381 Other malaise: Secondary | ICD-10-CM | POA: Diagnosis not present

## 2012-04-14 DIAGNOSIS — E46 Unspecified protein-calorie malnutrition: Secondary | ICD-10-CM | POA: Diagnosis not present

## 2012-04-14 DIAGNOSIS — I10 Essential (primary) hypertension: Secondary | ICD-10-CM | POA: Diagnosis not present

## 2012-04-28 DIAGNOSIS — IMO0001 Reserved for inherently not codable concepts without codable children: Secondary | ICD-10-CM | POA: Diagnosis not present

## 2012-04-28 DIAGNOSIS — D638 Anemia in other chronic diseases classified elsewhere: Secondary | ICD-10-CM | POA: Diagnosis not present

## 2012-04-28 DIAGNOSIS — I10 Essential (primary) hypertension: Secondary | ICD-10-CM | POA: Diagnosis not present

## 2012-04-28 DIAGNOSIS — M810 Age-related osteoporosis without current pathological fracture: Secondary | ICD-10-CM | POA: Diagnosis not present

## 2012-04-28 DIAGNOSIS — R634 Abnormal weight loss: Secondary | ICD-10-CM | POA: Diagnosis not present

## 2012-04-28 DIAGNOSIS — K859 Acute pancreatitis without necrosis or infection, unspecified: Secondary | ICD-10-CM | POA: Diagnosis not present

## 2012-04-28 DIAGNOSIS — E781 Pure hyperglyceridemia: Secondary | ICD-10-CM | POA: Diagnosis not present

## 2012-05-26 DIAGNOSIS — N83209 Unspecified ovarian cyst, unspecified side: Secondary | ICD-10-CM | POA: Diagnosis not present

## 2012-05-26 DIAGNOSIS — R1909 Other intra-abdominal and pelvic swelling, mass and lump: Secondary | ICD-10-CM | POA: Diagnosis not present

## 2012-06-05 DIAGNOSIS — I10 Essential (primary) hypertension: Secondary | ICD-10-CM | POA: Diagnosis not present

## 2012-06-05 DIAGNOSIS — M810 Age-related osteoporosis without current pathological fracture: Secondary | ICD-10-CM | POA: Diagnosis not present

## 2012-06-05 DIAGNOSIS — E781 Pure hyperglyceridemia: Secondary | ICD-10-CM | POA: Diagnosis not present

## 2012-06-05 DIAGNOSIS — K859 Acute pancreatitis without necrosis or infection, unspecified: Secondary | ICD-10-CM | POA: Diagnosis not present

## 2012-06-05 DIAGNOSIS — D638 Anemia in other chronic diseases classified elsewhere: Secondary | ICD-10-CM | POA: Diagnosis not present

## 2012-06-05 DIAGNOSIS — K219 Gastro-esophageal reflux disease without esophagitis: Secondary | ICD-10-CM | POA: Diagnosis not present

## 2012-06-05 DIAGNOSIS — R634 Abnormal weight loss: Secondary | ICD-10-CM | POA: Diagnosis not present

## 2012-06-05 DIAGNOSIS — E559 Vitamin D deficiency, unspecified: Secondary | ICD-10-CM | POA: Diagnosis not present

## 2012-07-22 DIAGNOSIS — L918 Other hypertrophic disorders of the skin: Secondary | ICD-10-CM | POA: Diagnosis not present

## 2012-08-10 DIAGNOSIS — L918 Other hypertrophic disorders of the skin: Secondary | ICD-10-CM | POA: Diagnosis not present

## 2012-08-10 DIAGNOSIS — L270 Generalized skin eruption due to drugs and medicaments taken internally: Secondary | ICD-10-CM | POA: Diagnosis not present

## 2012-08-10 DIAGNOSIS — L259 Unspecified contact dermatitis, unspecified cause: Secondary | ICD-10-CM | POA: Diagnosis not present

## 2012-08-24 DIAGNOSIS — R69 Illness, unspecified: Secondary | ICD-10-CM | POA: Diagnosis not present

## 2012-08-24 DIAGNOSIS — L918 Other hypertrophic disorders of the skin: Secondary | ICD-10-CM | POA: Diagnosis not present

## 2012-08-28 ENCOUNTER — Encounter (HOSPITAL_COMMUNITY): Payer: Self-pay | Admitting: *Deleted

## 2012-08-28 ENCOUNTER — Inpatient Hospital Stay (HOSPITAL_COMMUNITY)
Admission: EM | Admit: 2012-08-28 | Discharge: 2012-09-03 | DRG: 078 | Disposition: A | Discharge status: Skilled Nursing Facility | Payer: Medicare Other | Attending: Internal Medicine | Admitting: Internal Medicine

## 2012-08-28 ENCOUNTER — Emergency Department (HOSPITAL_COMMUNITY): Payer: Medicare Other

## 2012-08-28 DIAGNOSIS — R51 Headache: Secondary | ICD-10-CM | POA: Diagnosis present

## 2012-08-28 DIAGNOSIS — L0390 Cellulitis, unspecified: Secondary | ICD-10-CM | POA: Diagnosis present

## 2012-08-28 DIAGNOSIS — R4182 Altered mental status, unspecified: Secondary | ICD-10-CM | POA: Diagnosis not present

## 2012-08-28 DIAGNOSIS — L0291 Cutaneous abscess, unspecified: Secondary | ICD-10-CM | POA: Diagnosis present

## 2012-08-28 DIAGNOSIS — R5381 Other malaise: Secondary | ICD-10-CM | POA: Diagnosis present

## 2012-08-28 DIAGNOSIS — F05 Delirium due to known physiological condition: Secondary | ICD-10-CM | POA: Diagnosis not present

## 2012-08-28 DIAGNOSIS — D638 Anemia in other chronic diseases classified elsewhere: Secondary | ICD-10-CM | POA: Diagnosis present

## 2012-08-28 DIAGNOSIS — I679 Cerebrovascular disease, unspecified: Secondary | ICD-10-CM | POA: Diagnosis not present

## 2012-08-28 DIAGNOSIS — F411 Generalized anxiety disorder: Secondary | ICD-10-CM | POA: Diagnosis present

## 2012-08-28 DIAGNOSIS — F0390 Unspecified dementia without behavioral disturbance: Secondary | ICD-10-CM | POA: Diagnosis present

## 2012-08-28 DIAGNOSIS — I6990 Unspecified sequelae of unspecified cerebrovascular disease: Secondary | ICD-10-CM | POA: Diagnosis not present

## 2012-08-28 DIAGNOSIS — A4902 Methicillin resistant Staphylococcus aureus infection, unspecified site: Secondary | ICD-10-CM | POA: Diagnosis present

## 2012-08-28 DIAGNOSIS — K5900 Constipation, unspecified: Secondary | ICD-10-CM | POA: Diagnosis not present

## 2012-08-28 DIAGNOSIS — F329 Major depressive disorder, single episode, unspecified: Secondary | ICD-10-CM | POA: Diagnosis present

## 2012-08-28 DIAGNOSIS — I359 Nonrheumatic aortic valve disorder, unspecified: Secondary | ICD-10-CM | POA: Diagnosis present

## 2012-08-28 DIAGNOSIS — K219 Gastro-esophageal reflux disease without esophagitis: Secondary | ICD-10-CM | POA: Diagnosis present

## 2012-08-28 DIAGNOSIS — M25559 Pain in unspecified hip: Secondary | ICD-10-CM | POA: Diagnosis not present

## 2012-08-28 DIAGNOSIS — Z5189 Encounter for other specified aftercare: Secondary | ICD-10-CM | POA: Diagnosis not present

## 2012-08-28 DIAGNOSIS — Z9181 History of falling: Secondary | ICD-10-CM | POA: Diagnosis not present

## 2012-08-28 DIAGNOSIS — L03818 Cellulitis of other sites: Secondary | ICD-10-CM | POA: Diagnosis not present

## 2012-08-28 DIAGNOSIS — I674 Hypertensive encephalopathy: Secondary | ICD-10-CM | POA: Diagnosis not present

## 2012-08-28 DIAGNOSIS — E785 Hyperlipidemia, unspecified: Secondary | ICD-10-CM | POA: Diagnosis present

## 2012-08-28 DIAGNOSIS — T40605A Adverse effect of unspecified narcotics, initial encounter: Secondary | ICD-10-CM | POA: Diagnosis not present

## 2012-08-28 DIAGNOSIS — I658 Occlusion and stenosis of other precerebral arteries: Secondary | ICD-10-CM | POA: Diagnosis not present

## 2012-08-28 DIAGNOSIS — W06XXXA Fall from bed, initial encounter: Secondary | ICD-10-CM | POA: Diagnosis not present

## 2012-08-28 DIAGNOSIS — E871 Hypo-osmolality and hyponatremia: Secondary | ICD-10-CM | POA: Diagnosis not present

## 2012-08-28 DIAGNOSIS — G459 Transient cerebral ischemic attack, unspecified: Secondary | ICD-10-CM | POA: Diagnosis not present

## 2012-08-28 DIAGNOSIS — E7800 Pure hypercholesterolemia, unspecified: Secondary | ICD-10-CM | POA: Diagnosis present

## 2012-08-28 DIAGNOSIS — F3289 Other specified depressive episodes: Secondary | ICD-10-CM | POA: Diagnosis present

## 2012-08-28 DIAGNOSIS — S79919A Unspecified injury of unspecified hip, initial encounter: Secondary | ICD-10-CM | POA: Diagnosis not present

## 2012-08-28 DIAGNOSIS — I10 Essential (primary) hypertension: Secondary | ICD-10-CM | POA: Diagnosis not present

## 2012-08-28 DIAGNOSIS — S0990XA Unspecified injury of head, initial encounter: Secondary | ICD-10-CM | POA: Diagnosis not present

## 2012-08-28 DIAGNOSIS — I119 Hypertensive heart disease without heart failure: Secondary | ICD-10-CM | POA: Diagnosis not present

## 2012-08-28 DIAGNOSIS — I6350 Cerebral infarction due to unspecified occlusion or stenosis of unspecified cerebral artery: Secondary | ICD-10-CM | POA: Diagnosis not present

## 2012-08-28 DIAGNOSIS — I6789 Other cerebrovascular disease: Secondary | ICD-10-CM | POA: Diagnosis not present

## 2012-08-28 HISTORY — DX: Unspecified staphylococcus as the cause of diseases classified elsewhere: B95.8

## 2012-08-28 HISTORY — DX: Methicillin resistant Staphylococcus aureus infection, unspecified site: A49.02

## 2012-08-28 LAB — DIFFERENTIAL
Basophils Absolute: 0.1 10*3/uL (ref 0.0–0.1)
Basophils Relative: 0 % (ref 0–1)
Lymphocytes Relative: 16 % (ref 12–46)
Neutro Abs: 10 10*3/uL — ABNORMAL HIGH (ref 1.7–7.7)
Neutrophils Relative %: 75 % (ref 43–77)

## 2012-08-28 LAB — APTT: aPTT: 29 seconds (ref 24–37)

## 2012-08-28 LAB — COMPREHENSIVE METABOLIC PANEL
ALT: 9 U/L (ref 0–35)
ALT: 9 U/L (ref 0–35)
AST: 19 U/L (ref 0–37)
Albumin: 3.9 g/dL (ref 3.5–5.2)
Alkaline Phosphatase: 56 U/L (ref 39–117)
Alkaline Phosphatase: 61 U/L (ref 39–117)
CO2: 27 mEq/L (ref 19–32)
Chloride: 98 mEq/L (ref 96–112)
GFR calc Af Amer: >90 mL/min (ref >90)
GFR calc non Af Amer: 78 mL/min — ABNORMAL LOW (ref >90)
Glucose, Bld: 137 mg/dL — ABNORMAL HIGH (ref 70–99)
Potassium: 4 mEq/L (ref 3.5–5.1)
Potassium: 4.2 mEq/L (ref 3.5–5.1)
Sodium: 133 mEq/L — ABNORMAL LOW (ref 135–145)
Total Bilirubin: 0.3 mg/dL (ref 0.3–1.2)

## 2012-08-28 LAB — URINALYSIS, ROUTINE W REFLEX MICROSCOPIC
Leukocytes, UA: NEGATIVE
Specific Gravity, Urine: 1.011 (ref 1.005–1.030)
Urobilinogen, UA: 0.2 mg/dL (ref 0.0–1.0)

## 2012-08-28 LAB — URINE MICROSCOPIC-ADD ON

## 2012-08-28 LAB — CBC
Hemoglobin: 12.1 g/dL (ref 12.0–15.0)
Hemoglobin: 13.2 g/dL (ref 12.0–15.0)
MCHC: 34.2 g/dL (ref 30.0–36.0)
RBC: 4.74 MIL/uL (ref 3.87–5.11)
RDW: 15.3 % (ref 11.5–15.5)
WBC: 13.3 10*3/uL — ABNORMAL HIGH (ref 4.0–10.5)
WBC: 14.9 10*3/uL — ABNORMAL HIGH (ref 4.0–10.5)

## 2012-08-28 LAB — PROTIME-INR: INR: 0.97 (ref 0.00–1.49)

## 2012-08-28 LAB — GLUCOSE, CAPILLARY: Glucose-Capillary: 116 mg/dL — ABNORMAL HIGH (ref 70–99)

## 2012-08-28 LAB — POCT I-STAT TROPONIN I: Troponin i, poc: 0.07 ng/mL (ref 0.00–0.08)

## 2012-08-28 MED ORDER — ACETAMINOPHEN 325 MG PO TABS
650.0000 mg | ORAL_TABLET | Freq: Four times a day (QID) | ORAL | Status: DC | PRN
  Administered 2012-09-01 – 2012-09-03 (×4): 650 mg via ORAL
  Filled 2012-08-28 (×5): qty 2

## 2012-08-28 MED ORDER — BISACODYL 5 MG PO TBEC: 5.0000 mg | DELAYED_RELEASE_TABLET | Freq: Every day | ORAL | Status: DC | PRN

## 2012-08-28 MED ORDER — METOPROLOL TARTRATE 12.5 MG HALF TABLET
12.5000 mg | ORAL_TABLET | Freq: Two times a day (BID) | ORAL | Status: DC
  Administered 2012-08-28 – 2012-09-03 (×9): 12.5 mg via ORAL
  Filled 2012-08-28 (×13): qty 1

## 2012-08-28 MED ORDER — HYDRALAZINE HCL 20 MG/ML IJ SOLN
5.0000 mg | Freq: Once | INTRAMUSCULAR | Status: AC
  Administered 2012-08-28: 5 mg via INTRAVENOUS
  Filled 2012-08-28: qty 1

## 2012-08-28 MED ORDER — GUAIFENESIN ER 600 MG PO TB12
600.0000 mg | ORAL_TABLET | Freq: Two times a day (BID) | ORAL | Status: DC
  Filled 2012-08-28 (×4): qty 1

## 2012-08-28 MED ORDER — ONDANSETRON HCL 4 MG/2ML IJ SOLN
4.0000 mg | Freq: Once | INTRAMUSCULAR | Status: AC
  Administered 2012-08-28: 4 mg via INTRAVENOUS
  Filled 2012-08-28: qty 2

## 2012-08-28 MED ORDER — ACETAMINOPHEN 650 MG RE SUPP
650.0000 mg | Freq: Four times a day (QID) | RECTAL | Status: DC | PRN
  Administered 2012-08-29 – 2012-08-30 (×2): 650 mg via RECTAL
  Filled 2012-08-28 (×2): qty 1

## 2012-08-28 MED ORDER — FLUOXETINE HCL 10 MG PO CAPS
10.0000 mg | ORAL_CAPSULE | Freq: Every day | ORAL | Status: DC
  Administered 2012-08-31: 10 mg via ORAL
  Filled 2012-08-28 (×4): qty 1

## 2012-08-28 MED ORDER — SODIUM CHLORIDE 0.9 % IJ SOLN
3.0000 mL | Freq: Two times a day (BID) | INTRAMUSCULAR | Status: DC
  Administered 2012-08-29 – 2012-08-30 (×3): 3 mL via INTRAVENOUS

## 2012-08-28 MED ORDER — CLONIDINE HCL 0.1 MG PO TABS
0.1000 mg | ORAL_TABLET | Freq: Three times a day (TID) | ORAL | Status: DC | PRN
  Administered 2012-08-28 – 2012-09-01 (×2): 0.1 mg via ORAL
  Filled 2012-08-28 (×4): qty 1

## 2012-08-28 MED ORDER — SODIUM CHLORIDE 0.9 % IV SOLN: 250.0000 mL | INTRAVENOUS | Status: DC | PRN

## 2012-08-28 MED ORDER — PANTOPRAZOLE SODIUM 40 MG PO TBEC
40.0000 mg | DELAYED_RELEASE_TABLET | Freq: Every day | ORAL | Status: DC
  Administered 2012-08-31 – 2012-09-03 (×4): 40 mg via ORAL
  Filled 2012-08-28 (×3): qty 1

## 2012-08-28 MED ORDER — LOSARTAN POTASSIUM 50 MG PO TABS
50.0000 mg | ORAL_TABLET | Freq: Two times a day (BID) | ORAL | Status: DC
  Administered 2012-08-29 – 2012-08-31 (×4): 50 mg via ORAL
  Filled 2012-08-28 (×10): qty 1

## 2012-08-28 MED ORDER — SODIUM CHLORIDE 0.9 % IJ SOLN: 3.0000 mL | INTRAMUSCULAR | Status: DC | PRN

## 2012-08-28 MED ORDER — FERROUS FUMARATE 325 (106 FE) MG PO TABS
1.0000 | ORAL_TABLET | Freq: Every day | ORAL | Status: DC
  Administered 2012-08-31 – 2012-09-03 (×4): 106 mg via ORAL
  Filled 2012-08-28 (×6): qty 1

## 2012-08-28 MED ORDER — SODIUM CHLORIDE 0.9 % IJ SOLN
3.0000 mL | Freq: Two times a day (BID) | INTRAMUSCULAR | Status: DC
  Administered 2012-08-28 – 2012-09-01 (×8): 3 mL via INTRAVENOUS

## 2012-08-28 MED ORDER — ASPIRIN 81 MG PO CHEW
81.0000 mg | CHEWABLE_TABLET | Freq: Every day | ORAL | Status: DC
  Filled 2012-08-28: qty 1

## 2012-08-28 MED ORDER — ENOXAPARIN SODIUM 40 MG/0.4ML ~~LOC~~ SOLN
40.0000 mg | SUBCUTANEOUS | Status: DC
  Administered 2012-08-29 – 2012-08-30 (×2): 40 mg via SUBCUTANEOUS
  Filled 2012-08-28 (×2): qty 0.4

## 2012-08-28 MED ORDER — HYDROCODONE-ACETAMINOPHEN 5-325 MG PO TABS
1.0000 | ORAL_TABLET | ORAL | Status: DC | PRN
  Administered 2012-08-28: 1 via ORAL
  Filled 2012-08-28: qty 1

## 2012-08-28 MED ORDER — CALCIUM CARBONATE-VITAMIN D 500-200 MG-UNIT PO TABS
1.0000 | ORAL_TABLET | Freq: Every day | ORAL | Status: DC
  Administered 2012-08-31 – 2012-09-03 (×4): 1 via ORAL
  Filled 2012-08-28 (×6): qty 1

## 2012-08-28 MED ORDER — DOXYCYCLINE HYCLATE 100 MG PO TABS
100.0000 mg | ORAL_TABLET | Freq: Two times a day (BID) | ORAL | Status: DC
  Administered 2012-08-30 – 2012-08-31 (×3): 100 mg via ORAL
  Filled 2012-08-28 (×8): qty 1

## 2012-08-28 MED ORDER — ADULT MULTIVITAMIN W/MINERALS CH
1.0000 | ORAL_TABLET | Freq: Every day | ORAL | Status: DC
  Administered 2012-08-31 – 2012-09-03 (×4): 1 via ORAL
  Filled 2012-08-28 (×6): qty 1

## 2012-08-28 MED ORDER — FLUTICASONE PROPIONATE 50 MCG/ACT NA SUSP
1.0000 | Freq: Every day | NASAL | Status: DC
  Filled 2012-08-28: qty 16

## 2012-08-28 MED ORDER — ALPRAZOLAM 0.25 MG PO TABS
0.2500 mg | ORAL_TABLET | Freq: Three times a day (TID) | ORAL | Status: DC | PRN
  Administered 2012-08-28: 0.25 mg via ORAL
  Filled 2012-08-28: qty 1

## 2012-08-28 MED ORDER — LABETALOL HCL 5 MG/ML IV SOLN
20.0000 mg | Freq: Once | INTRAVENOUS | Status: DC
  Filled 2012-08-28: qty 4

## 2012-08-28 MED ORDER — ACETAMINOPHEN 325 MG PO TABS
650.0000 mg | ORAL_TABLET | Freq: Once | ORAL | Status: AC
  Administered 2012-08-28: 650 mg via ORAL
  Filled 2012-08-28: qty 2

## 2012-08-28 NOTE — ED Provider Notes (Signed)
History    CSN: [id number] Arrival date & time 08/28/12  1519  First MD Initiated Contact with Patient 08/28/12 1643     Chief Complaint  Patient presents with  . Headache   (Consider location/radiation/quality/duration/timing/severity/associated sxs/prior Treatment) HPI Comments: Ms. Bousquet is an 79 yo female with a PMH of HTN, HLD , RBBB and mild AS.  She presents with complaint of headache for the past several weeks that has gotten progressively worse.  The pain is located paranasally in between her eyes and is worse on the L today.  She rates the pain as 10/10.  When the pain began a few weeks ago it would improve with advil but that is no longer helping.   She was brought to the ED today after her daughter noticed R sided facial droop and slurred speech around 2PM today.  Her daughter says the droop lasted for about 5 minutes and her mom had no other weakness and was able to walk to the bathroom on her own.  The patient reports feeling dizzy today.   She denies change in vision or sensitivity to light.  She has no fevers/chills/CP/SOB/sick contacts. Her BP is 209/77 in the ED.    Patient is a 79 y.o. female presenting with headaches.  Headache Associated symptoms: congestion   Associated symptoms: no fatigue and no fever    Past Medical History  Diagnosis Date  . HTN (hypertension)   . Hypercholesteremia   . RBBB   . Mild aortic stenosis   . Chronic fatigue   . Compression fracture of vertebral column   . Ovarian cyst   . Staph infection   . MRSA (methicillin resistant Staphylococcus aureus)    Past Surgical History  Procedure Laterality Date  . Cholecystectomy    . Appendectomy    . Cesarean section    . Athroscopy right shoulder    . Biopsy left breast x 2     Family History  Problem Relation Age of Onset  . Cancer    . Heart disease    . Heart disease Brother   . Heart disease Brother    History  Substance Use Topics  . Smoking status: Never Smoker   .  Smokeless tobacco: Not on file  . Alcohol Use: No   OB History   Grav Para Term Preterm Abortions TAB SAB Ect Mult Living                 Review of Systems  Constitutional: Negative for fever, chills and fatigue.  HENT: Positive for congestion.   Eyes: Negative for visual disturbance.  Respiratory: Negative for shortness of breath.   Cardiovascular: Negative for chest pain.  Neurological: Positive for headaches.    Allergies  Codeine and Penicillins  Home Medications   Current Outpatient Rx  Name  Route  Sig  Dispense  Refill  . aspirin 81 MG tablet   Oral   Take 81 mg by mouth daily.         . calcium-vitamin D (OSCAL WITH D) 500-200 MG-UNIT per tablet   Oral   Take 1 tablet by mouth daily.         . ferrous fumarate (HEMOCYTE - 106 MG FE) 325 (106 FE) MG TABS   Oral   Take 1 tablet by mouth daily.         Marland Kitchen FLUoxetine (PROZAC) 10 MG capsule   Oral   Take 10 mg by mouth daily.         Marland Kitchen  glucosamine-chondroitin 500-400 MG tablet   Oral   Take 1 tablet by mouth daily.         Marland Kitchen guaiFENesin (MUCINEX) 600 MG 12 hr tablet   Oral   Take 1 tablet (600 mg total) by mouth 2 (two) times daily.         Marland Kitchen ibuprofen (ADVIL,MOTRIN) 200 MG tablet   Oral   Take 400 mg by mouth every 6 (six) hours as needed for pain.          Marland Kitchen losartan (COZAAR) 50 MG tablet   Oral   Take 50 mg by mouth daily.         . Multiple Vitamin (MULTIVITAMIN WITH MINERALS) TABS   Oral   Take 1 tablet by mouth daily.         . pantoprazole (PROTONIX) 40 MG tablet   Oral   Take 1 tablet (40 mg total) by mouth daily.         Marland Kitchen doxycycline (VIBRA-TABS) 100 MG tablet   Oral   Take 100 mg by mouth 2 (two) times daily.         . furosemide (LASIX) 20 MG tablet   Oral   Take 20 mg by mouth daily as needed. For edema          BP 209/77  Pulse 75  Temp(Src) 97.9 F (36.6 C) (Oral)  Resp 18  SpO2 95% Physical Exam  Constitutional: She is oriented to person, place,  and time. No distress.  HENT:  Mouth/Throat: Oropharynx is clear and moist.  Eyes: EOM are normal. Pupils are equal, round, and reactive to light.  Neck: Neck supple.  Cardiovascular: Normal rate, regular rhythm and normal heart sounds.   Pulmonary/Chest: Effort normal and breath sounds normal.  Abdominal: Soft. Bowel sounds are normal. She exhibits no distension. There is no tenderness.  Musculoskeletal: Normal range of motion.  Neurological: She is alert and oriented to person, place, and time. No cranial nerve deficit. She exhibits normal muscle tone.  5/5 MMS upper and lower extremity  ED Course  Procedures (including critical care time) Labs Reviewed  CBC - Abnormal; Notable for the following:    WBC 13.3 (*)    HCT 35.4 (*)    All other components within normal limits  DIFFERENTIAL - Abnormal; Notable for the following:    Neutro Abs 10.0 (*)    All other components within normal limits  COMPREHENSIVE METABOLIC PANEL - Abnormal; Notable for the following:    Glucose, Bld 116 (*)    GFR calc non Af Amer 77 (*)    GFR calc Af Amer 89 (*)    All other components within normal limits  GLUCOSE, CAPILLARY - Abnormal; Notable for the following:    Glucose-Capillary 116 (*)    All other components within normal limits  PROTIME-INR  APTT  TROPONIN I  CBC  COMPREHENSIVE METABOLIC PANEL  HEMOGLOBIN A1C  POCT I-STAT TROPONIN I   Ct Head (brain) Wo Contrast  08/28/2012   *RADIOLOGY REPORT*  Clinical Data:  headaches.  CT HEAD WITHOUT CONTRAST  Technique:  Contiguous axial images were obtained from the base of the skull through the vertex without contrast.  Comparison: 03/13/2012  Findings: There is diffuse patchy low density throughout the subcortical and periventricular white matter consistent with chronic small vessel ischemic change.  There is prominence of the sulci and ventricles consistent with brain atrophy.  There is no evidence for acute brain infarct, hemorrhage or mass.  The skull is intact.  There is mild mucosal thickening involving bilateral maxillary sinuses.  The mastoid air cells are clear.  The skull is intact.  IMPRESSION:  1.  Small vessel ischemic disease and brain atrophy. 2.  No acute findings.   Original Report Authenticated By: Signa Kell, M.D.   No diagnosis found.    Date: 08/28/2012  Rate: 72  Rhythm: normal sinus rhythm  QRS Axis: QRS >139ms   Intervals: normal  ST/T Wave abnormalities: normal  Conduction Disutrbances:right bundle branch block  Narrative Interpretation:   Old EKG Reviewed: unchanged (compared to 03/13/12)   MDM  Pt with slurred speech and facial droop lasting ~ 5 minutes observed by daughter today around 2PM.   No weakness, CN deficit or facial asymmetry on exam but her ABCD2 score is 3.  Pt with complaint of headache for several weeks and BP 209/77 in the ED.    Will admit for work-up.  Consult placed to Internal Medicine.  Her primary care doctor is Dr. Wylene Simmer.  I spoke with Dr. Wylene Simmer and he will come in to evaluate patient. Labetalol 20mg  IV and Tylenol 650mg  given in ED.     Evelena Peat, DO 08/28/12 2017

## 2012-08-28 NOTE — ED Notes (Signed)
Per pt and family member, last seen normal by family member yesterday. Pt having headache today and family got to her at 1400 today and pt was having facial droop and slurred speech, symptoms have improved pta. Grips are equal, speech clear, no facial droop, no arm drift noted. bp 209/77

## 2012-08-28 NOTE — H&P (Signed)
PCP:   Gaspar Garbe, MD   Chief Complaint:  Possible facial drop, "jittery"  HPI: Patient is an 79 year old female with history of pancreatitis in the spring.  Was recently at [facility] and diagnosed with MRSA skin infection.  She called my office this morning complaining of being jittery.  Of note, she had stopped her diuretic as well as her antidepressant per report.  Her daughter came to see her this afternoon and thinks that she had a facial droop and took her to the ER.  No facial droop noted on eval, CT head negative, labs negative except for a mild elevation in WBC.  BP elevated, nervous appearing.  Has also been self treating a headache with ibuprofen at home.  I was asked to admit for observation and to complete her TIA evaluation.  Review of Systems:  Review of Systems - Negative except headache and nervousness as noted in HPI Past Medical History: Past Medical History  Diagnosis Date  . HTN (hypertension)   . Hypercholesteremia   . RBBB   . Mild aortic stenosis   . Chronic fatigue   . Compression fracture of vertebral column   . Ovarian cyst   . Staph infection   . MRSA (methicillin resistant Staphylococcus aureus) Pancreatitis 2014 Anxiety Cellulitis, head, 08/27/2012     Past Surgical History  Procedure Laterality Date  . Cholecystectomy    . Appendectomy    . Cesarean section    . Athroscopy right shoulder    . Biopsy left breast x 2      Medications: Prior to Admission medications   Medication Sig Start Date End Date Taking? Authorizing Provider  aspirin 81 MG tablet Take 81 mg by mouth daily.   Yes Historical Provider, MD  calcium-vitamin D (OSCAL WITH D) 500-200 MG-UNIT per tablet Take 1 tablet by mouth daily.   Yes Historical Provider, MD  ferrous fumarate (HEMOCYTE - 106 MG FE) 325 (106 FE) MG TABS Take 1 tablet by mouth daily.   Yes Historical Provider, MD  FLUoxetine (PROZAC) 10 MG capsule Take 10 mg by mouth daily.   Yes Historical Provider,  MD  glucosamine-chondroitin 500-400 MG tablet Take 1 tablet by mouth daily.   Yes Historical Provider, MD  guaiFENesin (MUCINEX) 600 MG 12 hr tablet Take 1 tablet (600 mg total) by mouth 2 (two) times daily. 03/18/12  Yes Gaspar Garbe, MD  ibuprofen (ADVIL,MOTRIN) 200 MG tablet Take 400 mg by mouth every 6 (six) hours as needed for pain.    Yes Historical Provider, MD  losartan (COZAAR) 50 MG tablet Take 50 mg by mouth daily.   Yes Historical Provider, MD  Multiple Vitamin (MULTIVITAMIN WITH MINERALS) TABS Take 1 tablet by mouth daily.   Yes Historical Provider, MD  pantoprazole (PROTONIX) 40 MG tablet Take 1 tablet (40 mg total) by mouth daily. 03/18/12  Yes Gaspar Garbe, MD  doxycycline (VIBRA-TABS) 100 MG tablet Take 100 mg by mouth 2 (two) times daily.    Historical Provider, MD  furosemide (LASIX) 20 MG tablet Take 20 mg by mouth daily as needed. For edema    Historical Provider, MD    Allergies:   Allergies  Allergen Reactions  . Codeine Other (See Comments)    UNKNOWN  . Penicillins Other (See Comments)    rash    Social History:  reports that she has never smoked. She does not have any smokeless tobacco history on file. She reports that she does not drink  alcohol or use illicit drugs.  Family History: Family History  Problem Relation Age of Onset  . Cancer    . Heart disease    . Heart disease Brother   . Heart disease Brother     Physical Exam: Filed Vitals:   08/28/12 1534  BP: 209/77  Pulse: 75  Temp: 97.9 F (36.6 C)  TempSrc: Oral  Resp: 18  SpO2: 95%   General appearance: alert, cooperative and appears stated age Head: Normocephalic, without obvious abnormality, atraumatic Eyes: conjunctivae/corneas clear. PERRL, EOM's intact.  Nose: Nares normal. Septum midline. Mucosa normal. No drainage or sinus tenderness. Throat: lips, mucosa, and tongue normal; teeth and gums normal Neck: no adenopathy, no carotid bruit, no JVD and thyroid not enlarged,  symmetric, no tenderness/mass/nodules Resp: clear to auscultation bilaterally Cardio: regular rate and rhythm, S1, S2 normal, no murmur, click, rub or gallop GI: soft, non-tender; bowel sounds normal; no masses,  no organomegaly Extremities: extremities normal, atraumatic, no cyanosis or edema Pulses: 2+ and symmetric Lymph nodes: Cervical adenopathy: no cervical lymphadenopathy Neurologic: Alert and oriented X 3, normal strength and tone. Normal symmetric reflexes.     Labs on Admission:   Recent Labs  08/28/12 1537  NA 135  K 4.0  CL 98  CO2 26  GLUCOSE 116*  BUN 21  CREATININE 0.65  CALCIUM 10.4    Recent Labs  08/28/12 1537  AST 19  ALT 9  ALKPHOS 56  BILITOT 0.3  PROT 7.6  ALBUMIN 3.9    Recent Labs  08/28/12 1537 08/28/12 1803  WBC 13.3* 14.9*  NEUTROABS 10.0*  --   HGB 12.1 13.2  HCT 35.4* 37.7  MCV 79.4 79.5  PLT 248 270    Recent Labs  08/28/12 1613  TROPONINI <0.30   Lab Results  Component Value Date   INR 0.97 08/28/2012    Radiological Exams on Admission: Ct Head (brain) Wo Contrast  08/28/2012   *RADIOLOGY REPORT*  Clinical Data:  headaches.  CT HEAD WITHOUT CONTRAST  Technique:  Contiguous axial images were obtained from the base of the skull through the vertex without contrast.  Comparison: 03/13/2012  Findings: There is diffuse patchy low density throughout the subcortical and periventricular white matter consistent with chronic small vessel ischemic change.  There is prominence of the sulci and ventricles consistent with brain atrophy.  There is no evidence for acute brain infarct, hemorrhage or mass.  The skull is intact.  There is mild mucosal thickening involving bilateral maxillary sinuses.  The mastoid air cells are clear.  The skull is intact.  IMPRESSION:  1.  Small vessel ischemic disease and brain atrophy. 2.  No acute findings.   Original Report Authenticated By: Signa Kell, M.D.   EKG 12 lead nonacute  Assessment/Plan 1)  ? TIA, witnessed by family member, not by patient.  WIll order MRI and if negative, no further workup as I feel this is unlikely to be her actual presentation.  Of note, she has had an ER eval about 6 weeks ago without admission for this anxious feeling which was also negative.  Of note, she has self d/ced her Prozac, which had been helping her. 2) HTN, malignant- Likely anxiety related.  WIll increase her Cozaar and give 50mg  more tonight as well as adding low dose beta blocker.  She has chronic edema in her ankles, so will avoid CCB.  Avoid NSAIDs as likely making this worse.  Despite her BP being 200-216 systolic, the ER has not  given her any meds to get it down.  I have ordered 5mg  IV hydralazine since it will likely be a while before getting her evening meds. 3) Anxiety- As above, will provide PRN meds. 4) MRSA cellulitis- Doxy is appropriate, will maintain.  Check u/a due to elevated WBC. 5) Anemia of chronic disease- Continue daily Iron 6) Headache- Possibly related to sinuses, will add nasal steroid.  No opacification c/w sinusitis and no intracranial cause on CT.  Observation admission, if MRI normal and BP down, likely home in AM.  Quillan Whitter W 08/28/2012, 6:29 PM

## 2012-08-29 ENCOUNTER — Observation Stay (HOSPITAL_COMMUNITY): Payer: Medicare Other

## 2012-08-29 ENCOUNTER — Inpatient Hospital Stay (HOSPITAL_COMMUNITY): Payer: Medicare Other

## 2012-08-29 DIAGNOSIS — G459 Transient cerebral ischemic attack, unspecified: Secondary | ICD-10-CM

## 2012-08-29 DIAGNOSIS — I6350 Cerebral infarction due to unspecified occlusion or stenosis of unspecified cerebral artery: Secondary | ICD-10-CM | POA: Diagnosis not present

## 2012-08-29 LAB — BASIC METABOLIC PANEL
Calcium: 10 mg/dL (ref 8.4–10.5)
GFR calc Af Amer: 65 mL/min — ABNORMAL LOW (ref >90)
GFR calc non Af Amer: 56 mL/min — ABNORMAL LOW (ref >90)
Sodium: 133 mEq/L — ABNORMAL LOW (ref 135–145)

## 2012-08-29 LAB — CBC
MCH: 27.3 pg (ref 26.0–34.0)
MCHC: 34.5 g/dL (ref 30.0–36.0)
Platelets: 237 10*3/uL (ref 150–400)
RBC: 4.14 MIL/uL (ref 3.87–5.11)
RDW: 15.2 % (ref 11.5–15.5)

## 2012-08-29 LAB — GLUCOSE, CAPILLARY: Glucose-Capillary: 130 mg/dL — ABNORMAL HIGH (ref 70–99)

## 2012-08-29 LAB — MRSA PCR SCREENING: MRSA by PCR: NEGATIVE

## 2012-08-29 MED ORDER — ASPIRIN 300 MG RE SUPP
300.0000 mg | Freq: Every day | RECTAL | Status: DC
  Administered 2012-08-29 – 2012-08-31 (×3): 300 mg via RECTAL
  Filled 2012-08-29 (×3): qty 1

## 2012-08-29 MED ORDER — LORAZEPAM 2 MG/ML IJ SOLN
1.0000 mg | Freq: Once | INTRAMUSCULAR | Status: AC
  Administered 2012-08-29: 1 mg via INTRAVENOUS

## 2012-08-29 MED ORDER — LORAZEPAM 2 MG/ML IJ SOLN
INTRAMUSCULAR | Status: AC
  Filled 2012-08-29: qty 1

## 2012-08-29 NOTE — Progress Notes (Signed)
SLP Cancellation Note  Patient Details Name: MISK GALENTINE MRN: [id number] DOB: 1922-10-28   Cancelled swallow assessment:       Reason Eval/Treat Not Completed: Pt remains sedated after Ativan.  (attempted x2) Will f/u next date.   Blenda Mounts Laurice 08/29/2012, 11:55 AM

## 2012-08-29 NOTE — Progress Notes (Signed)
Utilization review complete 

## 2012-08-29 NOTE — Consult Note (Signed)
Referring Physician: [facility]     Chief Complaint: CODE STROKE: confusion, agitation  HPI:                                                                                                                                         Mallory Jensen is an 79 y.o. female with a past medical history significant for HTN, hypercholesterolemia, mild aortic stenosis,  MRSA, compression fracture vertebral column, admitted to the hospital on 7/18 for TIA work up as she developedR facial droop and slurred speech that lasted for approximately 5 minutes. On aspirin 81 mg daily. She was last known well by nursing staff at 3 am today, who around 6 am developed acute confusion and agitation without reported focal findings on initial neurological assessment by our rapid response nurse. She was so agitated that required 1 mg IV ativan in order to perform STAT CT brain. CT brain revealed no acute intracranial abnormality. Currently sedated post IV ativan.   Date last known well:  Time last known well: 3 am tPA Given no, NIHSS 0 NIHSS: 0  Past Medical History  Diagnosis Date  . HTN (hypertension)   . Hypercholesteremia   . RBBB   . Mild aortic stenosis   . Chronic fatigue   . Compression fracture of vertebral column   . Ovarian cyst   . Staph infection   . MRSA (methicillin resistant Staphylococcus aureus)     Past Surgical History  Procedure Laterality Date  . Cholecystectomy    . Appendectomy    . Cesarean section    . Athroscopy right shoulder    . Biopsy left breast x 2      Family History  Problem Relation Age of Onset  . Cancer    . Heart disease    . Heart disease Brother   . Heart disease Brother    Social History:  reports that she has never smoked. She does not have any smokeless tobacco history on file. She reports that she does not drink alcohol or use illicit drugs.  Allergies:  Allergies  Allergen Reactions  . Codeine Other (See Comments)    UNKNOWN  . Penicillins  Other (See Comments)    rash    Medications:  I have reviewed the patient's current medications.  ROS: unable to obtain due to mental status                                                                                                                                       History obtained from chart review  Physical exam: sedated.  Blood pressure 105/41, pulse 85, temperature 98 F (36.7 C), temperature source Oral, resp. rate 18, height 5\' 2"  (1.575 m), weight 64.864 kg (143 lb), SpO2 96.00% Head: normocephalic. Neck: supple, no bruits, no JVD. Cardiac: no murmurs. Lungs: clear. Abdomen: soft, no tender, no mass. Extremities: no edema.    Neurologic Examination:                                                                                                      Mental Status: Very sedated.   Cranial Nerves: II: Discs flat bilaterally; Visual fields unable to assess due to sedation, pupils equal, round, reactive to light III,IV, VI: ptosis not present, extra-ocular motions intact bilaterally V: no tested VII: face seems to be symmetric VIII: hearing can not assessed IX,X: gag reflex no tested XI: bilateral shoulder shrug no tested XII: midline Motor: Sedated but seem to move all limbs symmetrically upon painful stimulation Tone and bulk:normal tone throughout; no atrophy noted Sensory: reacts to pain Deep Tendon Reflexes:  1+ all over  Plantars: Right: downgoing   Left: downgoing Cerebellar: Unable to test at this moment Gait:  Unable to test CV: pulses palpable throughout    Results for orders placed during the hospital encounter of 08/28/12 (from the past 48 hour(s))  PROTIME-INR     Status: None   Collection Time    08/28/12  3:37 PM      Result Value Range   Prothrombin Time 12.7  11.6 - 15.2 seconds   INR 0.97  0.00 - 1.49  APTT      Status: None   Collection Time    08/28/12  3:37 PM      Result Value Range   aPTT 29  24 - 37 seconds  CBC     Status: Abnormal   Collection Time    08/28/12  3:37 PM      Result Value Range   WBC 13.3 (*) 4.0 - 10.5 K/uL   RBC 4.46  3.87 - 5.11 MIL/uL   Hemoglobin 12.1  12.0 - 15.0 g/dL   HCT 16.1 (*) 09.6 - 04.5 %   MCV 79.4  78.0 - 100.0 fL   MCH 27.1  26.0 - 34.0 pg   MCHC 34.2  30.0 - 36.0 g/dL   RDW 16.1  09.6 - 04.5 %   Platelets 248  150 - 400 K/uL  DIFFERENTIAL     Status: Abnormal   Collection Time    08/28/12  3:37 PM      Result Value Range   Neutrophils Relative % 75  43 - 77 %   Neutro Abs 10.0 (*) 1.7 - 7.7 K/uL   Lymphocytes Relative 16  12 - 46 %   Lymphs Abs 2.1  0.7 - 4.0 K/uL   Monocytes Relative 7  3 - 12 %   Monocytes Absolute 1.0  0.1 - 1.0 K/uL   Eosinophils Relative 2  0 - 5 %   Eosinophils Absolute 0.2  0.0 - 0.7 K/uL   Basophils Relative 0  0 - 1 %   Basophils Absolute 0.1  0.0 - 0.1 K/uL  COMPREHENSIVE METABOLIC PANEL     Status: Abnormal   Collection Time    08/28/12  3:37 PM      Result Value Range   Sodium 135  135 - 145 mEq/L   Potassium 4.0  3.5 - 5.1 mEq/L   Chloride 98  96 - 112 mEq/L   CO2 26  19 - 32 mEq/L   Glucose, Bld 116 (*) 70 - 99 mg/dL   BUN 21  6 - 23 mg/dL   Creatinine, Ser 4.09  0.50 - 1.10 mg/dL   Calcium 81.1  8.4 - 91.4 mg/dL   Total Protein 7.6  6.0 - 8.3 g/dL   Albumin 3.9  3.5 - 5.2 g/dL   AST 19  0 - 37 U/L   ALT 9  0 - 35 U/L   Alkaline Phosphatase 56  39 - 117 U/L   Total Bilirubin 0.3  0.3 - 1.2 mg/dL   GFR calc non Af Amer 77 (*) >90 mL/min   GFR calc Af Amer 89 (*) >90 mL/min   Comment:            The eGFR has been calculated     using the CKD EPI equation.     This calculation has not been     validated in all clinical     situations.     eGFR's persistently     <90 mL/min signify     possible Chronic Kidney Disease.  GLUCOSE, CAPILLARY     Status: Abnormal   Collection Time    08/28/12  4:12  PM      Result Value Range   Glucose-Capillary 116 (*) 70 - 99 mg/dL  TROPONIN I     Status: None   Collection Time    08/28/12  4:13 PM      Result Value Range   Troponin I <0.30  <0.30 ng/mL   Comment:            Due to the release kinetics of cTnI,     a negative result within the first hours     of the onset of symptoms does not rule out     myocardial infarction with certainty.     If myocardial infarction is still suspected,     repeat the test at appropriate intervals.  POCT I-STAT TROPONIN I     Status: None   Collection Time    08/28/12  4:42 PM      Result Value Range   Troponin  i, poc 0.01  0.00 - 0.08 ng/mL   Comment 3            Comment: Due to the release kinetics of cTnI,     a negative result within the first hours     of the onset of symptoms does not rule out     myocardial infarction with certainty.     If myocardial infarction is still suspected,     repeat the test at appropriate intervals.  CBC     Status: Abnormal   Collection Time    08/28/12  6:03 PM      Result Value Range   WBC 14.9 (*) 4.0 - 10.5 K/uL   RBC 4.74  3.87 - 5.11 MIL/uL   Hemoglobin 13.2  12.0 - 15.0 g/dL   HCT 19.1  47.8 - 29.5 %   MCV 79.5  78.0 - 100.0 fL   MCH 27.8  26.0 - 34.0 pg   MCHC 35.0  30.0 - 36.0 g/dL   RDW 62.1  30.8 - 65.7 %   Platelets 270  150 - 400 K/uL  COMPREHENSIVE METABOLIC PANEL     Status: Abnormal   Collection Time    08/28/12  6:03 PM      Result Value Range   Sodium 133 (*) 135 - 145 mEq/L   Potassium 4.2  3.5 - 5.1 mEq/L   Chloride 96  96 - 112 mEq/L   CO2 27  19 - 32 mEq/L   Glucose, Bld 137 (*) 70 - 99 mg/dL   BUN 20  6 - 23 mg/dL   Creatinine, Ser 8.46  0.50 - 1.10 mg/dL   Calcium 96.2  8.4 - 95.2 mg/dL   Total Protein 7.9  6.0 - 8.3 g/dL   Albumin 4.0  3.5 - 5.2 g/dL   AST 17  0 - 37 U/L   ALT 9  0 - 35 U/L   Alkaline Phosphatase 61  39 - 117 U/L   Total Bilirubin 0.4  0.3 - 1.2 mg/dL   GFR calc non Af Amer 78 (*) >90 mL/min   GFR calc  Af Amer >90  >90 mL/min   Comment:            The eGFR has been calculated     using the CKD EPI equation.     This calculation has not been     validated in all clinical     situations.     eGFR's persistently     <90 mL/min signify     possible Chronic Kidney Disease.  HEMOGLOBIN A1C     Status: None   Collection Time    08/28/12  6:03 PM      Result Value Range   Hemoglobin A1C 5.6  <5.7 %   Comment: (NOTE)                                                                               According to the ADA Clinical Practice Recommendations for 2011, when     HbA1c is used as a screening test:      >=6.5%   Diagnostic of Diabetes Mellitus               (  if abnormal result is confirmed)     5.7-6.4%   Increased risk of developing Diabetes Mellitus     References:Diagnosis and Classification of Diabetes Mellitus,Diabetes     Care,2011,34(Suppl 1):S62-S69 and Standards of Medical Care in             Diabetes - 2011,Diabetes Care,2011,34 (Suppl 1):S11-S61.   Mean Plasma Glucose 114  <117 mg/dL  LIPASE, BLOOD     Status: None   Collection Time    08/28/12  6:03 PM      Result Value Range   Lipase 15  11 - 59 U/L  POCT I-STAT TROPONIN I     Status: None   Collection Time    08/28/12  6:18 PM      Result Value Range   Troponin i, poc 0.07  0.00 - 0.08 ng/mL   Comment 3            Comment: Due to the release kinetics of cTnI,     a negative result within the first hours     of the onset of symptoms does not rule out     myocardial infarction with certainty.     If myocardial infarction is still suspected,     repeat the test at appropriate intervals.  URINALYSIS, ROUTINE W REFLEX MICROSCOPIC     Status: Abnormal   Collection Time    08/28/12  8:09 PM      Result Value Range   Color, Urine YELLOW  YELLOW   APPearance CLEAR  CLEAR   Specific Gravity, Urine 1.011  1.005 - 1.030   pH 7.5  5.0 - 8.0   Glucose, UA NEGATIVE  NEGATIVE mg/dL   Hgb urine dipstick LARGE (*) NEGATIVE    Bilirubin Urine NEGATIVE  NEGATIVE   Ketones, ur NEGATIVE  NEGATIVE mg/dL   Protein, ur >409 (*) NEGATIVE mg/dL   Urobilinogen, UA 0.2  0.0 - 1.0 mg/dL   Nitrite NEGATIVE  NEGATIVE   Leukocytes, UA NEGATIVE  NEGATIVE  URINE MICROSCOPIC-ADD ON     Status: None   Collection Time    08/28/12  8:09 PM      Result Value Range   Squamous Epithelial / LPF RARE  RARE   WBC, UA 0-2  <3 WBC/hpf   RBC / HPF 11-20  <3 RBC/hpf   Bacteria, UA RARE  RARE  MRSA PCR SCREENING     Status: None   Collection Time    08/29/12 12:16 AM      Result Value Range   MRSA by PCR NEGATIVE  NEGATIVE   Comment:            The GeneXpert MRSA Assay (FDA     approved for NASAL specimens     only), is one component of a     comprehensive MRSA colonization     surveillance program. It is not     intended to diagnose MRSA     infection nor to guide or     monitor treatment for     MRSA infections.  CBC     Status: Abnormal   Collection Time    08/29/12  5:00 AM      Result Value Range   WBC 11.5 (*) 4.0 - 10.5 K/uL   RBC 4.14  3.87 - 5.11 MIL/uL   Hemoglobin 11.3 (*) 12.0 - 15.0 g/dL   HCT 81.1 (*) 91.4 - 78.2 %   MCV 79.2  78.0 - 100.0 fL  MCH 27.3  26.0 - 34.0 pg   MCHC 34.5  30.0 - 36.0 g/dL   RDW 96.2  95.2 - 84.1 %   Platelets 237  150 - 400 K/uL  BASIC METABOLIC PANEL     Status: Abnormal   Collection Time    08/29/12  5:00 AM      Result Value Range   Sodium 133 (*) 135 - 145 mEq/L   Potassium 4.3  3.5 - 5.1 mEq/L   Chloride 97  96 - 112 mEq/L   CO2 29  19 - 32 mEq/L   Glucose, Bld 106 (*) 70 - 99 mg/dL   BUN 18  6 - 23 mg/dL   Creatinine, Ser 3.24  0.50 - 1.10 mg/dL   Calcium 40.1  8.4 - 02.7 mg/dL   GFR calc non Af Amer 56 (*) >90 mL/min   GFR calc Af Amer 65 (*) >90 mL/min   Comment:            The eGFR has been calculated     using the CKD EPI equation.     This calculation has not been     validated in all clinical     situations.     eGFR's persistently     <90 mL/min  signify     possible Chronic Kidney Disease.  GLUCOSE, CAPILLARY     Status: Abnormal   Collection Time    08/29/12  6:21 AM      Result Value Range   Glucose-Capillary 130 (*) 70 - 99 mg/dL   Ct Head Wo Contrast  08/29/2012   *RADIOLOGY REPORT*  Clinical Data: Patient admitted with stroke and, 5:50 this morning the patient noted to be agitated with change in behavior  CT HEAD WITHOUT CONTRAST  Technique:  Contiguous axial images were obtained from the base of the skull through the vertex without contrast.  Comparison: 08/28/2012  Findings: Moderate diffuse atrophy with moderate low attenuation in the deep white matter consistent with chronic involutional change. No hemorrhage, vascular territory infarct, or extra-axial fluid. No hydrocephalus or mass.  Calvarium is intact.  IMPRESSION: No change from recent prior study.  No acute findings.   Original Report Authenticated By: Esperanza Heir, M.D.   Ct Head (brain) Wo Contrast  08/28/2012   *RADIOLOGY REPORT*  Clinical Data:  headaches.  CT HEAD WITHOUT CONTRAST  Technique:  Contiguous axial images were obtained from the base of the skull through the vertex without contrast.  Comparison: 03/13/2012  Findings: There is diffuse patchy low density throughout the subcortical and periventricular white matter consistent with chronic small vessel ischemic change.  There is prominence of the sulci and ventricles consistent with brain atrophy.  There is no evidence for acute brain infarct, hemorrhage or mass.  The skull is intact.  There is mild mucosal thickening involving bilateral maxillary sinuses.  The mastoid air cells are clear.  The skull is intact.  IMPRESSION:  1.  Small vessel ischemic disease and brain atrophy. 2.  No acute findings.   Original Report Authenticated By: Signa Kell, M.D.     Assessment: 79 y.o. female admitted for further evaluation of possible TIA, now with acute onset agitation, confusion that developed around 6 am today. Had a  non focal neurological exam on initial evaluation. She is now very sedated after IV ativan which precludes a reliable exam. CT normal. Can not entirely exclude an acute cerebrovascular insult causing acute confusional state with agitation. No a candidate for thrombolysis at  this moment, as NIHSS 0.  Will get MRI-DWI and complete stroke work up. Continue aspirin 81 mg daily if able to take PO, otherwise switch to rectal aspirin. Will ask stroke team to continue to follow.  Stroke Risk Factors - age, HTN, hypercholestrolemia  Plan: 1. HgbA1c, fasting lipid panel 2. MRI, MRA  of the brain without contrast 3. Echocardiogram 4. Carotid dopplers 5. Prophylactic therapy-aspirin 81 mg daily 6. Risk factor modification 7. Telemetry monitoring 8. Frequent neuro checks 9. PT/OT SLP   Wyatt Portela, MD [facility] [phone]  08/29/2012, 7:24 AM

## 2012-08-29 NOTE — Progress Notes (Signed)
MD made aware of last BP read (170s/150s). Patient jerking arm while cuff reading, makes me believe the reading to not be accurate, MD Tisovec believes so too. No new orders. Patient sleeping comfortably with daughters present, awaiting second try at MRI soon  Minor, Yvette Rack

## 2012-08-29 NOTE — Progress Notes (Signed)
Pt made NPO per protocol since code stroke called overnight

## 2012-08-29 NOTE — Progress Notes (Signed)
Spoke to Dr Leroy Kennedy who said i could change her ASA to rectal in the setting of NPO status, to call if SBP > 200, and that it is ok to give patient her lovenox this morning  Minor, Morrie Sheldon Anaisa Radi

## 2012-08-29 NOTE — Progress Notes (Signed)
INITIAL NUTRITION ASSESSMENT  DOCUMENTATION CODES Per approved criteria  -Not Applicable   INTERVENTION: Supplement diet when appropriate   NUTRITION DIAGNOSIS: Inadequate oral intake related to inability to eat as evidenced by NPO status.  Goal: Pt to meet >/= 90% of their estimated nutrition needs    Monitor:  Diet advancement, PO intake, weight trend, labs  Reason for Assessment: Pt identified as at nutrition risk on the Malnutrition Screen Tool   79 y.o. female  Admitting Dx: <principal problem not specified>  ASSESSMENT: Pt admitted for facial droop. Felt to have had a possible stroke. Pt sedated for MRI.   Height: Ht Readings from Last 1 Encounters:  08/28/12 5\' 2"  (1.575 m)    Weight: Wt Readings from Last 1 Encounters:  08/28/12 143 lb (64.864 kg)    Ideal Body Weight: 50 kg   % Ideal Body Weight: 129%  Wt Readings from Last 10 Encounters:  08/28/12 143 lb (64.864 kg)  03/17/12 159 lb 8 oz (72.349 kg)  02/20/12 140 lb 12.8 oz (63.866 kg)  08/07/11 132 lb (59.875 kg)    Usual Body Weight: unknown  % Usual Body Weight: -  BMI:  Body mass index is 26.15 kg/(m^2).  Estimated Nutritional Needs: Kcal: 1300-1500 Protein: 60-70 grams Fluid: >1.5 L/day  Skin: no issues noted  Diet Order: NPO  EDUCATION NEEDS: -No education needs identified at this time  No intake or output data in the 24 hours ending 08/29/12 1237  Last BM: PTA   Labs:   Recent Labs Lab 08/28/12 1537 08/28/12 1803 08/29/12 0500  NA 135 133* 133*  K 4.0 4.2 4.3  CL 98 96 97  CO2 26 27 29   BUN 21 20 18   CREATININE 0.65 0.63 0.89  CALCIUM 10.4 10.4 10.0  GLUCOSE 116* 137* 106*    CBG (last 3)   Recent Labs  08/28/12 1612 08/29/12 0621  GLUCAP 116* 130*    Scheduled Meds: . aspirin  300 mg Rectal Daily  . calcium-vitamin D  1 tablet Oral Daily  . doxycycline  100 mg Oral BID  . enoxaparin (LOVENOX) injection  40 mg Subcutaneous Q24H  . ferrous fumarate   1 tablet Oral Daily  . FLUoxetine  10 mg Oral Daily  . fluticasone  1 spray Each Nare Daily  . guaiFENesin  600 mg Oral BID  . LORazepam      . losartan  50 mg Oral BID  . metoprolol tartrate  12.5 mg Oral BID  . multivitamin with minerals  1 tablet Oral Daily  . pantoprazole  40 mg Oral Daily  . sodium chloride  3 mL Intravenous Q12H  . sodium chloride  3 mL Intravenous Q12H    Continuous Infusions:   Past Medical History  Diagnosis Date  . HTN (hypertension)   . Hypercholesteremia   . RBBB   . Mild aortic stenosis   . Chronic fatigue   . Compression fracture of vertebral column   . Ovarian cyst   . Staph infection   . MRSA (methicillin resistant Staphylococcus aureus)     Past Surgical History  Procedure Laterality Date  . Cholecystectomy    . Appendectomy    . Cesarean section    . Athroscopy right shoulder    . Biopsy left breast x 2      Kendell Bane RD, LDN, CNSC [phone] Pager [phone] After Hours Pager

## 2012-08-29 NOTE — ED Provider Notes (Signed)
  I performed a history and physical examination of Mallory Jensen and discussed her management with Dr. Andrey Campanile.  I agree with the history, physical, assessment, and plan of care, with the following exceptions: None  I was present for the following procedures: None Time Spent in Critical Care of the patient: None Time spent in discussions with the patient and family: 50   79 y.o. Female presents today with episode of facial paralysis, resolved, hypertension and headache.  Patient with normal head ct.  And normal neuro exam.  Labetalol iv ordered.  Dr. Wylene Simmer saw and admitted patient. Holli Humbles, MD 08/29/12 1505

## 2012-08-29 NOTE — Progress Notes (Signed)
Spoke to Dr Wylene Simmer in person about the MRI not being able to be done due to patient wiggling on table. He told me to ask Neuro team if they wanted the MRI re-tried with Ativan or not   Mallory Jensen, Mallory Jensen

## 2012-08-29 NOTE — Progress Notes (Signed)
MRI-DWI showed no acute stroke or other lesions to explain patient's agitated/confused state from this morning. She is still not fully responsive, although the nursing staff reports that she open her eyes and offers some resistance to exam. Of note, she received a dose of Vicodin just few hours before this morning acute confusional/agitated state and I presume this could be a potential reason for such changes. Will get EEG in am. Family updated. Wyatt Portela, MD

## 2012-08-29 NOTE — Significant Event (Addendum)
Rapid Response Event Note AMS  Overview: Time Called: 0611 Arrival Time: 0613 Event Type: Neurologic  Initial Focused Assessment:  Called by primary RN to see pt for acute onset confusion & agitation.  On arrival pt is thrashing in bed & moaning.  MAE but will not f/c. NIH 6 for language no other focal symptoms. Pt was able to MAE without difficulty & was able to purposely push this RN away when a sternal rub was performed. Neuro exam was difficult due to the pt's extreme rolling around in bed & constant thrashing. Interventions:  CT Head Ativan 1mg  IV Event Summary: Name of Physician Notified: L. Thad Ranger, MD at [phone]  Name of Consulting Physician Notified: Wylene Simmer, MD at [phone]  Dr. Thad Ranger was called & updated & recommendations for labs & CT Head were obtained.  Dr. Wylene Simmer was then paged & given Dr. Thad Ranger recommendations as well as updating him with the specific nonfocal neuro findings & the patients constant thrashing in bed.   Dr. Wylene Simmer wanted a code stroke  called since neuro had not been consulted as of this time.  Code stroke was called. Order for Ativan 1 mg IV was also rcvd from Dr. Wylene Simmer for the Ct scan to be performed.  Pt was transported to CT scan & seen by Dr. Thad Ranger at the time of Ativan administration.  Pt was sedated but able to MAE with stimulation at the time she was returned to her room after the CT scan.     Juquan Reznick Hedgecock

## 2012-08-29 NOTE — Progress Notes (Signed)
The nurse tech took the patient's vital signs at approximately 0550 and stated that the patient seemed normal at this time.  At 0605, the patient was found to have pulled all of her telemetry leads off.  She was rolling around in the bed and could not articulate any words.  She could not follow commands.  Her previous neuro assessment showed no abnormalities.  Rapid response was called and the MD was notified. Rapid response assessed the patient and also spoke with the MD.  New orders were given for a STAT CT scan.  She received 1 mg of IV Ativan in radiology.  A code stroke was called on the patient and the Stroke Team came to assess the patient.  Report was handed off to the oncoming nurse, and rapid response was called again at 0730 due to concerns of oversedation.

## 2012-08-29 NOTE — Progress Notes (Signed)
Patient just returned from MRI. Patient moderately arousable, responding easily to pain, sounds, commands, but does not follow commands. No verbalization yet. PEERLA. MAE. Keeps pulling covers over herself to sleep. Family in room with patient  Minor, Yvette Rack

## 2012-08-29 NOTE — Progress Notes (Signed)
Subjective: I was called around 6:30 AM this morning with her nurse thinking that Mallory Jensen was having a stroke.  Rapid response had been called.  I asked the nurse to call the Neurohospitalist, since we are an accredited stroke center, to see the patient and help with decision making.  THis was done by phone and yielded an order of sedation and a stat CT which was performed and negative.  She was then seen by the neuro consultant, found to be groggy with a poor exam.  Sent for MRI (which I had ordered at admission for her continued workup) and was sent back to the floor without receiving it because she would not lay still .  Extra sedation was not requested.  I saw her this AM on arrival back to the floor and she is barely arousable due to the sedation.  Objective: Vital signs in last 24 hours: Temp:  [97.6 F (36.4 C)-98.1 F (36.7 C)] 97.6 F (36.4 C) (07/19 0718) Pulse Rate:  [56-85] 60 (07/19 0758) Resp:  [16-18] 16 (07/19 0718) BP: (105-209)/(41-90) 154/90 mmHg (07/19 0758) SpO2:  [94 %-100 %] 100 % (07/19 0758) Weight:  [64.864 kg (143 lb)] 64.864 kg (143 lb) (07/18 2211) Weight change:  Last BM Date: 08/28/12  Intake/Output from previous day:   Intake/Output this shift:   General appearance: Sedated due to ativan, goes back to sleep Head: Normocephalic, without obvious abnormality, atraumatic  Eyes: conjunctivae/corneas clear. PERRL, EOM's intact.  Neck: no adenopathy, no carotid bruit, no JVD and thyroid not enlarged, symmetric, no tenderness/mass/nodules  Resp: clear to auscultation bilaterally  Cardio: regular rate and rhythm, S1, S2 normal, no murmur, click, rub or gallop  GI: soft, non-tender; bowel sounds normal; no masses, no organomegaly  Extremities: extremities normal, atraumatic, no cyanosis or edema  Neuro-  Obtunded, will not follow commands, reflex symmetrical. Lab Results:  Recent Labs  08/28/12 1803 08/29/12 0500  WBC 14.9* 11.5*  HGB 13.2 11.3*  HCT  37.7 32.8*  PLT 270 237   BMET  Recent Labs  08/28/12 1803 08/29/12 0500  NA 133* 133*  K 4.2 4.3  CL 96 97  CO2 27 29  GLUCOSE 137* 106*  BUN 20 18  CREATININE 0.63 0.89  CALCIUM 10.4 10.0    Studies/Results: Ct Head Wo Contrast  08/29/2012   *RADIOLOGY REPORT*  Clinical Data: Patient admitted with stroke and, 5:50 this morning the patient noted to be agitated with change in behavior  CT HEAD WITHOUT CONTRAST  Technique:  Contiguous axial images were obtained from the base of the skull through the vertex without contrast.  Comparison: 08/28/2012  Findings: Moderate diffuse atrophy with moderate low attenuation in the deep white matter consistent with chronic involutional change. No hemorrhage, vascular territory infarct, or extra-axial fluid. No hydrocephalus or mass.  Calvarium is intact.  IMPRESSION: No change from recent prior study.  No acute findings.   Original Report Authenticated By: Esperanza Heir, M.D.   Ct Head (brain) Wo Contrast  08/28/2012   *RADIOLOGY REPORT*  Clinical Data:  headaches.  CT HEAD WITHOUT CONTRAST  Technique:  Contiguous axial images were obtained from the base of the skull through the vertex without contrast.  Comparison: 03/13/2012  Findings: There is diffuse patchy low density throughout the subcortical and periventricular white matter consistent with chronic small vessel ischemic change.  There is prominence of the sulci and ventricles consistent with brain atrophy.  There is no evidence for acute brain infarct, hemorrhage or mass.  The skull is intact.  There is mild mucosal thickening involving bilateral maxillary sinuses.  The mastoid air cells are clear.  The skull is intact.  IMPRESSION:  1.  Small vessel ischemic disease and brain atrophy. 2.  No acute findings.   Original Report Authenticated By: Signa Kell, M.D.    Medications:  I have reviewed the patient's current medications. Scheduled: . aspirin  300 mg Rectal Daily  . calcium-vitamin D   1 tablet Oral Daily  . doxycycline  100 mg Oral BID  . enoxaparin (LOVENOX) injection  40 mg Subcutaneous Q24H  . ferrous fumarate  1 tablet Oral Daily  . FLUoxetine  10 mg Oral Daily  . fluticasone  1 spray Each Nare Daily  . guaiFENesin  600 mg Oral BID  . LORazepam      . losartan  50 mg Oral BID  . metoprolol tartrate  12.5 mg Oral BID  . multivitamin with minerals  1 tablet Oral Daily  . pantoprazole  40 mg Oral Daily  . sodium chloride  3 mL Intravenous Q12H  . sodium chloride  3 mL Intravenous Q12H   Continuous:  MWU:XLKGMW chloride, acetaminophen, acetaminophen, ALPRAZolam, bisacodyl, cloNIDine, HYDROcodone-acetaminophen, sodium chloride  Assessment/Plan: 1) ? TIA, witnessed by family member, not by patient.See HPI, 2nd CT negative, MRI could not be performed due to patient movement and lack of request for further sedation.  Neuro will need to decide on reattempting.  I have ordered the Echo and Carotids on their behalf.  The only new med that she has received is a beta blocker and a 1 time dose of Hydralazine in the ER, otherwise all of her meds are at different doses, but familiar to her.  Cannot tell if theis is a neurologic event or anxiety as not particularly well documented by Rapid Response.  By phone I was told that she was "wiggling like a worm on hot coals" which is not a particularly helpful description, but seemingly rules out a movement deficit. 2) HTN, malignant- Likely anxiety related as well.Cozaar, beta blocker, now PRN Clonidine, and BP much better, not trying to drop below 160, but the 200+ with HA in the ER was unacceptable. 3) Anxiety- As above, will provide PRN meds. Now sedated due to Ativan 4) MRSA cellulitis- Doxy is appropriate, will maintain. Check u/a due to elevated WBC. Held now that she is so sedated that she cannot take PO 5) Anemia of chronic disease- Continue daily Iron  6) Headache- Possibly related to sinuses, will add nasal steroid. No  opacification c/w sinusitis and no intracranial cause on CT. This may also be related to her extreme BP.  Clearly needs further workup, will convert to Inpatient status.   LOS: 1 day   Chinedu Agustin W 08/29/2012, 10:12 AM

## 2012-08-30 LAB — TSH: TSH: 2.965 u[IU]/mL (ref 0.350–4.500)

## 2012-08-30 LAB — BASIC METABOLIC PANEL
BUN: 24 mg/dL — ABNORMAL HIGH (ref 6–23)
Calcium: 9.5 mg/dL (ref 8.4–10.5)
Creatinine, Ser: 1.23 mg/dL — ABNORMAL HIGH (ref 0.50–1.10)
GFR calc Af Amer: 44 mL/min — ABNORMAL LOW (ref >90)

## 2012-08-30 MED ORDER — SODIUM CHLORIDE 0.9 % IV SOLN
INTRAVENOUS | Status: DC
  Administered 2012-08-30: 11:00:00 via INTRAVENOUS

## 2012-08-30 MED ORDER — ENOXAPARIN SODIUM 30 MG/0.3ML ~~LOC~~ SOLN
30.0000 mg | SUBCUTANEOUS | Status: DC
  Administered 2012-08-31: 30 mg via SUBCUTANEOUS
  Filled 2012-08-30: qty 0.3

## 2012-08-30 NOTE — Progress Notes (Signed)
Per night RN, patient awoke last night and was finally talking, calmly following commands, etc. Family was notified. I went in to say good morning, patient said good morning back, and asked how i was. I asked her the year and she said 81, but could tell me her name. Much more alert and interactive this morning compared to yesterday but still sleepy. Will preform full neuro assessment shortly.

## 2012-08-30 NOTE — Progress Notes (Signed)
*  PRELIMINARY RESULTS* Echocardiogram 2D Echocardiogram has been performed.  Mallory Jensen 08/30/2012, 12:31 PM

## 2012-08-30 NOTE — Progress Notes (Signed)
Subjective: Still has effects of sedation.  Daughter at bedside.  Indicated that she will wax and wane for a while until her meds clear.  She is verbal, but sleepy and moves all 4 extremities  Objective: Vital signs in last 24 hours: Temp:  [96.7 F (35.9 C)-99.3 F (37.4 C)] 99 F (37.2 C) (07/20 0520) Pulse Rate:  [61-67] 67 (07/20 0520) Resp:  [18-20] 18 (07/20 0520) BP: (115-175)/(41-150) 115/44 mmHg (07/20 0520) SpO2:  [97 %-100 %] 97 % (07/20 0520) Weight change:  Last BM Date: 08/28/12  Intake/Output from previous day:   Intake/Output this shift:   General appearance: Sedated due to ativan, goes back to sleep  Head: Normocephalic, without obvious abnormality, atraumatic  Eyes: conjunctivae/corneas clear. PERRL, EOM's intact.  Neck: no adenopathy, no carotid bruit, no JVD and thyroid not enlarged, symmetric, no tenderness/mass/nodules  Resp: clear to auscultation bilaterally  Cardio: regular rate and rhythm, S1, S2 normal, no murmur, click, rub or gallop  GI: soft, non-tender; bowel sounds normal; no masses, no organomegaly  Extremities: extremities normal, atraumatic, no cyanosis or edema  Neuro- More alert, but will not follow commands, reflex symmetrical.   Lab Results:  Recent Labs  08/28/12 1803 08/29/12 0500  WBC 14.9* 11.5*  HGB 13.2 11.3*  HCT 37.7 32.8*  PLT 270 237   BMET  Recent Labs  08/29/12 0500 08/30/12 0425  NA 133* 131*  K 4.3 3.8  CL 97 93*  CO2 29 28  GLUCOSE 106* 95  BUN 18 24*  CREATININE 0.89 1.23*  CALCIUM 10.0 9.5    Studies/Results: Ct Head Wo Contrast  08/29/2012   *RADIOLOGY REPORT*  Clinical Data: Patient admitted with stroke and, 5:50 this morning the patient noted to be agitated with change in behavior  CT HEAD WITHOUT CONTRAST  Technique:  Contiguous axial images were obtained from the base of the skull through the vertex without contrast.  Comparison: 08/28/2012  Findings: Moderate diffuse atrophy with moderate low  attenuation in the deep white matter consistent with chronic involutional change. No hemorrhage, vascular territory infarct, or extra-axial fluid. No hydrocephalus or mass.  Calvarium is intact.  IMPRESSION: No change from recent prior study.  No acute findings.   Original Report Authenticated By: Esperanza Heir, M.D.   Ct Head (brain) Wo Contrast  08/28/2012   *RADIOLOGY REPORT*  Clinical Data:  headaches.  CT HEAD WITHOUT CONTRAST  Technique:  Contiguous axial images were obtained from the base of the skull through the vertex without contrast.  Comparison: 03/13/2012  Findings: There is diffuse patchy low density throughout the subcortical and periventricular white matter consistent with chronic small vessel ischemic change.  There is prominence of the sulci and ventricles consistent with brain atrophy.  There is no evidence for acute brain infarct, hemorrhage or mass.  The skull is intact.  There is mild mucosal thickening involving bilateral maxillary sinuses.  The mastoid air cells are clear.  The skull is intact.  IMPRESSION:  1.  Small vessel ischemic disease and brain atrophy. 2.  No acute findings.   Original Report Authenticated By: Signa Kell, M.D.   Mr Brain Wo Contrast  08/29/2012   *RADIOLOGY REPORT*  Clinical Data: 79 year old female with altered mental status, agitated, facial droop.  Suspected stroke.  MRI HEAD WITHOUT CONTRAST  Technique:  Multiplanar, multiecho pulse sequences of the brain and surrounding structures were obtained according to standard protocol without intravenous contrast.  Comparison: Head CTs 08/29/2012 and earlier.  Findings: Wrap artifact on ADC  map imaging. Study is intermittently on other sequences degraded by motion artifact despite repeated imaging attempts.  No restricted diffusion to suggest acute infarction.  Major intracranial vascular flow voids are preserved.  Cerebral volume is within normal limits for age.  No midline shift, ventriculomegaly, mass effect,  evidence of mass lesion, extra-axial collection or acute intracranial hemorrhage.  Cervicomedullary junction and pituitary are within normal limits.  Negative for age visualized cervical spine; multilevel degenerative changes.  No cortical encephalomalacia.  Mild for age scattered cerebral white matter T2 and FLAIR hyperintensity.  Mild for age T2 heterogeneity in the deep gray matter nuclei.  Brainstem and cerebellum within normal limits.  Postoperative changes to the globes. Mild paranasal sinus mucosal thickening.  Trace right mastoid effusion.  Negative nasopharynx. Normal bone marrow signal. Negative scalp soft tissues.  IMPRESSION: No acute intracranial abnormality.  Mild for age nonspecific signal changes in the brain, such as due to chronic small vessel disease. Degenerative changes in the cervical spine.   Original Report Authenticated By: Erskine Speed, M.D.    Medications:  I have reviewed the patient's current medications. Scheduled: . aspirin  300 mg Rectal Daily  . calcium-vitamin D  1 tablet Oral Daily  . doxycycline  100 mg Oral BID  . enoxaparin (LOVENOX) injection  40 mg Subcutaneous Q24H  . ferrous fumarate  1 tablet Oral Daily  . FLUoxetine  10 mg Oral Daily  . fluticasone  1 spray Each Nare Daily  . losartan  50 mg Oral BID  . metoprolol tartrate  12.5 mg Oral BID  . multivitamin with minerals  1 tablet Oral Daily  . pantoprazole  40 mg Oral Daily  . sodium chloride  3 mL Intravenous Q12H  . sodium chloride  3 mL Intravenous Q12H   Continuous: . sodium chloride     RUE:AVWUJW chloride, acetaminophen, acetaminophen, bisacodyl, cloNIDine, sodium chloride  Assessment/Plan: 1) AMS- Posibly related to Vicodin or a hypertensive encephalopathy, with the latter favored.  BP much better now but not certain of cause.  TSH, Cortisol pending, may need to consider pheo workup, but highly unlikely.  Imaging does not show stroke or bleed, Neuro following as well. 2) HTN, malignant-  Likely anxiety related as well.Cozaar, beta blocker, now PRN Clonidine, and BP much better, not trying to drop below 160, but the 200+ with HA in the ER was unacceptable. Now under much better control. 3) Anxiety- As above,No further sedation.  Continue Prozac. 4) MRSA cellulitis- Doxy is appropriate, will maintain. Check u/a due to elevated WBC. Held now that she is so sedated that she cannot take PO  5) Anemia of chronic disease- Continue daily Iron  6) Headache-  This may also be related to her extreme BP.  Possibly related to sinuses, will add nasal steroid. No opacification c/w sinusitis and no intracranial cause on CT.    IVF today and will monitor and assess for reinstituting PO's.   LOS: 2 days   Lovey Crupi W 08/30/2012, 9:50 AM

## 2012-08-30 NOTE — Progress Notes (Signed)
Speech Language Pathology Dysphagia Treatment Patient Details Name: Mallory Jensen MRN: [id number] DOB: 12/02/1922 Today's Date: 08/30/2012 Time: 1700-1733 SLP Time Calculation (min): 33 min  Assessment / Plan / Recommendation Clinical Impression  RN paged treating SLP to reassess swallow for PO readiness as LOA had improved from am.  No outward s/s of aspiration noted throughout evaluation.  Prolonged oral phase noted with regular solids with moderate cues required for patient to continue chewing.  Recommend to proceed wtih dysphagia 3 (mechanical soft) diet consistency and thin liquids with full supervision with meals to cue and assist patient as needed.  ST to follow briefly in acute care setting for diet tolerance and possible advancement.       Diet Recommendation  Initiate / Change Diet: Dysphagia 3 (mechanical soft);Thin liquid    SLP Plan Goals updated      Swallowing Goals  SLP Swallowing Goals Patient will utilize recommended strategies during swallow to increase swallowing safety with: Supervision/safety Swallow Study Goal #3 - Progress: Met  General Temperature Spikes Noted: No Respiratory Status: Room air Behavior/Cognition: Cooperative;Pleasant mood;Requires cueing Oral Cavity - Dentition: Adequate natural dentition Patient Positioning: Upright in bed  Oral Cavity - Oral Hygiene Does patient have any of the following "at risk" factors?: Tongue - coated Patient is HIGH RISK - Oral Care Protocol followed (see row info): Yes   Dysphagia Treatment Treatment focused on: Upgraded PO texture trials;Patient/family/caregiver education;Facilitation of oral preparatory phase;Facilitation of oral phase;Facilitation of pharyngeal phase Family/Caregiver Educated: daughter Treatment Methods/Modalities: Skilled observation;Differential diagnosis Patient observed directly with PO's: Yes Type of PO's observed: Regular;Dysphagia 3 (soft);Dysphagia 1 (puree);Thin liquids Feeding:  Needs assist;Needs set up Liquids provided via: Teaspoon;Cup;Straw Oral Phase Signs & Symptoms: Prolonged mastication;Prolonged bolus formation;Prolonged oral phase Type of cueing: Verbal Amount of cueing: Moderate   GO   Mallory Fowler MS, CCC-SLP [phone]  [facility] 08/30/2012, 5:42 PM

## 2012-08-30 NOTE — Progress Notes (Signed)
VASCULAR LAB PRELIMINARY  PRELIMINARY  PRELIMINARY  PRELIMINARY  Carotid Dopplers completed.    Preliminary report:  Unable to visualize bilateral ICAs and left vertebral arteries secondary to body habitus and agitation.  Right vertebral artery flow is antegrade.  Bricelyn Freestone, RVT 08/30/2012, 10:54 AM

## 2012-08-30 NOTE — Evaluation (Signed)
Clinical/Bedside Swallow Evaluation Patient Details  Name: Mallory Jensen MRN: [id number] Date of Birth: 30-Sep-1922  Today's Date: 08/30/2012 Time: 1000-1027 SLP Time Calculation (min): 27 min  Past Medical History:  Past Medical History  Diagnosis Date  . HTN (hypertension)   . Hypercholesteremia   . RBBB   . Mild aortic stenosis   . Chronic fatigue   . Compression fracture of vertebral column   . Ovarian cyst   . Staph infection   . MRSA (methicillin resistant Staphylococcus aureus)    Past Surgical History:  Past Surgical History  Procedure Laterality Date  . Cholecystectomy    . Appendectomy    . Cesarean section    . Athroscopy right shoulder    . Biopsy left breast x 2     HPI:  Patient is an 79 year old female with history of pancreatitis in the spring.  Was recently at [facility] and diagnosed with MRSA skin infection.  She called my office this morning complaining of being jittery.  Of note, she had stopped her diuretic as well as her antidepressant per report.  Her daughter came to see her this afternoon and thinks that she had a facial droop and took her to the ER.  No facial droop noted on eval, CT head negative, labs negative except for a mild elevation in WBC.  BP elevated, nervous appearing.  Has also been self treating a headache with ibuprofen at home.  I was asked to admit for observation and to complete her TIA evaluation.  BSE ordered per Stroke Protocol.    Assessment / Plan / Recommendation Clinical Impression  Evaluation limited secondary to fluctuating LOA.  Strong urine odor noted when patient repositioned.    Patient would awaken briefly and completed volitional throat clear and cough but unable to maintain LOA for PO trials.  Recommend continued NPO status mainly due to  level of consciousness.  RN to page treating SLP if LOA improves this date.  Hopeful patient is able to resume PO's as cognition improves.      Aspiration Risk  Moderate     Diet Recommendation NPO   Medication Administration: Via alternative means    Other  Recommendations Oral Care Recommendations: Oral care QID   Follow Up Recommendations   (TBD)    Frequency and Duration min 2x/week  2 weeks       SLP Swallow Goals Goal #3: Patient will maintain LOA and sustained attention to consume diagnostic PO trials to determine PO readiness   Swallow Study Prior Functional Status   Lived at home independent with no history of dysphagia     General Date of Onset: 08/28/12 HPI: Patient is an 79 year old female with history of pancreatitis in the spring.  Was recently at [facility] and diagnosed with MRSA skin infection.  She called my office this morning complaining of being jittery.  Of note, she had stopped her diuretic as well as her antidepressant per report.  Her daughter came to see her this afternoon and thinks that she had a facial droop and took her to the ER.  No facial droop noted on eval, CT head negative, labs negative except for a mild elevation in WBC.  BP elevated, nervous appearing.  Has also been self treating a headache with ibuprofen at home.  I was asked to admit for observation and to complete her TIA evaluation. Type of Study: Bedside swallow evaluation Diet Prior to this Study: NPO Temperature Spikes Noted: No  Respiratory Status: Room air History of Recent Intubation: No Behavior/Cognition: Distractible;Confused;Agitated;Decreased sustained attention;Requires cueing Oral Cavity - Dentition: Adequate natural dentition Self-Feeding Abilities: Total assist Patient Positioning: Upright in bed Baseline Vocal Quality: Clear Volitional Cough: Strong Volitional Swallow: Unable to elicit    Oral/Motor/Sensory Function Overall Oral Motor/Sensory Function: Appears within functional limits for tasks assessed   Ice Chips Ice chips: Not tested   Thin Liquid Thin Liquid: Not tested    Nectar Thick Nectar Thick Liquid: Not tested   Honey  Thick Honey Thick Liquid: Not tested   Puree Puree: Not tested   Solid   GO    Solid: Not tested      Moreen Fowler MS, CCC-SLP [phone] [facility] 08/30/2012,10:45 AM

## 2012-08-30 NOTE — Progress Notes (Signed)
Stroke Team Progress Note  HISTORY  Mallory Jensen is an 79 y.o. female with a past medical history significant for HTN, hypercholesterolemia, mild aortic stenosis, MRSA, compression fracture vertebral column, admitted to the hospital on 7/18 for TIA work up as she developed R facial droop and slurred speech that lasted for approximately 5 minutes. On aspirin 81 mg daily.  She was last known well by nursing staff at 3 am 08/29/2012. At about 6 am she developed acute confusion and agitation without reported focal findings on initial neurological assessment by our rapid response nurse.  She was so agitated that she required 1 mg IV ativan in order to perform STAT CT brain.  CT brain revealed no acute intracranial abnormality.   Date last known well:  Time last known well: 3 am  tPA Given no, NIHSS 0  NIHSS: 0  SUBJECTIVE The patient's daughter, son, and daughter-in-law are in the patient's room today. The daughter reports that at approximately 8 AM this morning her mother appeared alert and conversive. The patient did not appear confused at that time. Later however the patient once again became confused and was talking "gibberish" according to the patient's daughter. Currently the patient is sleeping in a fetal position.  OBJECTIVE Most recent Vital Signs: Filed Vitals:   08/29/12 2204 08/30/12 0111 08/30/12 0520 08/30/12 1048  BP: 139/45 137/41 115/44 168/57  Pulse: 62 65 67 70  Temp: 99.3 F (37.4 C) 97.3 F (36.3 C) 99 F (37.2 C) 98.3 F (36.8 C)  TempSrc: Oral Axillary Axillary Oral  Resp: 18 18 18 18   Height:      Weight:      SpO2: 97% 98% 97% 95%   CBG (last 3)   Recent Labs  08/28/12 1612 08/29/12 0621  GLUCAP 116* 130*    IV Fluid Intake:   . sodium chloride      MEDICATIONS  . aspirin  300 mg Rectal Daily  . calcium-vitamin D  1 tablet Oral Daily  . doxycycline  100 mg Oral BID  . enoxaparin (LOVENOX) injection  40 mg Subcutaneous Q24H  . ferrous  fumarate  1 tablet Oral Daily  . FLUoxetine  10 mg Oral Daily  . fluticasone  1 spray Each Nare Daily  . losartan  50 mg Oral BID  . metoprolol tartrate  12.5 mg Oral BID  . multivitamin with minerals  1 tablet Oral Daily  . pantoprazole  40 mg Oral Daily  . sodium chloride  3 mL Intravenous Q12H  . sodium chloride  3 mL Intravenous Q12H   PRN:  sodium chloride, acetaminophen, acetaminophen, bisacodyl, cloNIDine, sodium chloride  Diet:  NPO  Activity:  Up with assistance DVT Prophylaxis:  Lovenox  CLINICALLY SIGNIFICANT STUDIES Basic Metabolic Panel:  Recent Labs Lab 08/29/12 0500 08/30/12 0425  NA 133* 131*  K 4.3 3.8  CL 97 93*  CO2 29 28  GLUCOSE 106* 95  BUN 18 24*  CREATININE 0.89 1.23*  CALCIUM 10.0 9.5   Liver Function Tests:  Recent Labs Lab 08/28/12 1537 08/28/12 1803  AST 19 17  ALT 9 9  ALKPHOS 56 61  BILITOT 0.3 0.4  PROT 7.6 7.9  ALBUMIN 3.9 4.0   CBC:  Recent Labs Lab 08/28/12 1537 08/28/12 1803 08/29/12 0500  WBC 13.3* 14.9* 11.5*  NEUTROABS 10.0*  --   --   HGB 12.1 13.2 11.3*  HCT 35.4* 37.7 32.8*  MCV 79.4 79.5 79.2  PLT 248 270 237   Coagulation:  Recent Labs Lab 08/28/12 1537  LABPROT 12.7  INR 0.97   Cardiac Enzymes:  Recent Labs Lab 08/28/12 1613  TROPONINI <0.30   Urinalysis:  Recent Labs Lab 08/28/12 2009  COLORURINE YELLOW  LABSPEC 1.011  PHURINE 7.5  GLUCOSEU NEGATIVE  HGBUR LARGE*  BILIRUBINUR NEGATIVE  KETONESUR NEGATIVE  PROTEINUR >300*  UROBILINOGEN 0.2  NITRITE NEGATIVE  LEUKOCYTESUR NEGATIVE   Lipid Panel    Component Value Date/Time   CHOL 76 03/16/2012 0615   TRIG 118 03/16/2012 0615   HDL 6* 03/16/2012 0615   CHOLHDL 12.7 03/16/2012 0615   VLDL 24 03/16/2012 0615   LDLCALC 46 03/16/2012 0615   HgbA1C  Lab Results  Component Value Date   HGBA1C 5.6 08/28/2012    Urine Drug Screen:   No results found for this basename: labopia, cocainscrnur, labbenz, amphetmu, thcu, labbarb    Alcohol Level: No  results found for this basename: ETH,  in the last 168 hours  Ct Head Wo Contrast 08/29/2012 No change from recent prior study.  No acute findings.   Ct Head (brain) Wo Contrast 08/28/2012 1.  Small vessel ischemic disease and brain atrophy. 2.  No acute findings.     Mr Brain Wo Contrast 08/29/2012 No acute intracranial abnormality.  Mild for age nonspecific signal changes in the brain, such as due to chronic small vessel disease. Degenerative changes in the cervical spine.      2D Echocardiogram  pending  Carotid Doppler  Preliminary report: Unable to visualize bilateral ICAs and left vertebral arteries secondary to body habitus and agitation. Right vertebral artery flow is antegrade.  CXR    EKG  sinus rhythm rate 72 beats per minute with right bundle branch block  Therapy Recommendations pending - n.p.o. per speech therapists  Physical Exam   General - 79 year old female sleeping in a fetal position somewhat difficult to arouse. Uncooperative with attempts to examine. Skin - Warm and dry  NEUROLOGIC:   MENTAL STATUS: Responds to occasional questions. Some answers are appropriate - some are nonsensical. She followed only one command for me. She was able to tell me that she was in the hospital. Other orientation questions were not answered. CRANIAL NERVES: pupils equal and reactive to light although the patient would not open her eyes voluntarily. MOTOR: Moves all extremities to painful stimuli. SENSORY: Unable to assess   ASSESSMENT Mallory Jensen is a 79 y.o. female presenting with right facial droop, dysarthria, agitation and confusion. TPA was not given secondary to early resolution of initial symptoms. Imaging confirmed small vessel disease but no infarct. On aspirin 81 mg orally every day prior to admission. Now on Aspirin suppository 300 mg daily for secondary stroke prevention. Patient with resultant lethargy, confusion, and at times agitation. Work up  underway.  Ddx: hypertensive encephalopathy, infection (incr WBC), medication side effect   Lethargy, confusion, and agitation possibly secondary to Vicodin / Ativan.  Renal insufficiency  - daily bmets ordered  Hypertension  Hyperlipidemia - cholesterol 76 LDL 46 - not on statin therapy  TIA  NPO per speech therapy - NS at 75 cc's per hour.  Mild leukocytosis trending down   Hospital day # 2  TREATMENT/PLAN  Continue Aspirin suppository 300 mg daily for secondary stroke prevention. Change to aspirin 325 mg PO daily when cleared by speech therapy.  Unable to perform carotid Dopplers secondary to patient's body habitus and agitation. Consider MRA or CTA neck for further evaluation when able to cooperate with exam.  EEG pending  Hassel Neth [facility] Pager [phone] 08/30/2012, 11:05 AM  I have personally obtained a history, examined the patient, evaluated imaging results, and formulated the assessment and plan of care. I agree with the above.   Suanne Marker, MD 08/30/2012, 4:17 PM Certified in Neurology, Neurophysiology and Neuroimaging [facility] - Stroke Team  Please refer to [email or web address] for on-call Stroke MD

## 2012-08-31 ENCOUNTER — Inpatient Hospital Stay (HOSPITAL_COMMUNITY): Payer: Medicare Other

## 2012-08-31 ENCOUNTER — Encounter (HOSPITAL_COMMUNITY): Payer: Self-pay

## 2012-08-31 LAB — BASIC METABOLIC PANEL
BUN: 27 mg/dL — ABNORMAL HIGH (ref 6–23)
Calcium: 9.1 mg/dL (ref 8.4–10.5)
Creatinine, Ser: 0.77 mg/dL (ref 0.50–1.10)
GFR calc Af Amer: 84 mL/min — ABNORMAL LOW (ref >90)

## 2012-08-31 MED ORDER — IOHEXOL 350 MG/ML SOLN
80.0000 mL | Freq: Once | INTRAVENOUS | Status: AC | PRN
  Administered 2012-08-31: 80 mL via INTRAVENOUS

## 2012-08-31 MED ORDER — ENOXAPARIN SODIUM 40 MG/0.4ML ~~LOC~~ SOLN
40.0000 mg | SUBCUTANEOUS | Status: DC
  Administered 2012-09-01 – 2012-09-03 (×3): 40 mg via SUBCUTANEOUS
  Filled 2012-08-31 (×3): qty 0.4

## 2012-08-31 MED ORDER — ASPIRIN 325 MG PO TABS
325.0000 mg | ORAL_TABLET | Freq: Every day | ORAL | Status: DC
  Administered 2012-09-01 – 2012-09-03 (×3): 325 mg via ORAL
  Filled 2012-08-31 (×3): qty 1

## 2012-08-31 NOTE — Evaluation (Signed)
Occupational Therapy Evaluation Patient Details Name: Mallory Jensen MRN: [id number] DOB: 1923/01/03 Today's Date: 08/31/2012 Time: 6213-0865 OT Time Calculation (min): 42 min  OT Assessment / Plan / Recommendation History of present illness Mallory Jensen is an 79 y.o. female with a past medical history significant for HTN, hypercholesterolemia, mild aortic stenosis, MRSA, compression fracture vertebral column, admitted to the hospital on 7/18 for TIA work up as she developed R facial droop and slurred speech that lasted for approximately 5 minutes.  CT and MRI negative for acute findings.   Clinical Impression   Pt admitted to rule out TIA. Pt currently with functional limitations due to the deficits listed below (see OT Problem List). Pt demonstrates confusion, impaired balance which impact her ability to perform BADLs Pt will benefit from skilled OT to increase their safety and independence with ADL and functional mobility for ADL to facilitate discharge to venue listed below.      OT Assessment  Patient needs continued OT Services    Follow Up Recommendations  SNF;Supervision/Assistance - 24 hour    Barriers to Discharge Decreased caregiver support    Equipment Recommendations  None recommended by OT    Recommendations for Other Services    Frequency  Min 2X/week    Precautions / Restrictions Precautions Precautions: Fall   Pertinent Vitals/Pain     ADL  Eating/Feeding: Set up Where Assessed - Eating/Feeding: Chair Grooming: Wash/dry hands;Wash/dry face;Teeth care;Supervision/safety Where Assessed - Grooming: Supported sitting Upper Body Bathing: Minimal assistance Where Assessed - Upper Body Bathing: Supported sitting Lower Body Bathing: Moderate assistance Where Assessed - Lower Body Bathing: Supported sit to stand Upper Body Dressing: Moderate assistance Where Assessed - Upper Body Dressing: Unsupported sitting Lower Body Dressing: Moderate  assistance Where Assessed - Lower Body Dressing: Supported sit to Pharmacist, hospital: +2 Total assistance Toilet Transfer: Patient Percentage: 60% Toilet Transfer Method: Sit to stand;Stand pivot Acupuncturist: Comfort height toilet;Grab bars Toileting - Clothing Manipulation and Hygiene: Moderate assistance Where Assessed - Toileting Clothing Manipulation and Hygiene: Standing Transfers/Ambulation Related to ADLs: Total A +2 (pt ~60%).  Pt leans to the left ADL Comments: Pt incontinent of urine.  Moves quickly in response to incontinence placing her at high risk for falls.  Pt leaning heavily to Lt. when seated on toilet.  Pt pulling at leads/lines - requires redirection to complete simple grooming     OT Diagnosis: Generalized weakness;Cognitive deficits  OT Problem List: Decreased strength;Decreased activity tolerance;Impaired balance (sitting and/or standing);Decreased cognition;Decreased safety awareness;Decreased knowledge of use of DME or AE OT Treatment Interventions: Self-care/ADL training;DME and/or AE instruction;Therapeutic activities;Patient/family education;Balance training;Cognitive remediation/compensation   OT Goals(Current goals can be found in the care plan section) Acute Rehab OT Goals Patient Stated Goal: To go home OT Goal Formulation: With patient/family Time For Goal Achievement: 09/07/12 Potential to Achieve Goals: Good ADL Goals Pt Will Perform Grooming: with min guard assist;standing Pt Will Perform Upper Body Bathing: with supervision;sitting Pt Will Perform Lower Body Bathing: with min guard assist;sit to/from stand Pt Will Perform Upper Body Dressing: with supervision;sitting Pt Will Perform Lower Body Dressing: with min guard assist;sit to/from stand Pt Will Transfer to Toilet: with min guard assist;regular height toilet;grab bars;ambulating Pt Will Perform Toileting - Clothing Manipulation and hygiene: with min guard assist;sit to/from  stand  Visit Information  Last OT Received On: 08/31/12 Assistance Needed: +2 PT/OT Co-Evaluation/Treatment: Yes History of Present Illness: Mallory Jensen is an 79 y.o. female with a past medical history significant  for HTN, hypercholesterolemia, mild aortic stenosis, MRSA, compression fracture vertebral column, admitted to the hospital on 7/18 for TIA work up as she developed R facial droop and slurred speech that lasted for approximately 5 minutes.  CT and MRI negative for acute findings.       Prior Functioning     Home Living Family/patient expects to be discharged to:: Private residence Living Arrangements: Alone Available Help at Discharge: Skilled Nursing Facility Type of Home: House Home Access: Stairs to enter [employer] of Steps: 3 Entrance Stairs-Rails: Right;Left Home Layout: One level Home Equipment: Environmental consultant - 2 wheels;Cane - single point Prior Function Level of Independence: Independent Comments: Pt was driving and performing IADLs independentlhy PTA.  Pt was at [facility] until May 2014 due to admission in jan or feb Communication Communication: HOH Dominant Hand: Right         Vision/Perception Vision - History Patient Visual Report: No change from baseline Vision - Assessment Vision Assessment: Vision not tested Perception Perception: Within Functional Limits Praxis Praxis: Intact   Cognition  Cognition Arousal/Alertness: Awake/alert Behavior During Therapy: WFL for tasks assessed/performed Overall Cognitive Status: Impaired/Different from baseline Area of Impairment: Orientation;Attention;Following commands;Safety/judgement;Awareness;Problem solving Orientation Level: Disoriented to;Time;Situation Current Attention Level: Sustained Following Commands: Follows one step commands with increased time Safety/Judgement: Decreased awareness of safety;Decreased awareness of deficits Awareness: Intellectual Problem Solving: Slow  processing;Difficulty sequencing;Requires verbal cues;Requires tactile cues General Comments: Pt self distracts needing redirection to task; Pt pulling at Lines/leads.  Poor awareness of balance deficits    Extremity/Trunk Assessment Upper Extremity Assessment Upper Extremity Assessment: Generalized weakness Lower Extremity Assessment Lower Extremity Assessment: Defer to PT evaluation Cervical / Trunk Assessment Cervical / Trunk Assessment: Kyphotic     Mobility Bed Mobility Bed Mobility: Rolling Right;Right Sidelying to Sit;Sitting - Scoot to Edge of Bed Rolling Right: 3: Mod assist;With rail Right Sidelying to Sit: 3: Mod assist;With rails Sitting - Scoot to Edge of Bed: 4: Min assist Details for Bed Mobility Assistance: Pt requires assist to roll and lift shoulders from bed.  Pt. demonstrates difficulty problem solving and sequencing task  Transfers Transfers: Sit to Stand;Stand to Sit Sit to Stand: 4: Min assist;With upper extremity assist;From toilet;From bed Stand to Sit: 4: Min assist;With upper extremity assist;To chair/3-in-1;To toilet Details for Transfer Assistance: assist to line up correctly in front of surface, balance and safety      Exercise     Balance     End of Session OT - End of Session Equipment Utilized During Treatment: Gait belt Activity Tolerance: Patient tolerated treatment well Patient left: in chair;with call bell/phone within reach;with family/visitor present;with nursing/sitter in room Nurse Communication: Mobility status  GO     Jeani Hawking M 08/31/2012, 12:05 PM

## 2012-08-31 NOTE — Progress Notes (Signed)
eeg completed ° °

## 2012-08-31 NOTE — Progress Notes (Signed)
Physical Therapy Evaluation Patient Details Name: Mallory Jensen MRN: [id number] DOB: 1922-04-24 Today's Date: 08/31/2012 Time: 4098-1191 PT Time Calculation (min): 53 min  PT Assessment / Plan / Recommendation History of Present Illness  Mallory Jensen is an 79 y.o. female with a past medical history significant for HTN, hypercholesterolemia, mild aortic stenosis, MRSA, compression fracture vertebral column, admitted to the hospital on 7/18 for TIA work up as she developed R facial droop and slurred speech that lasted for approximately 5 minutes.  CT and MRI negative for acute findings.  Clinical Impression  Presents to PT today with below listed impairments (see PT problem list) impacting functional independence and safety. Will benefit physical therapy in the acute setting to maximize safety for d/c to next venue.     PT Assessment       Follow Up Recommendations  SNF    Does the patient have the potential to tolerate intense rehabilitation      Barriers to Discharge Decreased caregiver support      Equipment Recommendations  None recommended by PT    Recommendations for Other Services     Frequency Min 3X/week    Precautions / Restrictions Precautions Precautions: Fall Restrictions Weight Bearing Restrictions: No   Pertinent Vitals/Pain Denies pain, saturated in urine      Mobility  Bed Mobility Bed Mobility: Rolling Right;Right Sidelying to Sit;Sitting - Scoot to Edge of Bed Rolling Right: 3: Mod assist;With rail Right Sidelying to Sit: 3: Mod assist;With rails Sitting - Scoot to Edge of Bed: 4: Min assist Details for Bed Mobility Assistance: Pt requires assist to roll and lift shoulders from bed.  Pt. demonstrates difficulty problem solving and sequencing task  Transfers Sit to Stand: 4: Min assist;With upper extremity assist;From toilet;From bed Stand to Sit: 4: Min assist;With upper extremity assist;To chair/3-in-1;To toilet Details for Transfer  Assistance: assist to line up correctly in front of surface, balance and safety  Ambulation/Gait Ambulation/Gait Assistance: 3: Mod assist Ambulation Distance (Feet): 20 Feet Assistive device: 2 person hand held assist Ambulation/Gait Assistance Details: facilitation to maintain trunk over BOS, posterior preference and increased lateral sway, max sequencing cues for safety especially with turns Gait Pattern: Step-through pattern;Shuffle;Trunk flexed;Decreased stride length Gait velocity: decreased    Exercises     PT Diagnosis: Difficulty walking;Abnormality of gait;Generalized weakness;Altered mental status  PT Problem List: Decreased strength;Decreased activity tolerance;Decreased balance;Decreased mobility;Decreased cognition;Decreased safety awareness;Decreased coordination PT Treatment Interventions: DME instruction;Gait training;Functional mobility training;Therapeutic activities;Therapeutic exercise;Balance training;Neuromuscular re-education;Cognitive remediation;Patient/family education     PT Goals(Current goals can be found in the care plan section) Acute Rehab PT Goals Patient Stated Goal: To go home PT Goal Formulation: With patient Time For Goal Achievement: 09/07/12 Potential to Achieve Goals: Good  Visit Information  Last PT Received On: 08/31/12 Assistance Needed: +2 History of Present Illness: Mallory Jensen is an 79 y.o. female with a past medical history significant for HTN, hypercholesterolemia, mild aortic stenosis, MRSA, compression fracture vertebral column, admitted to the hospital on 7/18 for TIA work up as she developed R facial droop and slurred speech that lasted for approximately 5 minutes.  CT and MRI negative for acute findings.       Prior Functioning  Home Living Family/patient expects to be discharged to:: Private residence Living Arrangements: Alone Available Help at Discharge: Skilled Nursing Facility Type of Home: House Home Access:  Stairs to enter [employer] of Steps: 3 Entrance Stairs-Rails: Right;Left Home Layout: One level Home Equipment: Environmental consultant - 2 wheels;Cane -  single point Prior Function Level of Independence: Independent Comments: Pt was driving and performing IADLs independentlhy PTA.  Pt was at [facility] until May 2014 due to admission in jan or feb Communication Communication: HOH Dominant Hand: Right    Cognition  Cognition Arousal/Alertness: Awake/alert Behavior During Therapy: WFL for tasks assessed/performed Overall Cognitive Status: Impaired/Different from baseline Area of Impairment: Orientation;Attention;Following commands;Safety/judgement;Awareness;Problem solving Orientation Level: Disoriented to;Time;Situation Current Attention Level: Sustained Following Commands: Follows one step commands with increased time Safety/Judgement: Decreased awareness of safety;Decreased awareness of deficits Awareness: Intellectual Problem Solving: Slow processing;Difficulty sequencing;Requires verbal cues;Requires tactile cues General Comments: Pt self distracts needing redirection to task; Pt pulling at Lines/leads.  Poor awareness of balance deficits    Extremity/Trunk Assessment Upper Extremity Assessment Upper Extremity Assessment: Generalized weakness Lower Extremity Assessment Lower Extremity Assessment: Generalized weakness Cervical / Trunk Assessment Cervical / Trunk Assessment: Kyphotic   Balance Balance Balance Assessed: Yes Static Standing Balance Static Standing - Balance Support: Bilateral upper extremity supported Static Standing - Level of Assistance: 3: Mod assist Static Standing - Comment/# of Minutes: anterior weight shift for improved static standing x 5 minutes with facilitation at hips, attempts at reaching  End of Session PT - End of Session Equipment Utilized During Treatment: Gait belt Activity Tolerance: Patient tolerated treatment well Patient left: in chair;with call  bell/phone within reach Nurse Communication: Mobility status  GP     [facility] HELEN 08/31/2012, 1:05 PM

## 2012-08-31 NOTE — Progress Notes (Signed)
Subjective: Alert and awake today, a bit more slow.  Was able to eat dinner yesterday.  Daughter present states that she seemed confused after a sneeze last night but this was temporary.  Reviewed her workup again today as the other daughter is present and they had ?'s about UTI (she had negative for infection u/a at admission).  Objective: Vital signs in last 24 hours: Temp:  [97.5 F (36.4 C)-99.2 F (37.3 C)] 98.1 F (36.7 C) (07/21 0625) Pulse Rate:  [62-84] 62 (07/21 0625) Resp:  [16-18] 16 (07/21 0625) BP: (130-172)/(46-62) 172/57 mmHg (07/21 0625) SpO2:  [95 %-97 %] 95 % (07/21 0625) Weight change:  Last BM Date: 08/28/12  Intake/Output from previous day:   Intake/Output this shift:   General appearance: Alert/oriented, but not as sharp as her baseline yet. Head: Normocephalic, without obvious abnormality, atraumatic  Eyes: conjunctivae/corneas clear. PERRL, EOM's intact.  Neck: no adenopathy, no carotid bruit, no JVD and thyroid not enlarged, symmetric, no tenderness/mass/nodules  Resp: clear to auscultation bilaterally  Cardio: regular rate and rhythm, S1, S2 normal, no murmur, click, rub or gallop  GI: soft, non-tender; bowel sounds normal; no masses, no organomegaly  Extremities: extremities normal, atraumatic, no cyanosis or edema  Neuro- A+O x 3, confused as to recent events but mentally appears close to baseline without any movement deficits. Lab Results:  Recent Labs  08/28/12 1803 08/29/12 0500  WBC 14.9* 11.5*  HGB 13.2 11.3*  HCT 37.7 32.8*  PLT 270 237   BMET  Recent Labs  08/30/12 0425 08/31/12 0535  NA 131* 133*  K 3.8 3.6  CL 93* 98  CO2 28 24  GLUCOSE 95 101*  BUN 24* 27*  CREATININE 1.23* 0.77  CALCIUM 9.5 9.1    Studies/Results: Mr Brain Wo Contrast  08/29/2012   *RADIOLOGY REPORT*  Clinical Data: 79 year old female with altered mental status, agitated, facial droop.  Suspected stroke.  MRI HEAD WITHOUT CONTRAST  Technique:   Multiplanar, multiecho pulse sequences of the brain and surrounding structures were obtained according to standard protocol without intravenous contrast.  Comparison: Head CTs 08/29/2012 and earlier.  Findings: Wrap artifact on ADC map imaging. Study is intermittently on other sequences degraded by motion artifact despite repeated imaging attempts.  No restricted diffusion to suggest acute infarction.  Major intracranial vascular flow voids are preserved.  Cerebral volume is within normal limits for age.  No midline shift, ventriculomegaly, mass effect, evidence of mass lesion, extra-axial collection or acute intracranial hemorrhage.  Cervicomedullary junction and pituitary are within normal limits.  Negative for age visualized cervical spine; multilevel degenerative changes.  No cortical encephalomalacia.  Mild for age scattered cerebral white matter T2 and FLAIR hyperintensity.  Mild for age T2 heterogeneity in the deep gray matter nuclei.  Brainstem and cerebellum within normal limits.  Postoperative changes to the globes. Mild paranasal sinus mucosal thickening.  Trace right mastoid effusion.  Negative nasopharynx. Normal bone marrow signal. Negative scalp soft tissues.  IMPRESSION: No acute intracranial abnormality.  Mild for age nonspecific signal changes in the brain, such as due to chronic small vessel disease. Degenerative changes in the cervical spine.   Original Report Authenticated By: Erskine Speed, M.D.    Medications:  I have reviewed the patient's current medications. Scheduled: . aspirin  300 mg Rectal Daily  . calcium-vitamin D  1 tablet Oral Daily  . doxycycline  100 mg Oral BID  . enoxaparin (LOVENOX) injection  30 mg Subcutaneous Q24H  . ferrous fumarate  1 tablet Oral Daily  . FLUoxetine  10 mg Oral Daily  . fluticasone  1 spray Each Nare Daily  . losartan  50 mg Oral BID  . metoprolol tartrate  12.5 mg Oral BID  . multivitamin with minerals  1 tablet Oral Daily  . pantoprazole   40 mg Oral Daily  . sodium chloride  3 mL Intravenous Q12H  . sodium chloride  3 mL Intravenous Q12H   Continuous: . sodium chloride 75 mL/hr at 08/30/12 1118   ZOX:WRUEAV chloride, acetaminophen, acetaminophen, bisacodyl, cloNIDine, sodium chloride  Assessment/Plan: 1) AMS- Posibly related to Vicodin or a hypertensive encephalopathy, with the latter favored. BP much better now but not certain of cause. TSH, Cortisol pending, may need to consider pheo workup, but highly unlikely. Imaging does not show stroke or bleed, Neuro following as well. Resolving with time.  Carotids and EEG pending. 2) HTN, malignant- Likely anxiety related as well.Cozaar, beta blocker, now PRN Clonidine, and BP much better, not trying to drop below 160, but the 200+ with HA in the ER was unacceptable. Now under much better control.   Slightly high this AM due to prior NPO, but will maintain her current regimen, which is increased from time of admission. 3) Anxiety- As above,No further sedation. Continue Prozac.  4) MRSA cellulitis- Doxy is appropriate, will maintain. Check u/a due to elevated WBC. Held now that she is so sedated that she cannot take PO  5) Anemia of chronic disease- Continue daily Iron  6) Headache- No complaint this AM, thought to be due to HTN urgency.  Has steroid nasal spray as well. 7) Hyponatremia- Mild, improved with IVF.  Will continue 1 more day until assured that she is maintaining good PO intake.  Will arrange for PT/OT Care management to work with her today.  Family believes another rehabilitation stay at SNF may be in order and unless she does incredibly well today, I am inclined to agree.  She may benefit from ALF post SNF, but family and her will need to dialogue on this.  I noted to them that insurance covers goal oriented home health only and the rest is out of pocket or may be covered by long term care insurance only.    LOS: 3 days   Wendell Nicoson W 08/31/2012, 8:29 AM

## 2012-08-31 NOTE — Progress Notes (Signed)
Stroke Team Progress Note  HISTORY  Mallory Jensen is an 79 y.o. female with a past medical history significant for HTN, hypercholesterolemia, mild aortic stenosis, MRSA, compression fracture vertebral column, admitted to the hospital on 7/18 for TIA work up as she developed R facial droop and slurred speech that lasted for approximately 5 minutes. On aspirin 81 mg daily.  She was last known well by nursing staff at 3 am 08/29/2012. At about 6 am she developed acute confusion and agitation without reported focal findings on initial neurological assessment by our rapid response nurse.  She was so agitated that she required 1 mg IV ativan in order to perform STAT CT brain.  CT brain revealed no acute intracranial abnormality.   Date last known well:  Time last known well: 3 am  tPA Given no, NIHSS 0  NIHSS: 0  SUBJECTIVE The patient's daughter and daughter-in-law are in the patient's room today. Patient is up in the chair at the bedside. Patient lived alone prior to admission.  OBJECTIVE Most recent Vital Signs: Filed Vitals:   08/30/12 1856 08/30/12 2240 08/31/12 0146 08/31/12 0625  BP: 136/50 130/49 158/46 172/57  Pulse: 84 73 64 62  Temp: 99.2 F (37.3 C) 98.1 F (36.7 C) 98.8 F (37.1 C) 98.1 F (36.7 C)  TempSrc: Oral Oral Oral Oral  Resp: 18 18 16 16   Height:      Weight:      SpO2: 96% 95% 97% 95%   CBG (last 3)   Recent Labs  08/28/12 1612 08/29/12 0621  GLUCAP 116* 130*    IV Fluid Intake:   . sodium chloride 75 mL/hr at 08/30/12 1118    MEDICATIONS  . aspirin  300 mg Rectal Daily  . calcium-vitamin D  1 tablet Oral Daily  . doxycycline  100 mg Oral BID  . enoxaparin (LOVENOX) injection  30 mg Subcutaneous Q24H  . ferrous fumarate  1 tablet Oral Daily  . FLUoxetine  10 mg Oral Daily  . fluticasone  1 spray Each Nare Daily  . losartan  50 mg Oral BID  . metoprolol tartrate  12.5 mg Oral BID  . multivitamin with minerals  1 tablet Oral Daily  .  pantoprazole  40 mg Oral Daily  . sodium chloride  3 mL Intravenous Q12H  . sodium chloride  3 mL Intravenous Q12H   PRN:  sodium chloride, acetaminophen, acetaminophen, bisacodyl, cloNIDine, sodium chloride  Diet:  Dysphagia 3 thin liquids Activity:  Up with assistance DVT Prophylaxis:  Lovenox 30  CLINICALLY SIGNIFICANT STUDIES Basic Metabolic Panel:   Recent Labs Lab 08/30/12 0425 08/31/12 0535  NA 131* 133*  K 3.8 3.6  CL 93* 98  CO2 28 24  GLUCOSE 95 101*  BUN 24* 27*  CREATININE 1.23* 0.77  CALCIUM 9.5 9.1   Liver Function Tests:   Recent Labs Lab 08/28/12 1537 08/28/12 1803  AST 19 17  ALT 9 9  ALKPHOS 56 61  BILITOT 0.3 0.4  PROT 7.6 7.9  ALBUMIN 3.9 4.0   CBC:   Recent Labs Lab 08/28/12 1537 08/28/12 1803 08/29/12 0500  WBC 13.3* 14.9* 11.5*  NEUTROABS 10.0*  --   --   HGB 12.1 13.2 11.3*  HCT 35.4* 37.7 32.8*  MCV 79.4 79.5 79.2  PLT 248 270 237   Coagulation:   Recent Labs Lab 08/28/12 1537  LABPROT 12.7  INR 0.97   Cardiac Enzymes:   Recent Labs Lab 08/28/12 1613  TROPONINI <0.30  Urinalysis:   Recent Labs Lab 08/28/12 2009  COLORURINE YELLOW  LABSPEC 1.011  PHURINE 7.5  GLUCOSEU NEGATIVE  HGBUR LARGE*  BILIRUBINUR NEGATIVE  KETONESUR NEGATIVE  PROTEINUR >300*  UROBILINOGEN 0.2  NITRITE NEGATIVE  LEUKOCYTESUR NEGATIVE   Lipid Panel    Component Value Date/Time   CHOL 76 03/16/2012 0615   TRIG 118 03/16/2012 0615   HDL 6* 03/16/2012 0615   CHOLHDL 12.7 03/16/2012 0615   VLDL 24 03/16/2012 0615   LDLCALC 46 03/16/2012 0615   HgbA1C  Lab Results  Component Value Date   HGBA1C 5.6 08/28/2012    Urine Drug Screen:   No results found for this basename: labopia,  cocainscrnur,  labbenz,  amphetmu,  thcu,  labbarb    Alcohol Level: No results found for this basename: ETH,  in the last 168 hours  Ct Head Wo Contrast 08/29/2012 No change from recent prior study.  No acute findings.  08/28/2012 1.  Small vessel ischemic  disease and brain atrophy. 2.  No acute findings.     MRI Brain Wo Contrast 08/29/2012 No acute intracranial abnormality.  Mild for age nonspecific signal changes in the brain, such as due to chronic small vessel disease. Degenerative changes in the cervical spine.   MRA Brain Wo Contrast  See CTA head  CT angio head  2D Echocardiogram  EF 60-65% with no source of embolus.   Carotid Doppler Unable to visualize bilateral ICAs and left vertebral arteries secondary to body habitus and agitation. Right vertebral artery flow is antegrade.  CXR    EKG  sinus rhythm rate 72 beats per minute with right bundle branch block  Therapy Recommendations OT rec SNF, PT too  Physical Exam   General - 79 year old female sleeping in a fetal position somewhat difficult to arouse. Uncooperative with attempts to examine. Skin - Warm and dry  NEUROLOGIC:  MENTAL STATUS: Responds to occasional questions. Some answers are appropriate - some are nonsensical. She followed only one command for me. She was able to tell me that she was in the hospital. Other orientation questions were not answered. CRANIAL NERVES: pupils equal and reactive to light although the patient would not open her eyes voluntarily. MOTOR: Moves all extremities to painful stimuli. SENSORY: Unable to assess   ASSESSMENT [name] is a 79 y.o. female presenting with right facial droop, dysarthria, agitation and confusion. TPA was not given secondary to early resolution of initial symptoms. Imaging confirmed small vessel disease but no infarct, Dx: left brain TIA. On aspirin 81 mg orally every day prior to admission. Now on Aspirin suppository 300 mg daily for secondary stroke prevention. Patient with resultant lethargy, confusion, and at times agitation - waxes and wanes. Work up underway. effect   Lethargy, confusion, and agitation most likely secondary to Vicodin / Ativan.  Renal insufficiency  - daily bmets  ordered  Hypertension  Hyperlipidemia - cholesterol 76 LDL 46 - not on statin therapy  TIA  Mild leukocytosis, resolving   Likely baseline dementia. Dementia labs unremarkable. Will need OP f/u  Senile tremor   Hospital day # 3  TREATMENT/PLAN  Change apirin suppository to aspirin 325 mg orally every day for secondary stroke prevention. Marland Kitchen  CTA head and CTA neck   F/u EEG pending  OP dementia workup with neurology  Disposition:  SNF  Annie Main, MSN, RN, ANVP-BC, ANP-BC, GNP-BC [facility] Pager: [phone] 08/31/2012 12:15 PM  I have personally obtained a history, examined the  patient, evaluated imaging results, and formulated the assessment and plan of care. I agree with the above. Delia Heady, MD

## 2012-09-01 ENCOUNTER — Encounter (HOSPITAL_COMMUNITY): Payer: Self-pay | Admitting: Radiology

## 2012-09-01 ENCOUNTER — Inpatient Hospital Stay (HOSPITAL_COMMUNITY): Payer: Medicare Other

## 2012-09-01 LAB — BASIC METABOLIC PANEL
CO2: 25 mEq/L (ref 19–32)
Calcium: 8.7 mg/dL (ref 8.4–10.5)
Creatinine, Ser: 0.72 mg/dL (ref 0.50–1.10)
GFR calc non Af Amer: 74 mL/min — ABNORMAL LOW (ref >90)
Glucose, Bld: 124 mg/dL — ABNORMAL HIGH (ref 70–99)
Sodium: 135 mEq/L (ref 135–145)

## 2012-09-01 MED ORDER — AMLODIPINE BESYLATE 5 MG PO TABS
5.0000 mg | ORAL_TABLET | Freq: Every day | ORAL | Status: DC
  Administered 2012-09-01 – 2012-09-03 (×3): 5 mg via ORAL
  Filled 2012-09-01 (×3): qty 1

## 2012-09-01 MED ORDER — LOSARTAN POTASSIUM 50 MG PO TABS
100.0000 mg | ORAL_TABLET | Freq: Two times a day (BID) | ORAL | Status: DC
  Administered 2012-09-01 – 2012-09-03 (×5): 100 mg via ORAL
  Filled 2012-09-01 (×6): qty 2

## 2012-09-01 MED ORDER — WHITE PETROLATUM GEL
Status: AC
  Administered 2012-09-01: 11:00:00
  Filled 2012-09-01: qty 5

## 2012-09-01 MED ORDER — ONDANSETRON HCL 4 MG/2ML IJ SOLN
4.0000 mg | Freq: Once | INTRAMUSCULAR | Status: AC
  Administered 2012-09-01: 4 mg via INTRAVENOUS
  Filled 2012-09-01: qty 2

## 2012-09-01 MED ORDER — DOUBLE ANTIBIOTIC 500-10000 UNIT/GM EX OINT
TOPICAL_OINTMENT | Freq: Two times a day (BID) | CUTANEOUS | Status: DC
  Administered 2012-09-01 – 2012-09-03 (×5): via TOPICAL
  Filled 2012-09-01 (×19): qty 1

## 2012-09-01 MED ORDER — QUETIAPINE 12.5 MG HALF TABLET
12.5000 mg | ORAL_TABLET | Freq: Every day | ORAL | Status: DC
  Administered 2012-09-01: 12.5 mg via ORAL
  Filled 2012-09-01 (×2): qty 1

## 2012-09-01 MED ORDER — DIVALPROEX SODIUM 125 MG PO DR TAB
125.0000 mg | DELAYED_RELEASE_TABLET | Freq: Once | ORAL | Status: AC
  Administered 2012-09-01: 125 mg via ORAL
  Filled 2012-09-01: qty 1

## 2012-09-01 NOTE — Care Management Note (Unsigned)
    Page 1 of 1   09/01/2012     11:20:28 AM   CARE MANAGEMENT NOTE 09/01/2012  Patient:  Mallory Jensen, Mallory Jensen   Account Number:  [id number]  Date Initiated:  09/01/2012  Documentation initiated by:  Elmer Bales  Subjective/Objective Assessment:   Patient admitted for TIA. From home alone     Action/Plan:   Will follow for discharge needs.   Anticipated DC Date:     Anticipated DC Plan:    In-house referral  Clinical Social Worker      DC Planning Services  CM consult      Choice offered to / List presented to:             Status of service:  In process, will continue to follow Medicare Important Message given?   (If response is "NO", the following Medicare IM given date fields will be blank) Date Medicare IM given:   Date Additional Medicare IM given:    Discharge Disposition:    Per UR Regulation:    If discussed at Long Length of Stay Meetings, dates discussed:    Comments:  09/01/12 1115 Elmer Bales RN, MSN CM-  Spoke with [facility], who states that patient is not a candidate for [facility] at this time.  [facility] states that they would be able to take the patient today, but patient may not have enough acuity by tomorrow.  Spoke with Dr Wylene Simmer to relay this information.  At this time, Dr Wylene Simmer does not feel that patient is ready for transfer to [facility] or SNF.  CM will continue to follow for discharge needs.   09/01/12 1030 Courtney Robarge RN, MSN CM- [facility] referral noted.  [facility] and [facility] both notified of referral for review.

## 2012-09-01 NOTE — Clinical Social Work Psychosocial (Signed)
Clinical Social Work Department BRIEF PSYCHOSOCIAL ASSESSMENT 09/01/2012  Patient:  Mallory Jensen, Mallory Jensen     Account Number:  [id number]     Admit date:  08/28/2012  Clinical Social Worker:  Sherre Lain  Date/Time:  09/01/2012 10:22 AM  Referred by:  Physician  Date Referred:  09/01/2012 Referred for  SNF Placement   Other Referral:   none.   Interview type:  Patient Other interview type:   Pt's daughter, Sherran Needs [phone]), was present at bedside.    PSYCHOSOCIAL DATA Living Status:  ALONE Admitted from facility:   Level of care:  Skilled Nursing Facility Primary support name:  Celene Kras, Arline Asp Shutt, [name] Primary support relationship to patient:  CHILD, ADULT Degree of support available:   Pt's daughter reported strong support for pt from pt's three daughters.    CURRENT CONCERNS Current Concerns  None Noted   Other Concerns:   none.    SOCIAL WORK ASSESSMENT / PLAN CSW met with pt at bedside. Pt's daughter, Sherran Needs ([phone]), was also present at bedside.CSW introduced themself and explained CSW role. Pt was alert/oriented and able to communicate to CSW. Pt stated that prior to admission to [facility], she was living alone in her home. Pt's daughter noted that the pt has previously utilized Nurse, learning disability for rehabilitation. Pt's daughter gave verbal consent and understanding for CSW to complete a bed search for placement purposes. Pt's daughter noted that CSW could contact herself and the other two daughters Clydie Braun [phone], [name] [phone]) with any questions or updated information regarding placement. CSW will continue to follow-up with pt and pt's daughters regarding SNF placement.   Assessment/plan status:  Psychosocial Support/Ongoing Assessment of Needs Other assessment/ plan:   none.   Information/referral to community resources:   none.    PATIENTS/FAMILYS RESPONSE TO PLAN OF CARE: Pt and pt's family agreeable and  understanding of SNF placement. CSW to follow-up with pt's daughters regarding placement updates.       Darlyn Chamber, MSW, LCSWA Clinical Social Work [phone]

## 2012-09-01 NOTE — Progress Notes (Signed)
Subjective: Mallory Jensen out of bed last night.  She is confused this AM and confabulating, which is a step back from yesterday AM.  Worked with PT in the AM and didn't have issues until later in the evening.  Family stayed with her overnight.  Objective: Vital signs in last 24 hours: Temp:  [97.9 F (36.6 C)-99.5 F (37.5 C)] 97.9 F (36.6 C) (07/22 0700) Pulse Rate:  [57-66] 57 (07/22 0700) Resp:  [18-19] 18 (07/22 0700) BP: (146-182)/(52-64) 165/56 mmHg (07/22 0700) SpO2:  [95 %-99 %] 96 % (07/22 0700) Weight change:  Last BM Date: 08/31/12  Intake/Output from previous day:   Intake/Output this shift:   General appearance: Alert/oriented to person and place, but not citrcumstance as she thinks that she fell at home and ended up here yesterday. Head: Normocephalic, without obvious abnormality, atraumatic  Eyes: conjunctivae/corneas clear. PERRL, EOM's intact.  Neck: no adenopathy, no carotid bruit, no JVD and thyroid not enlarged, symmetric, no tenderness/mass/nodules  Resp: clear to auscultation bilaterally  Cardio: regular rate and rhythm, S1, S2 normal, no murmur, click, rub or gallop  GI: soft, non-tender; bowel sounds normal; no masses, no organomegaly  Extremities: extremities normal, atraumatic, no cyanosis or edema  Neuro- No movement deficits. Confabulating this AM, can name me and her location.   Lab Results: No results found for this basename: WBC, HGB, HCT, PLT,  in the last 72 hours BMET  Recent Labs  08/31/12 0535 09/01/12 0600  NA 133* 135  K 3.6 3.6  CL 98 102  CO2 24 25  GLUCOSE 101* 124*  BUN 27* 17  CREATININE 0.77 0.72  CALCIUM 9.1 8.7    Studies/Results: Ct Angio Head Jensen/cm &/or Wo Cm  08/31/2012   *RADIOLOGY REPORT*  Clinical Data:  Right-sided facial droop and slurred speech lasting for 5 minutes.  Hypertension.  Hypercholesterolemia.  CT ANGIOGRAPHY HEAD AND NECK  Technique:  Multidetector CT imaging of the head and neck was performed using the  standard protocol during bolus administration of intravenous contrast.  Multiplanar CT image reconstructions including MIPs were obtained to evaluate the vascular anatomy. Carotid stenosis measurements (when applicable) are obtained utilizing NASCET criteria, using the distal internal carotid diameter as the denominator.  Contrast: 80mL OMNIPAQUE IOHEXOL 350 MG/ML SOLN  Comparison:  08/29/2012 MR brain.  CTA NECK  Findings:  Calcified plaque aortic arch.  Normal configuration of the origin of the great vessels.  Artifact extends through the right subclavian artery. Calcification with prominent folds proximal right subclavian artery with mild narrowing.  Calcification with mild to moderate narrowing proximal left subclavian artery.  Calcification with mild narrowing of the proximal vertebral artery bilaterally.  Calcified plaque carotid bifurcation with 56% diameter stenosis of the proximal right internal carotid artery and 58% diameter stenosis of the proximal left internal carotid artery. Internal carotid arteries deviate medially.  Heterogeneous thyroid gland without dominant mass.  Lung apices without worrisome lesion.  No primary neck mass or adenopathy. Cervical spondylotic changes.   Review of the MIP images confirms the above findings.  IMPRESSION: Calcification with mild narrowing of the proximal vertebral artery bilaterally.  Calcified plaque carotid bifurcation with 56% diameter stenosis of the proximal right internal carotid artery and 58% diameter stenosis of the proximal left internal carotid artery.  CTA HEAD  Findings:  No intracranial hemorrhage.  Small vessel disease type changes without CT evidence of large acute infarct.  No intracranial mass or abnormal enhancement.  Cavernous segment internal carotid artery calcification with mild to  moderate narrowing bilaterally.  Mild irregularity M1 segment middle cerebral artery bilaterally without high-grade stenosis.  Fetal type origin right posterior  cerebral artery.  Calcified distal vertebral arteries with mild to moderate narrowing.  Mild irregularity of the basilar artery without high-grade stenosis.  Moderate narrowing P2 segment of the right posterior cerebral artery.  Mild narrowing P2 segment left posterior cerebral artery.  No aneurysm noted.   Review of the MIP images confirms the above findings.  IMPRESSION: Cavernous segment internal carotid artery calcification with mild to moderate narrowing bilaterally.  Mild irregularity M1 segment middle cerebral artery bilaterally without high-grade stenosis.  Calcified distal vertebral arteries with mild to moderate narrowing.  Mild irregularity of the basilar artery without high-grade stenosis.  Moderate narrowing P2 segment of the right posterior cerebral artery.  Mild narrowing P2 segment left posterior cerebral artery.   Original Report Authenticated By: Lacy Duverney, M.D.   Ct Head Wo Contrast  09/01/2012   *RADIOLOGY REPORT*  Clinical Data: Unwitnessed fall 11:30 p.m.  CT HEAD WITHOUT CONTRAST  Technique:  Contiguous axial images were obtained from the base of the skull through the vertex without contrast.  Comparison: 08/31/2012  Findings: The diffuse cerebral atrophy.  The ventricular dilatation consistent with central atrophy.  Low attenuation changes in the deep white matter consistent with small vessel ischemia.  No mass effect or midline shift.  No abnormal extra-axial fluid collections.  Gray-white matter junctions are distinct.  Basal cisterns are not effaced.  No evidence of acute intracranial hemorrhage.  No depressed skull fractures.  Visualized paranasal sinuses and mastoid air cells demonstrate mild mucosal thickening without acute air-fluid level.  Vascular calcifications.  No significant change since previous study.  IMPRESSION: No acute intracranial abnormality.  Chronic atrophy and small vessel ischemic changes.   Original Report Authenticated By: Burman Nieves, M.D.   Ct Angio  Neck Jensen/cm &/or Wo/cm  08/31/2012   *RADIOLOGY REPORT*  Clinical Data:  Right-sided facial droop and slurred speech lasting for 5 minutes.  Hypertension.  Hypercholesterolemia.  CT ANGIOGRAPHY HEAD AND NECK  Technique:  Multidetector CT imaging of the head and neck was performed using the standard protocol during bolus administration of intravenous contrast.  Multiplanar CT image reconstructions including MIPs were obtained to evaluate the vascular anatomy. Carotid stenosis measurements (when applicable) are obtained utilizing NASCET criteria, using the distal internal carotid diameter as the denominator.  Contrast: 80mL OMNIPAQUE IOHEXOL 350 MG/ML SOLN  Comparison:  08/29/2012 MR brain.  CTA NECK  Findings:  Calcified plaque aortic arch.  Normal configuration of the origin of the great vessels.  Artifact extends through the right subclavian artery. Calcification with prominent folds proximal right subclavian artery with mild narrowing.  Calcification with mild to moderate narrowing proximal left subclavian artery.  Calcification with mild narrowing of the proximal vertebral artery bilaterally.  Calcified plaque carotid bifurcation with 56% diameter stenosis of the proximal right internal carotid artery and 58% diameter stenosis of the proximal left internal carotid artery. Internal carotid arteries deviate medially.  Heterogeneous thyroid gland without dominant mass.  Lung apices without worrisome lesion.  No primary neck mass or adenopathy. Cervical spondylotic changes.   Review of the MIP images confirms the above findings.  IMPRESSION: Calcification with mild narrowing of the proximal vertebral artery bilaterally.  Calcified plaque carotid bifurcation with 56% diameter stenosis of the proximal right internal carotid artery and 58% diameter stenosis of the proximal left internal carotid artery.  CTA HEAD  Findings:  No intracranial hemorrhage.  Small  vessel disease type changes without CT evidence of large acute  infarct.  No intracranial mass or abnormal enhancement.  Cavernous segment internal carotid artery calcification with mild to moderate narrowing bilaterally.  Mild irregularity M1 segment middle cerebral artery bilaterally without high-grade stenosis.  Fetal type origin right posterior cerebral artery.  Calcified distal vertebral arteries with mild to moderate narrowing.  Mild irregularity of the basilar artery without high-grade stenosis.  Moderate narrowing P2 segment of the right posterior cerebral artery.  Mild narrowing P2 segment left posterior cerebral artery.  No aneurysm noted.   Review of the MIP images confirms the above findings.  IMPRESSION: Cavernous segment internal carotid artery calcification with mild to moderate narrowing bilaterally.  Mild irregularity M1 segment middle cerebral artery bilaterally without high-grade stenosis.  Calcified distal vertebral arteries with mild to moderate narrowing.  Mild irregularity of the basilar artery without high-grade stenosis.  Moderate narrowing P2 segment of the right posterior cerebral artery.  Mild narrowing P2 segment left posterior cerebral artery.   Original Report Authenticated By: Lacy Duverney, M.D.    Medications:  I have reviewed the patient's current medications. Scheduled: . amLODipine  5 mg Oral Daily  . aspirin  325 mg Oral Daily  . calcium-vitamin D  1 tablet Oral Daily  . enoxaparin (LOVENOX) injection  40 mg Subcutaneous Q24H  . ferrous fumarate  1 tablet Oral Daily  . fluticasone  1 spray Each Nare Daily  . losartan  100 mg Oral BID  . metoprolol tartrate  12.5 mg Oral BID  . multivitamin with minerals  1 tablet Oral Daily  . pantoprazole  40 mg Oral Daily  . polymixin-bacitracin   Topical BID  . sodium chloride  3 mL Intravenous Q12H  . sodium chloride  3 mL Intravenous Q12H   Continuous: . sodium chloride 75 mL/hr at 08/30/12 1118   ZOX:WRUEAV chloride, acetaminophen, acetaminophen, bisacodyl, sodium  chloride  Assessment/Plan: 1) AMS-Showing a waxing and waning pattern with worsening overnight.  EEG and CT-A nondiagnostic for cause.  Her BP has been better controlled.  Her biggest issue with Norvasc in the past was leg swelling, but given that she is having confusion, I'd prefer to d/c the clonidine and restart the Norvasc as well as increase her Cozaar to 100mg  to keep her BP normalized.  This current issue is clearly not HTN related based on yesterday's BP readings. 2) HTN, malignant- Likely anxiety related as well.Cozaar, beta blocker,Norvasc started, and BP much better,  3) Anxiety- As above,No further sedation. Will also d/c her Prozac  4) MRSA cellulitis- Treat topically instead of orals. 5) Anemia of chronic disease- Continue daily Iron  6) Headache- No complaint this AM, thought to be due to HTN urgency. Has steroid nasal spray as well.  7) Hyponatremia- Normalized.  Worked with PT/OT yesterday but clearly has worsened in the next day.  Perhaps with all of the testing, Neuro can add some insight to this.   LOS: 4 days   Mallory Jensen 09/01/2012, 7:56 AM

## 2012-09-01 NOTE — Progress Notes (Signed)
Speech Language Pathology Dysphagia Treatment Patient Details Name: FERRIN LIEBIG MRN: [id number] DOB: 05-16-1922 Today's Date: 09/01/2012 Time: 6213-0865 SLP Time Calculation (min): 13 min  Assessment / Plan / Recommendation Clinical Impression  Pt demonstrates adequate tolerance of regular textures and thin liquids with min assist for feeding due to confusion. Family reports poor intake due to dislike of foods. Recommend upgrade to Regular texture with thin liquids to liberalize diet and allow more PO choices.  Given adequate function, no SLP f/u warranted, will sign off.      Diet Recommendation  Initiate / Change Diet: Regular;Thin liquid    SLP Plan All goals met   Pertinent Vitals/Pain NA   Swallowing Goals  SLP Swallowing Goals Patient will utilize recommended strategies during swallow to increase swallowing safety with: Supervision/safety Swallow Study Goal #2 - Progress: Met  General Temperature Spikes Noted: No Respiratory Status: Room air Behavior/Cognition: Cooperative;Pleasant mood;Requires cueing Oral Cavity - Dentition: Adequate natural dentition Patient Positioning: Upright in bed  Oral Cavity - Oral Hygiene Brush patient's teeth BID with toothbrush (using toothpaste with fluoride): Yes   Dysphagia Treatment Treatment focused on: Upgraded PO texture trials;Skilled observation of diet tolerance;Patient/family/caregiver education Family/Caregiver Educated: daughter Treatment Methods/Modalities: Skilled observation;Differential diagnosis Patient observed directly with PO's: Yes Type of PO's observed: Regular;Thin liquids Feeding: Able to feed self;Needs assist Liquids provided via: Cup;Straw Type of cueing: Verbal Amount of cueing: Minimal   GO    Harlon Ditty, MA CCC-SLP [phone]  Claudine Mouton 09/01/2012, 4:25 PM

## 2012-09-01 NOTE — Significant Event (Signed)
Pt fell around 2330. Bed alarm beeped, went to the room and seen pt on floor. Pt was alert and oriented to self only, very confuse. In no acute distress.Pt denies any pain or discomfort. No injury noted, can move all extremities.Kept pt in bed dry and comfortable. Notified MD on call Dr. Eloise Harman. [name]) was been informed. BP-174/70 P-61 R-20 O2 sat-96%RA.  @ around 0120 am pt was nauseated and vomited  small amount of vomitus like phlegm yellowish color.Called Dr. Eloise Harman and ordered to do CT of head w/o contrast, Zofran 4mg  x1 dose, depakote 125mg  x1 sitter at bedside. Meds taken and tolerated well. Will continue to monitor

## 2012-09-01 NOTE — Clinical Social Work Placement (Addendum)
Clinical Social Work Department CLINICAL SOCIAL WORK PLACEMENT NOTE 09/03/2012  Patient:  Mallory Jensen, Mallory Jensen  Account Number:  [id number] Admit date:  08/28/2012  Clinical Social Worker:  Irving Burton [place], LCSWA  Date/time:  09/01/2012 11:21 AM  Clinical Social Work is seeking post-discharge placement for this patient at the following level of care:   SKILLED NURSING   (*CSW will update this form in Epic as items are completed)   09/01/2012  Patient/family provided with [facility] Department of Clinical Social Works list of facilities offering this level of care within the geographic area requested by the patient (or if unable, by the patients family).  09/01/2012  Patient/family informed of their freedom to choose among providers that offer the needed level of care, that participate in Medicare, Medicaid or managed care program needed by the patient, have an available bed and are willing to accept the patient.  09/01/2012  Patient/family informed of [facility] ownership interest in [facility], as well as of the fact that they are under no obligation to receive care at this facility.  PASARR submitted to EDS on 09/01/2012 PASARR number received from E[employer] on 09/01/2012  FL2 transmitted to all facilities in geographic area requested by pt/family on  09/01/2012 FL2 transmitted to all facilities within larger geographic area on   Patient informed that his/her managed care company has contracts with or will negotiate with  certain facilities, including the following:     Patient/family informed of bed offers received:  09/02/2012 Patient chooses bed at [facility], [place] Physician recommends and patient chooses bed at    [facility] [place] on  09/03/2012 Patient to be transferred to facility by [facility]  The following physician request were entered in Epic:   Additional Comments:  Darlyn Chamber, MSW, LCSWA Clinical Social Work [phone]

## 2012-09-01 NOTE — Procedures (Signed)
EEG report.  Brief clinical history:  79 years old female admitted to the hospital with acute confusion and slurred speech that lasted for about 5 minutes. No prior history of frank epileptic seizures.  Technique: this is a 17 channel routine scalp EEG performed at the bedside with bipolar and monopolar montages arranged in accordance to the international 10/20 system of electrode placement. One channel was dedicated to EKG recording.  The study was performed during wakefulness and drowsiness. Intermittent photic stimulation was utilized as activating procedure.  Description:In the wakeful state, the best background consisted of a medium amplitude, posterior dominant, well sustained, symmetric and reactive 10 Hz rhythm. Drowsiness demonstrated dropout of the alpha rhythm. Intermittent photic stimulation did induce a normal driving response.  No focal or generalized epileptiform discharges noted.  No slowing seen.  EKG showed sinus rhythm.  Impression: this is a normal awake and drowsy EEG. Please, be aware that a normal EEG does not exclude the possibility of epilepsy.  Clinical correlation is advised.  Wyatt Portela, MD

## 2012-09-01 NOTE — Progress Notes (Signed)
Stroke Team Progress Note  HISTORY  Mallory Jensen is an 79 y.o. female with a past medical history significant for HTN, hypercholesterolemia, mild aortic stenosis, MRSA, compression fracture vertebral column, admitted to the hospital on 08/28/2012  For a TIA work up as she developed R facial droop and slurred speech that lasted for approximately 5 minutes. On aspirin 81 mg daily.  She was last known well by nursing staff at 3 am 08/29/2012. At about 6 am she developed acute confusion and agitation without reported focal findings on initial neurological assessment by our rapid response nurse.  She was so agitated that she required 1 mg IV ativan in order to perform STAT CT brain.  CT brain revealed no acute intracranial abnormality.   Date last known well:  Time last known well: 3 am  tPA Given no, NIHSS 0  NIHSS: 0  SUBJECTIVE Multiple family members present today. The patient feels some better. She is more oriented today.  OBJECTIVE Most recent Vital Signs: Filed Vitals:   09/01/12 0244 09/01/12 0700 09/01/12 0950 09/01/12 1035  BP: 182/59 165/56 141/53   Pulse: 65 57 59 61  Temp: 98.2 F (36.8 C) 97.9 F (36.6 C) 97.5 F (36.4 C)   TempSrc: Oral Oral Oral   Resp: 19 18 18    Height:      Weight:      SpO2: 96% 96% 96%    CBG (last 3)  No results found for this basename: GLUCAP,  in the last 72 hours  IV Fluid Intake:   . sodium chloride 75 mL/hr at 08/30/12 1118    MEDICATIONS  . amLODipine  5 mg Oral Daily  . aspirin  325 mg Oral Daily  . calcium-vitamin D  1 tablet Oral Daily  . enoxaparin (LOVENOX) injection  40 mg Subcutaneous Q24H  . ferrous fumarate  1 tablet Oral Daily  . fluticasone  1 spray Each Nare Daily  . losartan  100 mg Oral BID  . metoprolol tartrate  12.5 mg Oral BID  . multivitamin with minerals  1 tablet Oral Daily  . pantoprazole  40 mg Oral Daily  . polymixin-bacitracin   Topical BID  . sodium chloride  3 mL Intravenous Q12H  . sodium  chloride  3 mL Intravenous Q12H   PRN:  sodium chloride, acetaminophen, acetaminophen, bisacodyl, sodium chloride  Diet:  Dysphagia 3 thin liquids Activity:  Up with assistance DVT Prophylaxis:  Lovenox 30  CLINICALLY SIGNIFICANT STUDIES Basic Metabolic Panel:   Recent Labs Lab 08/31/12 0535 09/01/12 0600  NA 133* 135  K 3.6 3.6  CL 98 102  CO2 24 25  GLUCOSE 101* 124*  BUN 27* 17  CREATININE 0.77 0.72  CALCIUM 9.1 8.7   Liver Function Tests:   Recent Labs Lab 08/28/12 1537 08/28/12 1803  AST 19 17  ALT 9 9  ALKPHOS 56 61  BILITOT 0.3 0.4  PROT 7.6 7.9  ALBUMIN 3.9 4.0   CBC:   Recent Labs Lab 08/28/12 1537 08/28/12 1803 08/29/12 0500  WBC 13.3* 14.9* 11.5*  NEUTROABS 10.0*  --   --   HGB 12.1 13.2 11.3*  HCT 35.4* 37.7 32.8*  MCV 79.4 79.5 79.2  PLT 248 270 237   Coagulation:   Recent Labs Lab 08/28/12 1537  LABPROT 12.7  INR 0.97   Cardiac Enzymes:   Recent Labs Lab 08/28/12 1613  TROPONINI <0.30   Urinalysis:   Recent Labs Lab 08/28/12 2009  COLORURINE YELLOW  LABSPEC 1.011  PHURINE 7.5  GLUCOSEU NEGATIVE  HGBUR LARGE*  BILIRUBINUR NEGATIVE  KETONESUR NEGATIVE  PROTEINUR >300*  UROBILINOGEN 0.2  NITRITE NEGATIVE  LEUKOCYTESUR NEGATIVE   Lipid Panel    Component Value Date/Time   CHOL 76 03/16/2012 0615   TRIG 118 03/16/2012 0615   HDL 6* 03/16/2012 0615   CHOLHDL 12.7 03/16/2012 0615   VLDL 24 03/16/2012 0615   LDLCALC 46 03/16/2012 0615   HgbA1C  Lab Results  Component Value Date   HGBA1C 5.6 08/28/2012    Urine Drug Screen:   No results found for this basename: labopia,  cocainscrnur,  labbenz,  amphetmu,  thcu,  labbarb    Alcohol Level: No results found for this basename: ETH,  in the last 168 hours  Ct Head Wo Contrast 08/29/2012 No change from recent prior study.  No acute findings.  08/28/2012 1.  Small vessel ischemic disease and brain atrophy. 2.  No acute findings.     MRI Brain Wo Contrast 08/29/2012 No acute  intracranial abnormality.  Mild for age nonspecific signal changes in the brain, such as due to chronic small vessel disease. Degenerative changes in the cervical spine.   MRA Brain Wo Contrast  See CTA head  CT angio head 08/31/2012 Cavernous segment internal carotid artery calcification with mild to moderate narrowing bilaterally.  Mild irregularity M1 segment middle cerebral artery bilaterally without high-grade stenosis.  Calcified distal vertebral arteries with mild to moderate narrowing.  Mild irregularity of the basilar artery without high-grade stenosis.  Moderate narrowing P2 segment of the right posterior cerebral artery. Mild narrowing P2 segment left posterior cerebral artery.   CT angiogram of the neck 08/31/2012 Calcification with mild narrowing of the proximal vertebral artery  bilaterally.  Calcified plaque carotid bifurcation with 56% diameter stenosis of  the proximal right internal carotid artery and 58% diameter  stenosis of the proximal left internal carotid artery.  EEG - This is a normal awake and drowsy EEG.   2D Echocardiogram  EF 60-65% with no source of embolus.   Carotid Doppler Unable to visualize bilateral ICAs and left vertebral arteries secondary to body habitus and agitation. Right vertebral artery flow is antegrade.  CXR    EKG  sinus rhythm rate 72 beats per minute with right bundle branch block  Therapy Recommendations OT rec SNF, PT too  Physical Exam   General - 79 year old female sleeping in a fetal position somewhat difficult to arouse. Uncooperative with attempts to examine. Skin - Warm and dry  NEUROLOGIC:  MENTAL STATUS: Responds to occasional questions. Some answers are appropriate - some are nonsensical. She followed only one command for me. She was able to tell me that she was in the hospital. Other orientation questions were not answered. CRANIAL NERVES: pupils equal and reactive to light although the patient would not open her  eyes voluntarily. MOTOR: Moves all extremities to painful stimuli. SENSORY: Unable to assess   ASSESSMENT Mallory Jensen is a 79 y.o. female presenting with right facial droop, dysarthria, agitation and confusion. TPA was not given secondary to early resolution of initial symptoms. Imaging confirmed small vessel disease but no infarct, Dx: left brain TIA. On aspirin 81 mg orally every day prior to admission. Now on Aspirin suppository 300 mg daily for secondary stroke prevention. Patient with resultant lethargy, confusion, and at times agitation - waxes and wanes. Work up completed.   Lethargy, confusion, and agitation most likely secondary to Vicodin / Ativan.  Renal insufficiency  - daily  bmets ordered  Hypertension  Hyperlipidemia - cholesterol 76 LDL 46 - not on statin therapy  TIA  Mild leukocytosis, resolving   Likely baseline dementia. Dementia labs unremarkable. Will need OP f/u  Senile tremor   Hospital day # 4  TREATMENT/PLAN  Change apirin suppository to aspirin 325 mg orally every day for secondary stroke prevention. Marland Kitchen  CTA head and CTA neck as above. No high-grade stenosis.  EEG - normal  OP dementia workup with neurology. Dr. Pearlean Brownie recommends Seroquel 12.5 mg at bedtime.  Disposition:  SNF  The stroke team will sign off at this time. Please call for questions or concerns.  Delton See PA-C [facility] Pager [phone] 09/01/2012, 10:37 AM  I have personally obtained a history, examined the patient, evaluated imaging results, and formulated the assessment and plan of care. I agree with the above.  Delia Heady, MD

## 2012-09-02 ENCOUNTER — Inpatient Hospital Stay (HOSPITAL_COMMUNITY): Payer: Medicare Other

## 2012-09-02 LAB — BASIC METABOLIC PANEL
BUN: 13 mg/dL (ref 6–23)
Calcium: 8.5 mg/dL (ref 8.4–10.5)
GFR calc Af Amer: 86 mL/min — ABNORMAL LOW (ref >90)
GFR calc non Af Amer: 74 mL/min — ABNORMAL LOW (ref >90)
Glucose, Bld: 112 mg/dL — ABNORMAL HIGH (ref 70–99)
Potassium: 3.4 mEq/L — ABNORMAL LOW (ref 3.5–5.1)
Sodium: 134 mEq/L — ABNORMAL LOW (ref 135–145)

## 2012-09-02 MED ORDER — POTASSIUM CHLORIDE CRYS ER 20 MEQ PO TBCR
20.0000 meq | EXTENDED_RELEASE_TABLET | Freq: Once | ORAL | Status: AC
  Administered 2012-09-02: 20 meq via ORAL
  Filled 2012-09-02: qty 1

## 2012-09-02 NOTE — Progress Notes (Signed)
NUTRITION FOLLOW UP  Intervention:   1. Carn[employer] Instant Breakfast BID.  2. Will provide ice cream on lunch and dinner trays per patient preference.   Nutrition Dx:   Inadequate oral intake now related to fair appetite as evidenced by ~50% meal intake.   Goal:   Patient will meet >/=90% of estimated nutrition needs, improved, not met  Monitor:   PO intake, weight, labs  Assessment:   Patient more alert and oriented at time of visit. SLP evaluated patient and liberalized diet to Regular to improve intake. She reports that she is eating much better. Her appetite is still fair.   Height: Ht Readings from Last 1 Encounters:  08/28/12 5\' 2"  (1.575 m)    Weight Status:   Wt Readings from Last 1 Encounters:  08/28/12 143 lb (64.864 kg)    Re-estimated needs:  Kcal: 1300-1500 kcal Protein: 60-70 g Fluid: >1.5 L/day  Skin: Intact  Diet Order: General   Intake/Output Summary (Last 24 hours) at 09/02/12 1246 Last data filed at 09/02/12 0800  Gross per 24 hour  Intake    360 ml  Output      0 ml  Net    360 ml    Last BM: PTA   Labs:   Recent Labs Lab 08/31/12 0535 09/01/12 0600 09/02/12 0528  NA 133* 135 134*  K 3.6 3.6 3.4*  CL 98 102 102  CO2 24 25 25   BUN 27* 17 13  CREATININE 0.77 0.72 0.72  CALCIUM 9.1 8.7 8.5  GLUCOSE 101* 124* 112*    CBG (last 3)  No results found for this basename: GLUCAP,  in the last 72 hours  Scheduled Meds: . amLODipine  5 mg Oral Daily  . aspirin  325 mg Oral Daily  . calcium-vitamin D  1 tablet Oral Daily  . enoxaparin (LOVENOX) injection  40 mg Subcutaneous Q24H  . ferrous fumarate  1 tablet Oral Daily  . fluticasone  1 spray Each Nare Daily  . losartan  100 mg Oral BID  . metoprolol tartrate  12.5 mg Oral BID  . multivitamin with minerals  1 tablet Oral Daily  . pantoprazole  40 mg Oral Daily  . polymixin-bacitracin   Topical BID    Continuous Infusions:   Linnell Fulling, MS, RD, LDN

## 2012-09-02 NOTE — Clinical Social Work Note (Addendum)
11:47 AM CSW received a call from 4N unit secretary, Clydie Braun, regarding pt's family's choice of placement at [facility]. CSW contacted pt's daughter, [name], and spoke with her via telephone. CSW informed Cheri that the message of the family choosing [facility] was received and that CSW would keep [facility] updated when the pt is medically ready to be discharged. Cheri mentioned that the family would prefer a private room for the pt. CSW noted that CSW will inform [facility] of this preference at the time of discharge, and that if no private rooms were available that [facility] would place the pt on a private room waiting list and transfer her from a shared room to a private when possible. Cheri was agreeable and understanding of this. CSW to continue to follow and assist with any discharge needs.  Darlyn Chamber, MSW, LCSWA Clinical Social Work [phone]   CSW spoke with pt's daughter, [name], and presented bed offers. CSW informed [name] that CSW will check back in with pt's daughters in the morning of 09/03/2012 to confirm pt's family's first and second choice of placement. CSW will continue to follow and assist with placement process and other discharge needs.  Darlyn Chamber, MSW, LCSWA Clinical Social Work [phone]

## 2012-09-02 NOTE — Progress Notes (Signed)
Subjective: Was placed on Seroquel without my knowledge by Neuro, who then signed off her care.  She remains confused.  They have also provided no insight on this.  I spoke with the family, who is concerned because we discussed the black box warnings regarding this medication which were not discussed by Neuro and they wanted it removed from her list.  She is oriented today but remains confused on circumstance.   Objective: Vital signs in last 24 hours: Temp:  [97.5 F (36.4 C)-98.5 F (36.9 C)] 98.4 F (36.9 C) (07/23 0625) Pulse Rate:  [53-71] 62 (07/23 0625) Resp:  [16-20] 18 (07/23 0625) BP: (111-157)/(40-64) 152/50 mmHg (07/23 0625) SpO2:  [94 %-99 %] 98 % (07/23 0625) Weight change:  Last BM Date: 08/31/12  Intake/Output from previous day:   Intake/Output this shift:   General appearance: Alert/oriented to person and place, but not citrcumstance as she thinks that she fell at home and ended up here yesterday.  Head: Normocephalic, without obvious abnormality, atraumatic  Eyes: conjunctivae/corneas clear. PERRL, EOM's intact.  Neck: no adenopathy, no carotid bruit, no JVD and thyroid not enlarged, symmetric, no tenderness/mass/nodules  Resp: clear to auscultation bilaterally  Cardio: regular rate and rhythm, S1, S2 normal, no murmur, click, rub or gallop  GI: soft, non-tender; bowel sounds normal; no masses, no organomegaly  Extremities: extremities normal, atraumatic, no cyanosis or edema  Neuro- No movement deficits. Confabulating this AM, can name me and her location.  ASking for people who are not alive.   Lab Results: No results found for this basename: WBC, HGB, HCT, PLT,  in the last 72 hours BMET  Recent Labs  09/01/12 0600 09/02/12 0528  NA 135 134*  K 3.6 3.4*  CL 102 102  CO2 25 25  GLUCOSE 124* 112*  BUN 17 13  CREATININE 0.72 0.72  CALCIUM 8.7 8.5    Studies/Results: Ct Angio Head W/cm &/or Wo Cm  08/31/2012   *RADIOLOGY REPORT*  Clinical Data:   Right-sided facial droop and slurred speech lasting for 5 minutes.  Hypertension.  Hypercholesterolemia.  CT ANGIOGRAPHY HEAD AND NECK  Technique:  Multidetector CT imaging of the head and neck was performed using the standard protocol during bolus administration of intravenous contrast.  Multiplanar CT image reconstructions including MIPs were obtained to evaluate the vascular anatomy. Carotid stenosis measurements (when applicable) are obtained utilizing NASCET criteria, using the distal internal carotid diameter as the denominator.  Contrast: 80mL OMNIPAQUE IOHEXOL 350 MG/ML SOLN  Comparison:  08/29/2012 MR brain.  CTA NECK  Findings:  Calcified plaque aortic arch.  Normal configuration of the origin of the great vessels.  Artifact extends through the right subclavian artery. Calcification with prominent folds proximal right subclavian artery with mild narrowing.  Calcification with mild to moderate narrowing proximal left subclavian artery.  Calcification with mild narrowing of the proximal vertebral artery bilaterally.  Calcified plaque carotid bifurcation with 56% diameter stenosis of the proximal right internal carotid artery and 58% diameter stenosis of the proximal left internal carotid artery. Internal carotid arteries deviate medially.  Heterogeneous thyroid gland without dominant mass.  Lung apices without worrisome lesion.  No primary neck mass or adenopathy. Cervical spondylotic changes.   Review of the MIP images confirms the above findings.  IMPRESSION: Calcification with mild narrowing of the proximal vertebral artery bilaterally.  Calcified plaque carotid bifurcation with 56% diameter stenosis of the proximal right internal carotid artery and 58% diameter stenosis of the proximal left internal carotid artery.  CTA  HEAD  Findings:  No intracranial hemorrhage.  Small vessel disease type changes without CT evidence of large acute infarct.  No intracranial mass or abnormal enhancement.  Cavernous  segment internal carotid artery calcification with mild to moderate narrowing bilaterally.  Mild irregularity M1 segment middle cerebral artery bilaterally without high-grade stenosis.  Fetal type origin right posterior cerebral artery.  Calcified distal vertebral arteries with mild to moderate narrowing.  Mild irregularity of the basilar artery without high-grade stenosis.  Moderate narrowing P2 segment of the right posterior cerebral artery.  Mild narrowing P2 segment left posterior cerebral artery.  No aneurysm noted.   Review of the MIP images confirms the above findings.  IMPRESSION: Cavernous segment internal carotid artery calcification with mild to moderate narrowing bilaterally.  Mild irregularity M1 segment middle cerebral artery bilaterally without high-grade stenosis.  Calcified distal vertebral arteries with mild to moderate narrowing.  Mild irregularity of the basilar artery without high-grade stenosis.  Moderate narrowing P2 segment of the right posterior cerebral artery.  Mild narrowing P2 segment left posterior cerebral artery.   Original Report Authenticated By: Lacy Duverney, M.D.   Ct Head Wo Contrast  09/01/2012   *RADIOLOGY REPORT*  Clinical Data: Unwitnessed fall 11:30 p.m.  CT HEAD WITHOUT CONTRAST  Technique:  Contiguous axial images were obtained from the base of the skull through the vertex without contrast.  Comparison: 08/31/2012  Findings: The diffuse cerebral atrophy.  The ventricular dilatation consistent with central atrophy.  Low attenuation changes in the deep white matter consistent with small vessel ischemia.  No mass effect or midline shift.  No abnormal extra-axial fluid collections.  Gray-white matter junctions are distinct.  Basal cisterns are not effaced.  No evidence of acute intracranial hemorrhage.  No depressed skull fractures.  Visualized paranasal sinuses and mastoid air cells demonstrate mild mucosal thickening without acute air-fluid level.  Vascular  calcifications.  No significant change since previous study.  IMPRESSION: No acute intracranial abnormality.  Chronic atrophy and small vessel ischemic changes.   Original Report Authenticated By: Burman Nieves, M.D.   Ct Angio Neck W/cm &/or Wo/cm  08/31/2012   *RADIOLOGY REPORT*  Clinical Data:  Right-sided facial droop and slurred speech lasting for 5 minutes.  Hypertension.  Hypercholesterolemia.  CT ANGIOGRAPHY HEAD AND NECK  Technique:  Multidetector CT imaging of the head and neck was performed using the standard protocol during bolus administration of intravenous contrast.  Multiplanar CT image reconstructions including MIPs were obtained to evaluate the vascular anatomy. Carotid stenosis measurements (when applicable) are obtained utilizing NASCET criteria, using the distal internal carotid diameter as the denominator.  Contrast: 80mL OMNIPAQUE IOHEXOL 350 MG/ML SOLN  Comparison:  08/29/2012 MR brain.  CTA NECK  Findings:  Calcified plaque aortic arch.  Normal configuration of the origin of the great vessels.  Artifact extends through the right subclavian artery. Calcification with prominent folds proximal right subclavian artery with mild narrowing.  Calcification with mild to moderate narrowing proximal left subclavian artery.  Calcification with mild narrowing of the proximal vertebral artery bilaterally.  Calcified plaque carotid bifurcation with 56% diameter stenosis of the proximal right internal carotid artery and 58% diameter stenosis of the proximal left internal carotid artery. Internal carotid arteries deviate medially.  Heterogeneous thyroid gland without dominant mass.  Lung apices without worrisome lesion.  No primary neck mass or adenopathy. Cervical spondylotic changes.   Review of the MIP images confirms the above findings.  IMPRESSION: Calcification with mild narrowing of the proximal vertebral artery bilaterally.  Calcified plaque carotid bifurcation with 56% diameter stenosis of the  proximal right internal carotid artery and 58% diameter stenosis of the proximal left internal carotid artery.  CTA HEAD  Findings:  No intracranial hemorrhage.  Small vessel disease type changes without CT evidence of large acute infarct.  No intracranial mass or abnormal enhancement.  Cavernous segment internal carotid artery calcification with mild to moderate narrowing bilaterally.  Mild irregularity M1 segment middle cerebral artery bilaterally without high-grade stenosis.  Fetal type origin right posterior cerebral artery.  Calcified distal vertebral arteries with mild to moderate narrowing.  Mild irregularity of the basilar artery without high-grade stenosis.  Moderate narrowing P2 segment of the right posterior cerebral artery.  Mild narrowing P2 segment left posterior cerebral artery.  No aneurysm noted.   Review of the MIP images confirms the above findings.  IMPRESSION: Cavernous segment internal carotid artery calcification with mild to moderate narrowing bilaterally.  Mild irregularity M1 segment middle cerebral artery bilaterally without high-grade stenosis.  Calcified distal vertebral arteries with mild to moderate narrowing.  Mild irregularity of the basilar artery without high-grade stenosis.  Moderate narrowing P2 segment of the right posterior cerebral artery.  Mild narrowing P2 segment left posterior cerebral artery.   Original Report Authenticated By: Lacy Duverney, M.D.    Medications:  I have reviewed the patient's current medications. Scheduled: . amLODipine  5 mg Oral Daily  . aspirin  325 mg Oral Daily  . calcium-vitamin D  1 tablet Oral Daily  . enoxaparin (LOVENOX) injection  40 mg Subcutaneous Q24H  . ferrous fumarate  1 tablet Oral Daily  . fluticasone  1 spray Each Nare Daily  . losartan  100 mg Oral BID  . metoprolol tartrate  12.5 mg Oral BID  . multivitamin with minerals  1 tablet Oral Daily  . pantoprazole  40 mg Oral Daily  . polymixin-bacitracin   Topical BID  .  sodium chloride  3 mL Intravenous Q12H  . sodium chloride  3 mL Intravenous Q12H   Continuous: . sodium chloride 75 mL/hr at 08/30/12 1118   ZOX:WRUEAV chloride, acetaminophen, acetaminophen, bisacodyl, sodium chloride  Assessment/Plan: 1) AMS-Showing a waxing and waning pattern with worsening overnight. EEG and CT-A nondiagnostic for cause. Her BP has been better controlled. Her biggest issue with Norvasc in the past was leg swelling, but given that she is having confusion, I'd prefer to d/c the clonidine and restart the Norvasc as well as increase her Cozaar to 100mg  to keep her BP normalized. This current issue is clearly not HTN related based on yesterday's BP readings. There is no further workup.  Neuro has signed off without a statement of explanation of cause.  I have d/ced her Seroquel, see HPI for discussion. 2) HTN, malignant- Likely anxiety related as well.Cozaar, beta blocker,Norvasc started, and BP much better,  3) Anxiety- As above,No further sedation. Will also d/c her Prozac  4) MRSA cellulitis- Treat topically instead of orals.  5) Anemia of chronic disease- Continue daily Iron  6) Headache- No complaint this AM, thought to be due to HTN urgency. Has steroid nasal spray as well.  7) Hyponatremia- Normalized.  Plan= Placement at SNF in the next day or end of day today if available.  She has been at [facility], where a physician from our practice can follow her.  Will plan on getting her an appointment as an outpatient with Neuro as they recommended.   LOS: 5 days   Felipa Laroche W 09/02/2012, 8:05 AM

## 2012-09-02 NOTE — Progress Notes (Signed)
Physical Therapy Treatment Patient Details Name: Mallory Jensen MRN: [id number] DOB: 13-Feb-1922 Today's Date: 09/02/2012 Time: 7846-9629 PT Time Calculation (min): 28 min  PT Assessment / Plan / Recommendation  History of Present Illness Mallory Jensen is an 79 y.o. female with a past medical history significant for HTN, hypercholesterolemia, mild aortic stenosis, MRSA, compression fracture vertebral column, admitted to the hospital on 7/18 for TIA work up as she developed R facial droop and slurred speech that lasted for approximately 5 minutes.  CT and MRI negative for acute findings.   Clinical Impression    PT Comments   Daughter present during session and stated pt fell out of her hospital bed a couple of days ago and has been c/o R hip pain ever since.  RN stated "work with pt and see how she does".  Pt required increased assist and increased time with MAX c/o R hip pain.  Pt unable to fully weight bear thru R LE and struggled to get OOB just to [facility].  Pt voided then assist to recliner TOTAL ASSIST unable to take any steps. Reported to RN.   Follow Up Recommendations  SNF     Does the patient have the potential to tolerate intense rehabilitation     Barriers to Discharge        Equipment Recommendations  None recommended by PT    Recommendations for Other Services    Frequency Min 3X/week   Progress towards PT Goals Progress towards PT goals: Progressing toward goals  Plan      Precautions / Restrictions Precautions Precautions: Fall    Pertinent Vitals/Pain C/o 9/10 R hip pain with activity    Mobility  Bed Mobility Bed Mobility: Supine to Sit Supine to Sit: 1: +1 Total assist Supine to Sit: Patient Percentage: 30% Details for Bed Mobility Assistance: Pt required increased assist due to R hip pain stated 9/10 with mvt.  Pt required increased time and HOB elevated along with use of bed pad to swival hips.  Transfers Transfers: Sit to Stand;Stand to Sit;Stand  Pivot Transfers Sit to Stand: 1: +1 Total assist;From bed;From toilet Sit to Stand: Patient Percentage: 30% Stand to Sit: 1: +1 Total assist;To chair/3-in-1;To toilet Stand to Sit: Patient Percentage: 30% Details for Transfer Assistance: 50% VC's on proper hand placement and increased time.  Assisted from bed to [facility] then to recliner all 1/4 turn to pt's L side.  Pt unable to functioanlly support self or weight shift to complete turns.   Ambulation/Gait Ambulation/Gait Assistance Details: Unable to attempt amb due to low performance with transfers.  Significant decline.      PT Goals (current goals can now be found in the care plan section)    Visit Information  Last PT Received On: 09/02/12 Assistance Needed: +2 History of Present Illness: Mallory Jensen is an 79 y.o. female with a past medical history significant for HTN, hypercholesterolemia, mild aortic stenosis, MRSA, compression fracture vertebral column, admitted to the hospital on 7/18 for TIA work up as she developed R facial droop and slurred speech that lasted for approximately 5 minutes.  CT and MRI negative for acute findings.    Subjective Data      Cognition       Balance     End of Session PT - End of Session Equipment Utilized During Treatment: Gait belt Activity Tolerance: Patient limited by pain Patient left: in chair;with call bell/phone within reach;with family/visitor present   Felecia Shelling  PTA [facility]  Acute  Rehab Pager      [phone]

## 2012-09-03 DIAGNOSIS — I674 Hypertensive encephalopathy: Secondary | ICD-10-CM | POA: Diagnosis not present

## 2012-09-03 DIAGNOSIS — G459 Transient cerebral ischemic attack, unspecified: Secondary | ICD-10-CM | POA: Diagnosis not present

## 2012-09-03 DIAGNOSIS — M1990 Unspecified osteoarthritis, unspecified site: Secondary | ICD-10-CM | POA: Diagnosis not present

## 2012-09-03 DIAGNOSIS — M25559 Pain in unspecified hip: Secondary | ICD-10-CM | POA: Diagnosis not present

## 2012-09-03 DIAGNOSIS — I6990 Unspecified sequelae of unspecified cerebrovascular disease: Secondary | ICD-10-CM | POA: Diagnosis not present

## 2012-09-03 DIAGNOSIS — I6789 Other cerebrovascular disease: Secondary | ICD-10-CM | POA: Diagnosis not present

## 2012-09-03 DIAGNOSIS — R279 Unspecified lack of coordination: Secondary | ICD-10-CM | POA: Diagnosis not present

## 2012-09-03 DIAGNOSIS — F329 Major depressive disorder, single episode, unspecified: Secondary | ICD-10-CM | POA: Diagnosis not present

## 2012-09-03 DIAGNOSIS — R5381 Other malaise: Secondary | ICD-10-CM | POA: Diagnosis not present

## 2012-09-03 DIAGNOSIS — Z5189 Encounter for other specified aftercare: Secondary | ICD-10-CM | POA: Diagnosis not present

## 2012-09-03 DIAGNOSIS — I10 Essential (primary) hypertension: Secondary | ICD-10-CM | POA: Diagnosis not present

## 2012-09-03 DIAGNOSIS — L03818 Cellulitis of other sites: Secondary | ICD-10-CM | POA: Diagnosis not present

## 2012-09-03 DIAGNOSIS — R05 Cough: Secondary | ICD-10-CM | POA: Diagnosis not present

## 2012-09-03 DIAGNOSIS — K219 Gastro-esophageal reflux disease without esophagitis: Secondary | ICD-10-CM | POA: Diagnosis not present

## 2012-09-03 DIAGNOSIS — R4182 Altered mental status, unspecified: Secondary | ICD-10-CM | POA: Diagnosis not present

## 2012-09-03 DIAGNOSIS — Z9181 History of falling: Secondary | ICD-10-CM | POA: Diagnosis not present

## 2012-09-03 DIAGNOSIS — K5900 Constipation, unspecified: Secondary | ICD-10-CM | POA: Diagnosis not present

## 2012-09-03 DIAGNOSIS — D638 Anemia in other chronic diseases classified elsewhere: Secondary | ICD-10-CM | POA: Diagnosis not present

## 2012-09-03 DIAGNOSIS — I119 Hypertensive heart disease without heart failure: Secondary | ICD-10-CM | POA: Diagnosis not present

## 2012-09-03 DIAGNOSIS — I679 Cerebrovascular disease, unspecified: Secondary | ICD-10-CM | POA: Diagnosis not present

## 2012-09-03 LAB — BASIC METABOLIC PANEL
BUN: 15 mg/dL (ref 6–23)
Calcium: 8.5 mg/dL (ref 8.4–10.5)
Chloride: 102 mEq/L (ref 96–112)
Creatinine, Ser: 0.78 mg/dL (ref 0.50–1.10)
GFR calc Af Amer: 84 mL/min — ABNORMAL LOW (ref >90)
GFR calc non Af Amer: 73 mL/min — ABNORMAL LOW (ref >90)

## 2012-09-03 MED ORDER — AMLODIPINE BESYLATE 5 MG PO TABS: 5.0000 mg | ORAL_TABLET | Freq: Every day | ORAL | Status: DC

## 2012-09-03 MED ORDER — LOSARTAN POTASSIUM 100 MG PO TABS: 100.0000 mg | ORAL_TABLET | Freq: Every day | ORAL | Status: DC

## 2012-09-03 MED ORDER — ASPIRIN 325 MG PO TABS: 325.0000 mg | ORAL_TABLET | Freq: Every day | ORAL | Status: DC

## 2012-09-03 MED ORDER — DSS 100 MG PO CAPS: 100.0000 mg | ORAL_CAPSULE | Freq: Two times a day (BID) | ORAL | Status: DC

## 2012-09-03 MED ORDER — METOPROLOL TARTRATE 12.5 MG HALF TABLET: 12.5000 mg | ORAL_TABLET | Freq: Two times a day (BID) | ORAL | Status: DC

## 2012-09-03 MED ORDER — ACETAMINOPHEN 325 MG PO TABS: 650.0000 mg | ORAL_TABLET | Freq: Four times a day (QID) | ORAL | Status: DC | PRN

## 2012-09-03 MED ORDER — DOCUSATE SODIUM 100 MG PO CAPS
100.0000 mg | ORAL_CAPSULE | Freq: Two times a day (BID) | ORAL | Status: DC
  Administered 2012-09-03: 100 mg via ORAL
  Filled 2012-09-03: qty 1

## 2012-09-03 MED ORDER — DOUBLE ANTIBIOTIC 500-10000 UNIT/GM EX OINT: 1.0000 "application " | TOPICAL_OINTMENT | Freq: Two times a day (BID) | CUTANEOUS | Status: DC

## 2012-09-03 MED ORDER — ENOXAPARIN SODIUM 40 MG/0.4ML ~~LOC~~ SOLN: 40.0000 mg | SUBCUTANEOUS | Status: DC

## 2012-09-03 MED ORDER — BISACODYL 5 MG PO TBEC: 5.0000 mg | DELAYED_RELEASE_TABLET | Freq: Every day | ORAL | Status: DC | PRN

## 2012-09-03 NOTE — Discharge Summary (Signed)
DISCHARGE SUMMARY  Mallory Jensen  MR#: [id number]  DOB:01-08-1923  Date of Admission: 08/28/2012 Date of Discharge: 09/03/2012  Attending Physician:Elisia Stepp W  Patient's WUJ:WJXBJYN,WGNFAOZ W, MD  Consults:  Neurology, Stroke Team  Discharge Diagnoses: Hypertensive encephalopathy vs TIA/stroke HTN Altered mental status, resolved Hip pain s/p fall Deconditioning Poor PO intake. Constipation Cellulitis, head Depression GERD Anemia of chronic disease Cerebrovascular disease  Discharge Medications:   Medication List    STOP taking these medications       doxycycline 100 MG tablet  Commonly known as:  VIBRA-TABS     FLUoxetine 10 MG capsule  Commonly known as:  PROZAC     furosemide 20 MG tablet  Commonly known as:  LASIX     glucosamine-chondroitin 500-400 MG tablet     guaiFENesin 600 MG 12 hr tablet  Commonly known as:  MUCINEX     ibuprofen 200 MG tablet  Commonly known as:  ADVIL,MOTRIN      TAKE these medications       acetaminophen 325 MG tablet  Commonly known as:  TYLENOL  Take 2 tablets (650 mg total) by mouth every 6 (six) hours as needed.     amLODipine 5 MG tablet  Commonly known as:  NORVASC  Take 1 tablet (5 mg total) by mouth daily.     aspirin 325 MG tablet  Take 1 tablet (325 mg total) by mouth daily.     bisacodyl 5 MG EC tablet  Commonly known as:  DULCOLAX  Take 1 tablet (5 mg total) by mouth daily as needed.     calcium-vitamin D 500-200 MG-UNIT per tablet  Commonly known as:  OSCAL WITH D  Take 1 tablet by mouth daily.     DSS 100 MG Caps  Take 100 mg by mouth 2 (two) times daily.     enoxaparin 40 MG/0.4ML injection  Commonly known as:  LOVENOX  Inject 0.4 mLs (40 mg total) into the skin daily.     ferrous fumarate 325 (106 FE) MG Tabs  Commonly known as:  HEMOCYTE - 106 mg FE  Take 1 tablet by mouth daily.     losartan 100 MG tablet  Commonly known as:  COZAAR  Take 1 tablet (100 mg total) by mouth  daily.     metoprolol tartrate 12.5 mg Tabs  Commonly known as:  LOPRESSOR  Take 0.5 tablets (12.5 mg total) by mouth 2 (two) times daily.     multivitamin with minerals Tabs  Take 1 tablet by mouth daily.     pantoprazole 40 MG tablet  Commonly known as:  PROTONIX  Take 1 tablet (40 mg total) by mouth daily.     polymixin-bacitracin 500-10000 UNIT/GM Oint ointment  Apply 1 application topically 2 (two) times daily.        Hospital Procedures: Ct Angio Head W/cm &/or Wo Cm  08/31/2012   *RADIOLOGY REPORT*  Clinical Data:  Right-sided facial droop and slurred speech lasting for 5 minutes.  Hypertension.  Hypercholesterolemia.  CT ANGIOGRAPHY HEAD AND NECK  Technique:  Multidetector CT imaging of the head and neck was performed using the standard protocol during bolus administration of intravenous contrast.  Multiplanar CT image reconstructions including MIPs were obtained to evaluate the vascular anatomy. Carotid stenosis measurements (when applicable) are obtained utilizing NASCET criteria, using the distal internal carotid diameter as the denominator.  Contrast: 80mL OMNIPAQUE IOHEXOL 350 MG/ML SOLN  Comparison:  08/29/2012 MR brain.  CTA NECK  Findings:  Calcified plaque  aortic arch.  Normal configuration of the origin of the great vessels.  Artifact extends through the right subclavian artery. Calcification with prominent folds proximal right subclavian artery with mild narrowing.  Calcification with mild to moderate narrowing proximal left subclavian artery.  Calcification with mild narrowing of the proximal vertebral artery bilaterally.  Calcified plaque carotid bifurcation with 56% diameter stenosis of the proximal right internal carotid artery and 58% diameter stenosis of the proximal left internal carotid artery. Internal carotid arteries deviate medially.  Heterogeneous thyroid gland without dominant mass.  Lung apices without worrisome lesion.  No primary neck mass or adenopathy.  Cervical spondylotic changes.   Review of the MIP images confirms the above findings.  IMPRESSION: Calcification with mild narrowing of the proximal vertebral artery bilaterally.  Calcified plaque carotid bifurcation with 56% diameter stenosis of the proximal right internal carotid artery and 58% diameter stenosis of the proximal left internal carotid artery.  CTA HEAD  Findings:  No intracranial hemorrhage.  Small vessel disease type changes without CT evidence of large acute infarct.  No intracranial mass or abnormal enhancement.  Cavernous segment internal carotid artery calcification with mild to moderate narrowing bilaterally.  Mild irregularity M1 segment middle cerebral artery bilaterally without high-grade stenosis.  Fetal type origin right posterior cerebral artery.  Calcified distal vertebral arteries with mild to moderate narrowing.  Mild irregularity of the basilar artery without high-grade stenosis.  Moderate narrowing P2 segment of the right posterior cerebral artery.  Mild narrowing P2 segment left posterior cerebral artery.  No aneurysm noted.   Review of the MIP images confirms the above findings.  IMPRESSION: Cavernous segment internal carotid artery calcification with mild to moderate narrowing bilaterally.  Mild irregularity M1 segment middle cerebral artery bilaterally without high-grade stenosis.  Calcified distal vertebral arteries with mild to moderate narrowing.  Mild irregularity of the basilar artery without high-grade stenosis.  Moderate narrowing P2 segment of the right posterior cerebral artery.  Mild narrowing P2 segment left posterior cerebral artery.   Original Report Authenticated By: Lacy Duverney, M.D.   Dg Hip 1 View Right  09/02/2012   *RADIOLOGY REPORT*  Clinical Data: Right hip pain, prior fall  RIGHT HIP - 1 VIEW  Comparison: None  Findings: Single AP view. Osseous demineralization. Degenerative changes of the right hip joint with mild joint space narrowing. No acute  fracture, dislocation or bone destruction identified on single AP view. Right SI joint appears preserved. Visualized right pelvis appears intact.  IMPRESSION: Mild degenerative changes right hip. No definite acute bony abnormalities on single AP view.   Original Report Authenticated By: Ulyses Southward, M.D.   Ct Head Wo Contrast  09/01/2012   *RADIOLOGY REPORT*  Clinical Data: Unwitnessed fall 11:30 p.m.  CT HEAD WITHOUT CONTRAST  Technique:  Contiguous axial images were obtained from the base of the skull through the vertex without contrast.  Comparison: 08/31/2012  Findings: The diffuse cerebral atrophy.  The ventricular dilatation consistent with central atrophy.  Low attenuation changes in the deep white matter consistent with small vessel ischemia.  No mass effect or midline shift.  No abnormal extra-axial fluid collections.  Gray-white matter junctions are distinct.  Basal cisterns are not effaced.  No evidence of acute intracranial hemorrhage.  No depressed skull fractures.  Visualized paranasal sinuses and mastoid air cells demonstrate mild mucosal thickening without acute air-fluid level.  Vascular calcifications.  No significant change since previous study.  IMPRESSION: No acute intracranial abnormality.  Chronic atrophy and small vessel ischemic changes.  Original Report Authenticated By: Burman Nieves, M.D.   Ct Head Wo Contrast  08/29/2012   *RADIOLOGY REPORT*  Clinical Data: Patient admitted with stroke and, 5:50 this morning the patient noted to be agitated with change in behavior  CT HEAD WITHOUT CONTRAST  Technique:  Contiguous axial images were obtained from the base of the skull through the vertex without contrast.  Comparison: 08/28/2012  Findings: Moderate diffuse atrophy with moderate low attenuation in the deep white matter consistent with chronic involutional change. No hemorrhage, vascular territory infarct, or extra-axial fluid. No hydrocephalus or mass.  Calvarium is intact.   IMPRESSION: No change from recent prior study.  No acute findings.   Original Report Authenticated By: Esperanza Heir, M.D.   Ct Head (brain) Wo Contrast  08/28/2012   *RADIOLOGY REPORT*  Clinical Data:  headaches.  CT HEAD WITHOUT CONTRAST  Technique:  Contiguous axial images were obtained from the base of the skull through the vertex without contrast.  Comparison: 03/13/2012  Findings: There is diffuse patchy low density throughout the subcortical and periventricular white matter consistent with chronic small vessel ischemic change.  There is prominence of the sulci and ventricles consistent with brain atrophy.  There is no evidence for acute brain infarct, hemorrhage or mass.  The skull is intact.  There is mild mucosal thickening involving bilateral maxillary sinuses.  The mastoid air cells are clear.  The skull is intact.  IMPRESSION:  1.  Small vessel ischemic disease and brain atrophy. 2.  No acute findings.   Original Report Authenticated By: Signa Kell, M.D.   Ct Angio Neck W/cm &/or Wo/cm  08/31/2012   *RADIOLOGY REPORT*  Clinical Data:  Right-sided facial droop and slurred speech lasting for 5 minutes.  Hypertension.  Hypercholesterolemia.  CT ANGIOGRAPHY HEAD AND NECK  Technique:  Multidetector CT imaging of the head and neck was performed using the standard protocol during bolus administration of intravenous contrast.  Multiplanar CT image reconstructions including MIPs were obtained to evaluate the vascular anatomy. Carotid stenosis measurements (when applicable) are obtained utilizing NASCET criteria, using the distal internal carotid diameter as the denominator.  Contrast: 80mL OMNIPAQUE IOHEXOL 350 MG/ML SOLN  Comparison:  08/29/2012 MR brain.  CTA NECK  Findings:  Calcified plaque aortic arch.  Normal configuration of the origin of the great vessels.  Artifact extends through the right subclavian artery. Calcification with prominent folds proximal right subclavian artery with mild  narrowing.  Calcification with mild to moderate narrowing proximal left subclavian artery.  Calcification with mild narrowing of the proximal vertebral artery bilaterally.  Calcified plaque carotid bifurcation with 56% diameter stenosis of the proximal right internal carotid artery and 58% diameter stenosis of the proximal left internal carotid artery. Internal carotid arteries deviate medially.  Heterogeneous thyroid gland without dominant mass.  Lung apices without worrisome lesion.  No primary neck mass or adenopathy. Cervical spondylotic changes.   Review of the MIP images confirms the above findings.  IMPRESSION: Calcification with mild narrowing of the proximal vertebral artery bilaterally.  Calcified plaque carotid bifurcation with 56% diameter stenosis of the proximal right internal carotid artery and 58% diameter stenosis of the proximal left internal carotid artery.  CTA HEAD  Findings:  No intracranial hemorrhage.  Small vessel disease type changes without CT evidence of large acute infarct.  No intracranial mass or abnormal enhancement.  Cavernous segment internal carotid artery calcification with mild to moderate narrowing bilaterally.  Mild irregularity M1 segment middle cerebral artery bilaterally without high-grade stenosis.  Fetal  type origin right posterior cerebral artery.  Calcified distal vertebral arteries with mild to moderate narrowing.  Mild irregularity of the basilar artery without high-grade stenosis.  Moderate narrowing P2 segment of the right posterior cerebral artery.  Mild narrowing P2 segment left posterior cerebral artery.  No aneurysm noted.   Review of the MIP images confirms the above findings.  IMPRESSION: Cavernous segment internal carotid artery calcification with mild to moderate narrowing bilaterally.  Mild irregularity M1 segment middle cerebral artery bilaterally without high-grade stenosis.  Calcified distal vertebral arteries with mild to moderate narrowing.  Mild  irregularity of the basilar artery without high-grade stenosis.  Moderate narrowing P2 segment of the right posterior cerebral artery.  Mild narrowing P2 segment left posterior cerebral artery.   Original Report Authenticated By: Lacy Duverney, M.D.   Mr Brain Wo Contrast  08/29/2012   *RADIOLOGY REPORT*  Clinical Data: 79 year old female with altered mental status, agitated, facial droop.  Suspected stroke.  MRI HEAD WITHOUT CONTRAST  Technique:  Multiplanar, multiecho pulse sequences of the brain and surrounding structures were obtained according to standard protocol without intravenous contrast.  Comparison: Head CTs 08/29/2012 and earlier.  Findings: Wrap artifact on ADC map imaging. Study is intermittently on other sequences degraded by motion artifact despite repeated imaging attempts.  No restricted diffusion to suggest acute infarction.  Major intracranial vascular flow voids are preserved.  Cerebral volume is within normal limits for age.  No midline shift, ventriculomegaly, mass effect, evidence of mass lesion, extra-axial collection or acute intracranial hemorrhage.  Cervicomedullary junction and pituitary are within normal limits.  Negative for age visualized cervical spine; multilevel degenerative changes.  No cortical encephalomalacia.  Mild for age scattered cerebral white matter T2 and FLAIR hyperintensity.  Mild for age T2 heterogeneity in the deep gray matter nuclei.  Brainstem and cerebellum within normal limits.  Postoperative changes to the globes. Mild paranasal sinus mucosal thickening.  Trace right mastoid effusion.  Negative nasopharynx. Normal bone marrow signal. Negative scalp soft tissues.  IMPRESSION: No acute intracranial abnormality.  Mild for age nonspecific signal changes in the brain, such as due to chronic small vessel disease. Degenerative changes in the cervical spine.   Original Report Authenticated By: Erskine Speed, M.D.    History of Present Illness: Oaklynn is an 79 year  old female with question of 5 minutes of facial droop witnessed by daughter day of admission and hypertensive urgency and headache.  Hospital Course: Mrs Day was admitted by myself on Friday evening.  She was found to have SBP in the 200's on arrival to the ER, but no treatment was initially instituted.  I started her on Hydralazine IV to get her BP under reasonable control with a gola SBP around 160.  She underwent CT which was nonacute and did well until the next morning.  Rapid response was asked to see her as her nurse thought that she was having a stroke.  Rapid response found her to be flailing around and confused.  Neuro consult by phone suggested an acute CT head and she was given Ativan.  CT again negative, but she was not sedated enough to undergo the already ordered MRI and this was delayed until later in the day.  She also underwent vascular evaluation with no indication of stroke and vessel dise mild to moderate and as expected for age.    She was taken off subsequent sedation as well as her Vicodin and seemed to be doing better in the following days and  had more confusion Tuesday evening.  Of note, Neurology had started her on Seroquel and subsequently signed off her case.  I noted to the family the black box warnings regarding stroke and vascular disease and as the Neurologist thought that her issues were stroke related and subsequently d/ced the medication.  She was a bit groggy on Wednesday but improved as the day went on.  On Saturday with her flailing around episode she had a fall and was moved to a bed by the nursing station with a sitter.  She had been seen by PT and had an X-ray of her hip performed not showing any fractures.  She also noted constipation on her day of discharge and was restarted on stool softeners but preferred prune juice to laxatives for now.  She is coherent but a bit lost on recent events which her daughters who are very involved have updated her on.  She is  in need of some rehabilitation physically and refeeding, so will be transferred to [facility], where she has been before.  We have had discussions about transitioning to ALF vs getting independent home health evaluations once she is discharged.  Family is still deciding on this, but Shantavia clearly wants to return to her home.  Day of Discharge Exam BP 144/54  Pulse 62  Temp(Src) 98.5 F (36.9 C) (Oral)  Resp 16  Ht 5\' 2"  (1.575 m)  Wt 64.864 kg (143 lb)  BMI 26.15 kg/m2  SpO2 97%  Physical Exam: General appearance: Alert/oriented to person, place and time, much improved and close to baseline as outpatient. Head: Normocephalic, without obvious abnormality, atraumatic  Eyes: conjunctivae/corneas clear. PERRL, EOM's intact.  Neck: no adenopathy, no carotid bruit, no JVD and thyroid not enlarged, symmetric, no tenderness/mass/nodules  Resp: clear to auscultation bilaterally  Cardio: regular rate and rhythm, S1, S2 normal, no murmur, click, rub or gallop  GI: soft, non-tender; bowel sounds normal; no masses, no organomegaly  Extremities: extremities normal, atraumatic, no cyanosis or edema  Neuro- No movement deficits.  Discharge Labs:  Recent Labs  09/02/12 0528 09/03/12 0638  NA 134* 136  K 3.4* 3.8  CL 102 102  CO2 25 26  GLUCOSE 112* 122*  BUN 13 15  CREATININE 0.72 0.78  CALCIUM 8.5 8.5    Lab Results  Component Value Date   INR 0.97 08/28/2012    Discharge instructions: Avoid all sedating medications and do not use Clonidine for lowering BP as question of reaction to med.  Disposition: To [facility] for continued care, physical rehab  Follow-up Appts: Follow-up with Dr. Wylene Simmer at [facility] 1 week after nursing home discharge.  Call for appointment. [facility] in 2 weeks for stroke follow up and dementia testing as suggested per Dr. Pearlean Brownie.  Condition on Discharge: Stable, improved  Signed: Tamu Golz W 09/03/2012, 8:06  AM

## 2012-09-03 NOTE — Progress Notes (Signed)
Report given to Ronalee Belts, RN from [employer].

## 2012-09-06 DIAGNOSIS — G459 Transient cerebral ischemic attack, unspecified: Secondary | ICD-10-CM | POA: Diagnosis not present

## 2012-09-06 DIAGNOSIS — I10 Essential (primary) hypertension: Secondary | ICD-10-CM | POA: Diagnosis not present

## 2012-09-06 DIAGNOSIS — R4182 Altered mental status, unspecified: Secondary | ICD-10-CM | POA: Diagnosis not present

## 2012-09-20 DIAGNOSIS — M1990 Unspecified osteoarthritis, unspecified site: Secondary | ICD-10-CM | POA: Diagnosis not present

## 2012-09-20 DIAGNOSIS — R279 Unspecified lack of coordination: Secondary | ICD-10-CM | POA: Diagnosis not present

## 2012-09-20 DIAGNOSIS — I10 Essential (primary) hypertension: Secondary | ICD-10-CM | POA: Diagnosis not present

## 2012-10-02 DIAGNOSIS — I10 Essential (primary) hypertension: Secondary | ICD-10-CM | POA: Diagnosis not present

## 2012-10-02 DIAGNOSIS — G459 Transient cerebral ischemic attack, unspecified: Secondary | ICD-10-CM | POA: Diagnosis not present

## 2012-10-02 DIAGNOSIS — Z6826 Body mass index (BMI) 26.0-26.9, adult: Secondary | ICD-10-CM | POA: Diagnosis not present

## 2012-10-02 DIAGNOSIS — F321 Major depressive disorder, single episode, moderate: Secondary | ICD-10-CM | POA: Diagnosis not present

## 2012-10-02 DIAGNOSIS — E781 Pure hyperglyceridemia: Secondary | ICD-10-CM | POA: Diagnosis not present

## 2012-10-02 DIAGNOSIS — M810 Age-related osteoporosis without current pathological fracture: Secondary | ICD-10-CM | POA: Diagnosis not present

## 2012-10-02 DIAGNOSIS — D638 Anemia in other chronic diseases classified elsewhere: Secondary | ICD-10-CM | POA: Diagnosis not present

## 2012-10-27 ENCOUNTER — Other Ambulatory Visit (HOSPITAL_COMMUNITY): Payer: Self-pay | Admitting: Internal Medicine

## 2012-10-27 ENCOUNTER — Encounter (HOSPITAL_COMMUNITY): Payer: Self-pay

## 2012-10-27 ENCOUNTER — Ambulatory Visit (HOSPITAL_COMMUNITY)
Admission: RE | Admit: 2012-10-27 | Discharge: 2012-10-27 | Disposition: A | Discharge status: Home / Self Care | Payer: Medicare Other | Source: Ambulatory Visit | Attending: Internal Medicine | Admitting: Internal Medicine

## 2012-10-27 DIAGNOSIS — M810 Age-related osteoporosis without current pathological fracture: Secondary | ICD-10-CM | POA: Insufficient documentation

## 2012-10-27 MED ORDER — SODIUM CHLORIDE 0.9 % IV SOLN
Freq: Once | INTRAVENOUS | Status: AC
  Administered 2012-10-27: 20 mL/h via INTRAVENOUS

## 2012-10-27 MED ORDER — ZOLEDRONIC ACID 5 MG/100ML IV SOLN
5.0000 mg | Freq: Once | INTRAVENOUS | Status: AC
  Administered 2012-10-27: 5 mg via INTRAVENOUS
  Filled 2012-10-27: qty 100

## 2012-11-03 DIAGNOSIS — N83209 Unspecified ovarian cyst, unspecified side: Secondary | ICD-10-CM | POA: Diagnosis not present

## 2012-11-03 DIAGNOSIS — R1900 Intra-abdominal and pelvic swelling, mass and lump, unspecified site: Secondary | ICD-10-CM | POA: Diagnosis not present

## 2012-11-19 DIAGNOSIS — Z23 Encounter for immunization: Secondary | ICD-10-CM | POA: Diagnosis not present

## 2012-12-17 ENCOUNTER — Other Ambulatory Visit: Payer: Self-pay

## 2012-12-23 DIAGNOSIS — H11829 Conjunctivochalasis, unspecified eye: Secondary | ICD-10-CM | POA: Diagnosis not present

## 2012-12-23 DIAGNOSIS — H35349 Macular cyst, hole, or pseudohole, unspecified eye: Secondary | ICD-10-CM | POA: Diagnosis not present

## 2012-12-24 DIAGNOSIS — H43819 Vitreous degeneration, unspecified eye: Secondary | ICD-10-CM | POA: Diagnosis not present

## 2012-12-24 DIAGNOSIS — H35349 Macular cyst, hole, or pseudohole, unspecified eye: Secondary | ICD-10-CM | POA: Diagnosis not present

## 2012-12-29 DIAGNOSIS — E781 Pure hyperglyceridemia: Secondary | ICD-10-CM | POA: Diagnosis not present

## 2012-12-29 DIAGNOSIS — I10 Essential (primary) hypertension: Secondary | ICD-10-CM | POA: Diagnosis not present

## 2012-12-29 DIAGNOSIS — M1990 Unspecified osteoarthritis, unspecified site: Secondary | ICD-10-CM | POA: Diagnosis not present

## 2012-12-29 DIAGNOSIS — E559 Vitamin D deficiency, unspecified: Secondary | ICD-10-CM | POA: Diagnosis not present

## 2012-12-29 DIAGNOSIS — G459 Transient cerebral ischemic attack, unspecified: Secondary | ICD-10-CM | POA: Diagnosis not present

## 2012-12-29 DIAGNOSIS — F321 Major depressive disorder, single episode, moderate: Secondary | ICD-10-CM | POA: Diagnosis not present

## 2012-12-29 DIAGNOSIS — I4510 Unspecified right bundle-branch block: Secondary | ICD-10-CM | POA: Diagnosis not present

## 2012-12-29 DIAGNOSIS — Z23 Encounter for immunization: Secondary | ICD-10-CM | POA: Diagnosis not present

## 2012-12-29 DIAGNOSIS — M810 Age-related osteoporosis without current pathological fracture: Secondary | ICD-10-CM | POA: Diagnosis not present

## 2012-12-29 DIAGNOSIS — D638 Anemia in other chronic diseases classified elsewhere: Secondary | ICD-10-CM | POA: Diagnosis not present

## 2013-01-12 DIAGNOSIS — I10 Essential (primary) hypertension: Secondary | ICD-10-CM | POA: Diagnosis not present

## 2013-01-12 DIAGNOSIS — H02059 Trichiasis without entropian unspecified eye, unspecified eyelid: Secondary | ICD-10-CM | POA: Diagnosis not present

## 2013-01-12 DIAGNOSIS — D638 Anemia in other chronic diseases classified elsewhere: Secondary | ICD-10-CM | POA: Diagnosis not present

## 2013-01-12 DIAGNOSIS — E559 Vitamin D deficiency, unspecified: Secondary | ICD-10-CM | POA: Diagnosis not present

## 2013-01-18 ENCOUNTER — Telehealth: Payer: Self-pay

## 2013-01-18 ENCOUNTER — Encounter: Payer: Self-pay | Admitting: Cardiology

## 2013-01-18 ENCOUNTER — Ambulatory Visit (INDEPENDENT_AMBULATORY_CARE_PROVIDER_SITE_OTHER): Payer: Medicare Other | Admitting: Cardiology

## 2013-01-18 VITALS — BP 160/66 | HR 68 | Ht 62.0 in | Wt 152.0 lb

## 2013-01-18 DIAGNOSIS — I4510 Unspecified right bundle-branch block: Secondary | ICD-10-CM

## 2013-01-18 DIAGNOSIS — E7800 Pure hypercholesterolemia, unspecified: Secondary | ICD-10-CM | POA: Diagnosis not present

## 2013-01-18 DIAGNOSIS — I10 Essential (primary) hypertension: Secondary | ICD-10-CM

## 2013-01-18 NOTE — Telephone Encounter (Signed)
Copy of 01/18/13 EKG signed by Dr.Jordan and faxed to Dr.Appenzellar at fax # [phone].

## 2013-01-18 NOTE — Patient Instructions (Signed)
Continue your current therapy  I will see you in one year   

## 2013-01-18 NOTE — Progress Notes (Signed)
[name] Date of Birth: 1922-07-31 Medical Record #[id number]  History of Present Illness: Mrs. Giarratano is seen for  Followup. Last seen in June 2013. She has a history of hypertension, hyperlipidemia, and right bundle branch block. She has done well over this past year from a cardiac standpoint. She was hospitalized last January with pancreatitis. In July 2014 she was admitted with hyperensive encephalopathy. Metoprolol was added at that time. She is living by herself now. She denies any chest pain or SOB. She has a macular hole and needs surgery.  Current Outpatient Prescriptions on File Prior to Visit  Medication Sig Dispense Refill  . acetaminophen (TYLENOL) 325 MG tablet Take 2 tablets (650 mg total) by mouth every 6 (six) hours as needed.      Marland Kitchen amLODipine (NORVASC) 5 MG tablet Take 1 tablet (5 mg total) by mouth daily.      Marland Kitchen aspirin 325 MG tablet Take 1 tablet (325 mg total) by mouth daily.      . calcium-vitamin D (OSCAL WITH D) 500-200 MG-UNIT per tablet Take 1 tablet by mouth daily.      . ferrous fumarate (HEMOCYTE - 106 MG FE) 325 (106 FE) MG TABS Take 1 tablet by mouth daily.      Marland Kitchen losartan (COZAAR) 100 MG tablet Take 1 tablet (100 mg total) by mouth daily.      . Multiple Vitamin (MULTIVITAMIN WITH MINERALS) TABS Take 1 tablet by mouth daily.      . pantoprazole (PROTONIX) 40 MG tablet Take 1 tablet (40 mg total) by mouth daily.      . polymixin-bacitracin (POLYSPORIN) 500-10000 UNIT/GM OINT ointment Apply 1 application topically 2 (two) times daily.       No current facility-administered medications on file prior to visit.    Allergies  Allergen Reactions  . Codeine Other (See Comments)    UNKNOWN  . Penicillins Other (See Comments)    rash    Past Medical History  Diagnosis Date  . HTN (hypertension)   . Hypercholesteremia   . RBBB   . Mild aortic stenosis   . Chronic fatigue   . Compression fracture of vertebral column   . Ovarian cyst   . Staph  infection   . MRSA (methicillin resistant Staphylococcus aureus)   . Acute pancreatitis January 2014  . Macular hole     Past Surgical History  Procedure Laterality Date  . Cholecystectomy    . Appendectomy    . Cesarean section    . Athroscopy right shoulder    . Biopsy left breast x 2      History  Smoking status  . Never Smoker   Smokeless tobacco  . Not on file    History  Alcohol Use No    Family History  Problem Relation Age of Onset  . Cancer    . Heart disease    . Heart disease Brother   . Heart disease Brother     Review of Systems: The review of systems is positive for back pain.  All other systems were reviewed and are negative.  Physical Exam: BP 160/66  Pulse 68  Ht 5\' 2"  (1.575 m)  Wt 152 lb (68.947 kg)  BMI 27.79 kg/m2 She is an elderly white female in no acute distress. Her HEENT exam is unremarkable. She has no JVD or bruits. Lungs are clear. Cardiac exam reveals a grade 1/6 systolic ejection murmur the right upper sternal border. There is no diastolic murmur  or S3. Abdomen is obese, soft, nontender. She is kyphotic. She has no edema. Pedal pulses are good. She is alert and oriented x3. Cranial nerves II through XII are intact.  LABORATORY DATA:  ECG demonstrates normal sinus rhythm with an old right bundle branch block. Pulse is 70 beats per minute   Echo: 08/30/12 :Study Conclusions  - Left ventricle: The cavity size was normal. Wall thickness was increased in a pattern of moderate LVH. Systolic function was normal. The estimated ejection fraction was in the range of 60% to 65%. Doppler parameters are consistent with abnormal left ventricular relaxation (grade 1 diastolic dysfunction). - Mitral valve: Mildly calcified annulus. - Left atrium: The atrium was mildly dilated. Impressions:  - No cardiac source of embolism was identified, but cannot be ruled out on the basis of this examination. Recommendations: Consider transesophageal  echocardiography   Assessment / Plan: 1. HTN- BP is mildly elevated. She reports her readings with her primary MD have been good. Continue current therapy.  2. RBBB chronic.

## 2013-01-20 ENCOUNTER — Ambulatory Visit: Payer: Self-pay | Admitting: Ophthalmology

## 2013-01-20 DIAGNOSIS — H35349 Macular cyst, hole, or pseudohole, unspecified eye: Secondary | ICD-10-CM | POA: Diagnosis not present

## 2013-01-20 DIAGNOSIS — R011 Cardiac murmur, unspecified: Secondary | ICD-10-CM | POA: Diagnosis not present

## 2013-01-20 DIAGNOSIS — Z885 Allergy status to narcotic agent status: Secondary | ICD-10-CM | POA: Diagnosis not present

## 2013-01-20 DIAGNOSIS — I10 Essential (primary) hypertension: Secondary | ICD-10-CM | POA: Diagnosis not present

## 2013-01-20 DIAGNOSIS — M129 Arthropathy, unspecified: Secondary | ICD-10-CM | POA: Diagnosis not present

## 2013-01-20 DIAGNOSIS — Z880 Allergy status to penicillin: Secondary | ICD-10-CM | POA: Diagnosis not present

## 2013-01-20 DIAGNOSIS — Z79899 Other long term (current) drug therapy: Secondary | ICD-10-CM | POA: Diagnosis not present

## 2013-01-20 DIAGNOSIS — Z85828 Personal history of other malignant neoplasm of skin: Secondary | ICD-10-CM | POA: Diagnosis not present

## 2013-01-20 DIAGNOSIS — Z7982 Long term (current) use of aspirin: Secondary | ICD-10-CM | POA: Diagnosis not present

## 2013-02-09 DIAGNOSIS — R1900 Intra-abdominal and pelvic swelling, mass and lump, unspecified site: Secondary | ICD-10-CM | POA: Diagnosis not present

## 2013-02-09 DIAGNOSIS — Z124 Encounter for screening for malignant neoplasm of cervix: Secondary | ICD-10-CM | POA: Diagnosis not present

## 2013-02-11 HISTORY — PX: PARS PLANA VITRECTOMY W/ REPAIR OF MACULAR HOLE: SHX2170

## 2013-02-18 DIAGNOSIS — H35349 Macular cyst, hole, or pseudohole, unspecified eye: Secondary | ICD-10-CM | POA: Diagnosis not present

## 2013-04-22 DIAGNOSIS — H35349 Macular cyst, hole, or pseudohole, unspecified eye: Secondary | ICD-10-CM | POA: Diagnosis not present

## 2013-05-11 DIAGNOSIS — H04229 Epiphora due to insufficient drainage, unspecified lacrimal gland: Secondary | ICD-10-CM | POA: Diagnosis not present

## 2013-05-11 DIAGNOSIS — E559 Vitamin D deficiency, unspecified: Secondary | ICD-10-CM | POA: Diagnosis not present

## 2013-05-11 DIAGNOSIS — H3530 Unspecified macular degeneration: Secondary | ICD-10-CM | POA: Diagnosis not present

## 2013-05-11 DIAGNOSIS — H04569 Stenosis of unspecified lacrimal punctum: Secondary | ICD-10-CM | POA: Diagnosis not present

## 2013-05-11 DIAGNOSIS — M1990 Unspecified osteoarthritis, unspecified site: Secondary | ICD-10-CM | POA: Diagnosis not present

## 2013-05-11 DIAGNOSIS — F321 Major depressive disorder, single episode, moderate: Secondary | ICD-10-CM | POA: Diagnosis not present

## 2013-05-11 DIAGNOSIS — G459 Transient cerebral ischemic attack, unspecified: Secondary | ICD-10-CM | POA: Diagnosis not present

## 2013-05-11 DIAGNOSIS — I10 Essential (primary) hypertension: Secondary | ICD-10-CM | POA: Diagnosis not present

## 2013-07-13 DIAGNOSIS — Z961 Presence of intraocular lens: Secondary | ICD-10-CM | POA: Diagnosis not present

## 2013-07-13 DIAGNOSIS — H35349 Macular cyst, hole, or pseudohole, unspecified eye: Secondary | ICD-10-CM | POA: Diagnosis not present

## 2013-08-02 DIAGNOSIS — R1900 Intra-abdominal and pelvic swelling, mass and lump, unspecified site: Secondary | ICD-10-CM | POA: Diagnosis not present

## 2013-08-02 DIAGNOSIS — Z1231 Encounter for screening mammogram for malignant neoplasm of breast: Secondary | ICD-10-CM | POA: Diagnosis not present

## 2013-08-02 DIAGNOSIS — N83209 Unspecified ovarian cyst, unspecified side: Secondary | ICD-10-CM | POA: Diagnosis not present

## 2013-09-07 DIAGNOSIS — M810 Age-related osteoporosis without current pathological fracture: Secondary | ICD-10-CM | POA: Diagnosis not present

## 2013-09-07 DIAGNOSIS — E559 Vitamin D deficiency, unspecified: Secondary | ICD-10-CM | POA: Diagnosis not present

## 2013-09-07 DIAGNOSIS — E781 Pure hyperglyceridemia: Secondary | ICD-10-CM | POA: Diagnosis not present

## 2013-09-07 DIAGNOSIS — I10 Essential (primary) hypertension: Secondary | ICD-10-CM | POA: Diagnosis not present

## 2013-09-07 DIAGNOSIS — K219 Gastro-esophageal reflux disease without esophagitis: Secondary | ICD-10-CM | POA: Diagnosis not present

## 2013-09-07 DIAGNOSIS — H3530 Unspecified macular degeneration: Secondary | ICD-10-CM | POA: Diagnosis not present

## 2013-09-07 DIAGNOSIS — M1990 Unspecified osteoarthritis, unspecified site: Secondary | ICD-10-CM | POA: Diagnosis not present

## 2013-09-07 DIAGNOSIS — G459 Transient cerebral ischemic attack, unspecified: Secondary | ICD-10-CM | POA: Diagnosis not present

## 2013-09-21 DIAGNOSIS — M810 Age-related osteoporosis without current pathological fracture: Secondary | ICD-10-CM | POA: Diagnosis not present

## 2013-10-14 DIAGNOSIS — Z79899 Other long term (current) drug therapy: Secondary | ICD-10-CM | POA: Diagnosis not present

## 2013-10-14 DIAGNOSIS — M810 Age-related osteoporosis without current pathological fracture: Secondary | ICD-10-CM | POA: Diagnosis not present

## 2013-10-18 IMAGING — US US ABDOMEN COMPLETE
1 series · 13 of 25 positions shown · non-contrast
Comparison: CT abdomen pelvis - 03/13/2012

CLINICAL DATA: Pancreatitis, history of cholecystectomy and
appendectomy

COMPLETE ABDOMINAL ULTRASOUND

[Series 1: us abdomen complete · 0.31mm/px · 13 of 61 slices shown]
[im 1/61]
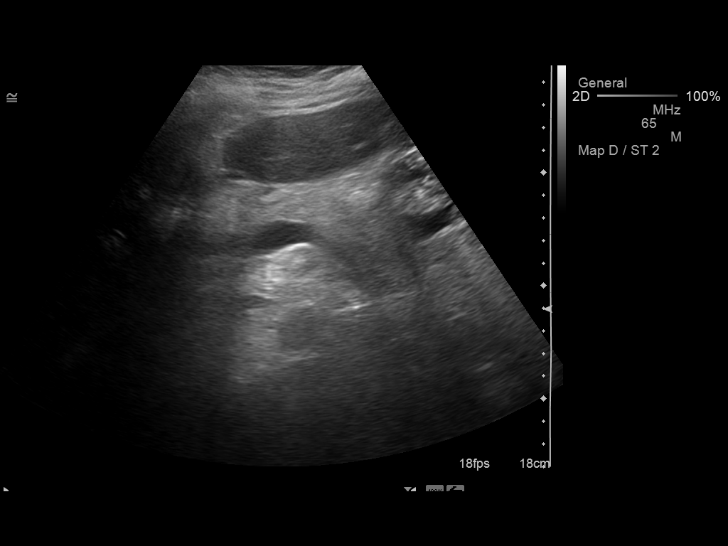
[im 6/61]
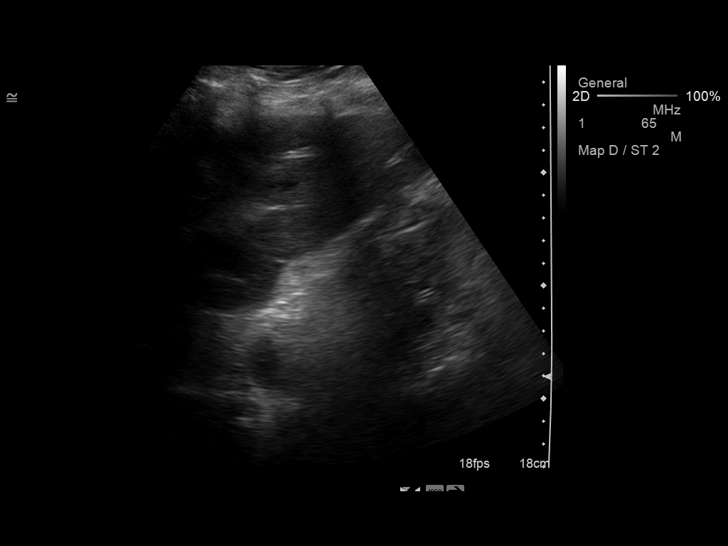
[im 11/61]
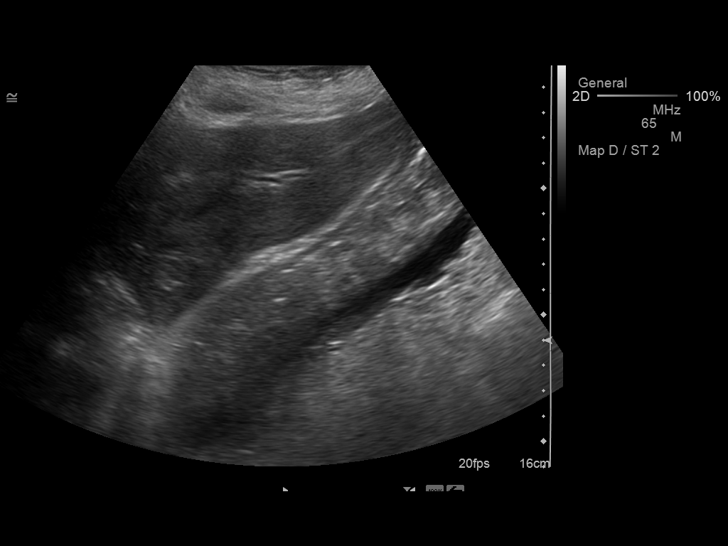
[im 16/61]
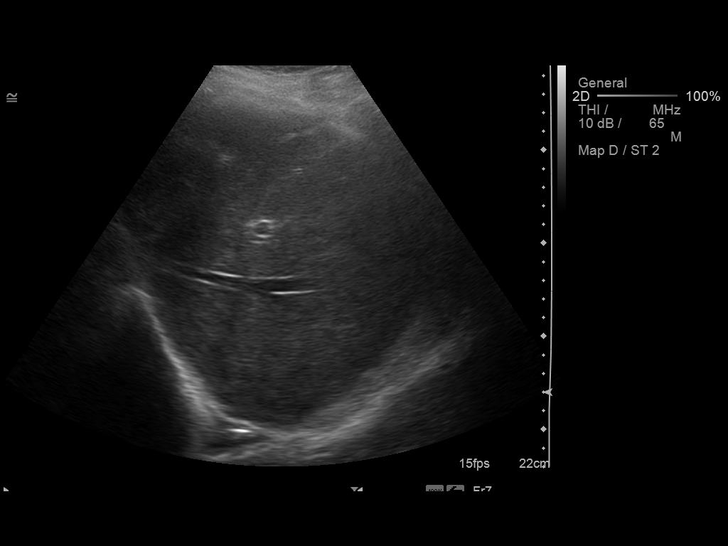
[im 21/61]
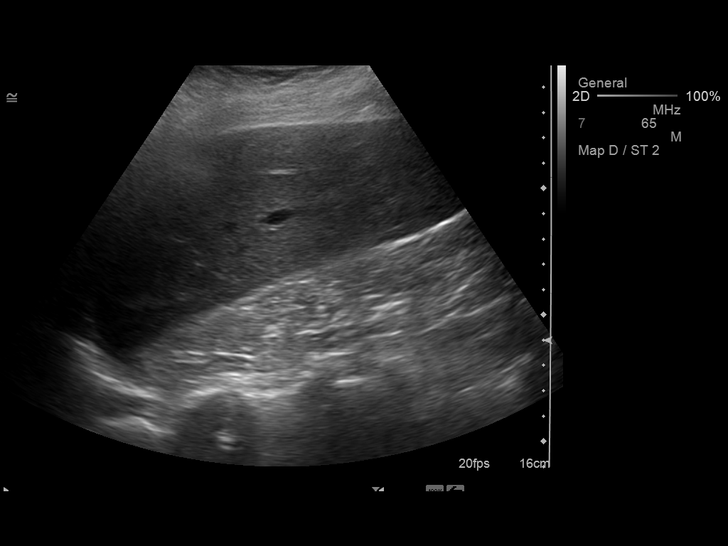
[im 26/61]
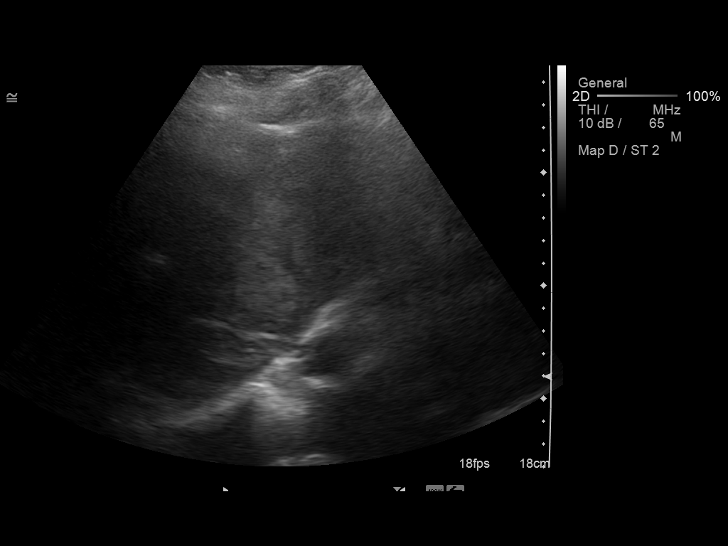
[im 31/61]
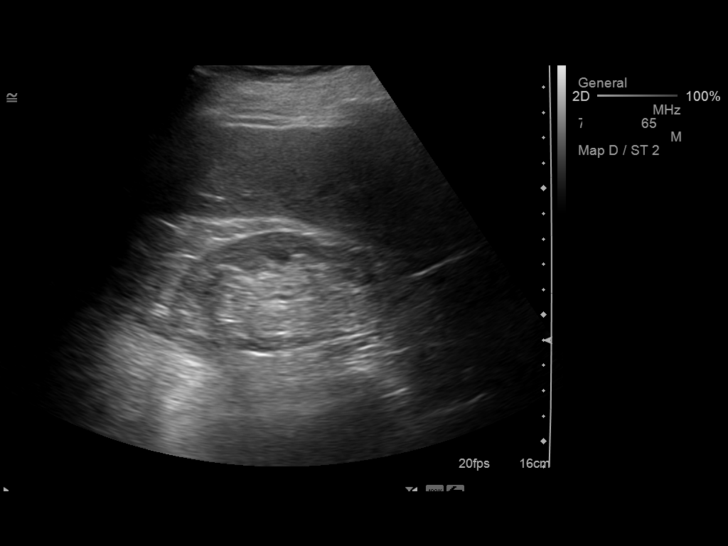
[im 36/61]
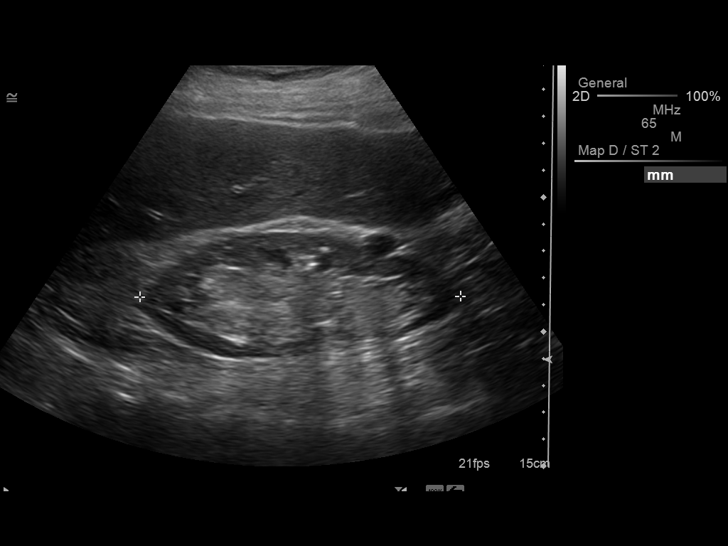
[im 41/61]
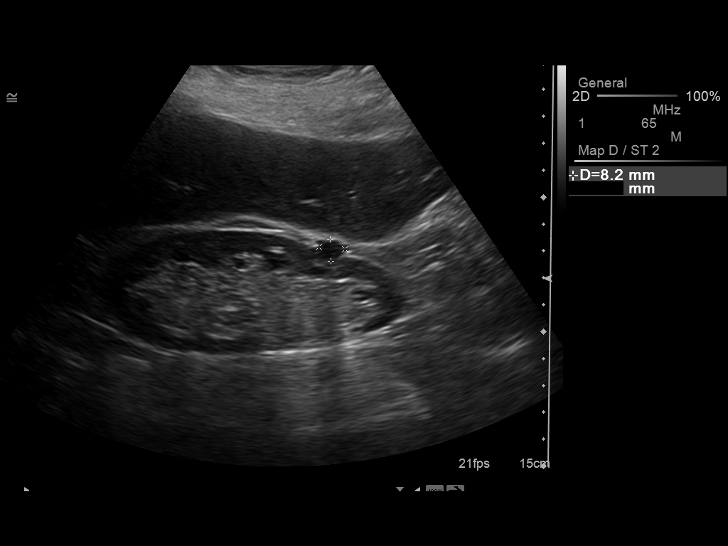
[im 46/61]
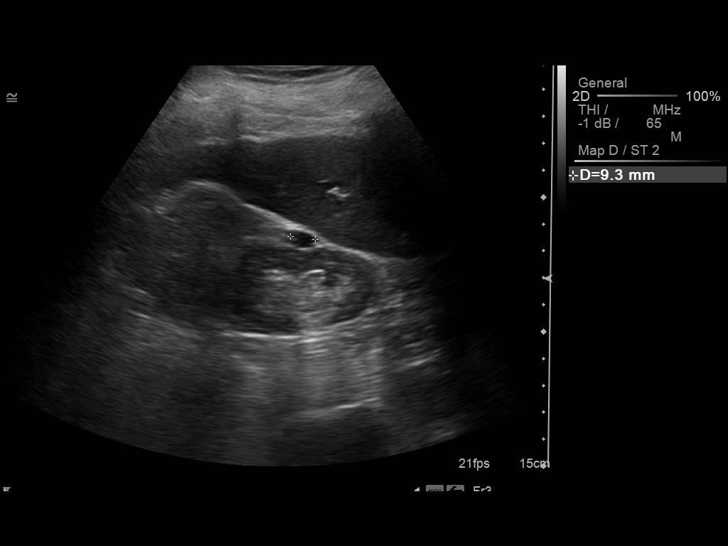
[im 51/61]
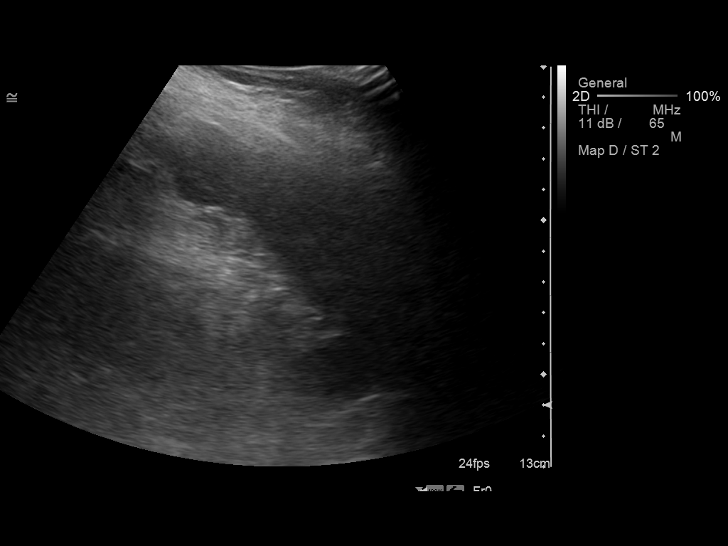
[im 56/61]
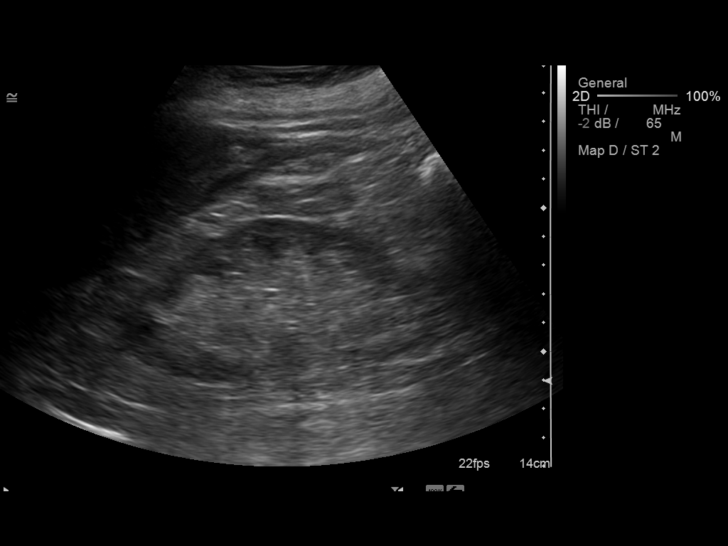
[im 61/61]
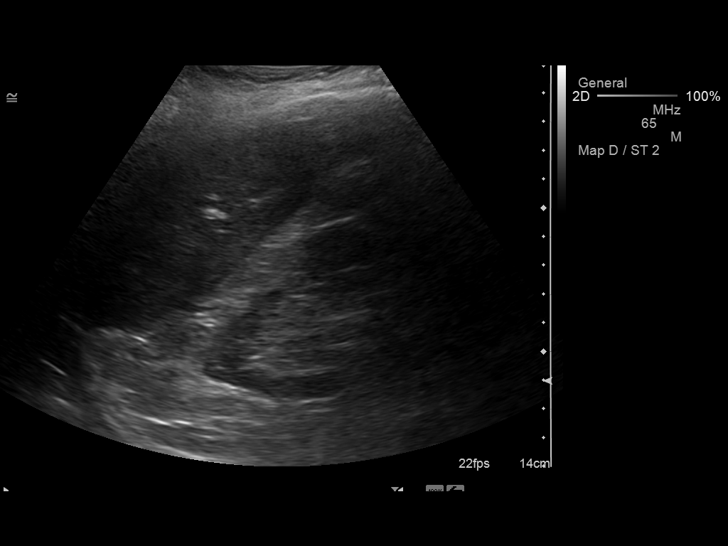

[13 of 25 positions shown; findings below may reference images not displayed]

FINDINGS: Gallbladder:  Surgically absent

Common bile duct:  Normal in size for age measuring 7.5 mm in
diameter

Liver:  Homogeneous hepatic echotexture.  No discrete hepatic
lesions.  No definite evidence of intrahepatic biliary ductal
dilatation.  No ascites.

IVC:  Appears normal.

Pancreas:  Appears diffusely thickened compatible with findings on
recent abdominal CT.  No definite pancreatic mass or peripancreatic
fluid collection.  No definite pancreatic duct dilatation.

Spleen:  Normal in size for age measuring 6.8 cm in length.

Right Kidney:  Normal cortical thickness, echogenicity and size,
measuring 11.9 cm in length.  Incidental note is made of an
approximately 1.0 x 0.8 x 0.9 cm partially exophytic anechoic cyst
arising from the inferior aspect of the right kidney.  No echogenic
renal stones.  No urinary obstruction.

Left Kidney:  Normal cortical thickness, echogenicity and size,
measuring 11.9 cm in length.  No focal renal lesions.  No echogenic
renal stones.  No urinary obstruction.

Abdominal aorta:  No aneurysm identified.
IMPRESSION: 1.  There is mild diffuse thickening of the pancreatic parenchyma
suggestive of pancreatitis as demonstrated on recent abdominal CT.
No definite pancreatic ductal dilatation.  No definite intra or
extrahepatic biliary duct dilatation.

2.  Right-sided renal cyst.

## 2013-11-05 DIAGNOSIS — D638 Anemia in other chronic diseases classified elsewhere: Secondary | ICD-10-CM | POA: Diagnosis not present

## 2013-11-05 DIAGNOSIS — Z23 Encounter for immunization: Secondary | ICD-10-CM | POA: Diagnosis not present

## 2013-11-05 DIAGNOSIS — H3530 Unspecified macular degeneration: Secondary | ICD-10-CM | POA: Diagnosis not present

## 2013-11-05 DIAGNOSIS — I10 Essential (primary) hypertension: Secondary | ICD-10-CM | POA: Diagnosis not present

## 2013-11-05 DIAGNOSIS — E781 Pure hyperglyceridemia: Secondary | ICD-10-CM | POA: Diagnosis not present

## 2013-11-05 DIAGNOSIS — S22009A Unspecified fracture of unspecified thoracic vertebra, initial encounter for closed fracture: Secondary | ICD-10-CM | POA: Diagnosis not present

## 2013-11-05 DIAGNOSIS — G459 Transient cerebral ischemic attack, unspecified: Secondary | ICD-10-CM | POA: Diagnosis not present

## 2013-11-05 DIAGNOSIS — E559 Vitamin D deficiency, unspecified: Secondary | ICD-10-CM | POA: Diagnosis not present

## 2013-11-05 DIAGNOSIS — M810 Age-related osteoporosis without current pathological fracture: Secondary | ICD-10-CM | POA: Diagnosis not present

## 2013-11-10 ENCOUNTER — Other Ambulatory Visit (HOSPITAL_COMMUNITY): Payer: Self-pay | Admitting: Internal Medicine

## 2013-11-10 ENCOUNTER — Encounter (HOSPITAL_COMMUNITY): Payer: Self-pay

## 2013-11-10 ENCOUNTER — Ambulatory Visit (HOSPITAL_COMMUNITY)
Admission: RE | Admit: 2013-11-10 | Discharge: 2013-11-10 | Disposition: A | Discharge status: Home / Self Care | Payer: Medicare Other | Source: Ambulatory Visit | Attending: Internal Medicine | Admitting: Internal Medicine

## 2013-11-10 DIAGNOSIS — M810 Age-related osteoporosis without current pathological fracture: Secondary | ICD-10-CM | POA: Insufficient documentation

## 2013-11-10 MED ORDER — ZOLEDRONIC ACID 5 MG/100ML IV SOLN
5.0000 mg | Freq: Once | INTRAVENOUS | Status: AC
  Administered 2013-11-10: 5 mg via INTRAVENOUS
  Filled 2013-11-10: qty 100

## 2013-11-10 MED ORDER — SODIUM CHLORIDE 0.9 % IV SOLN
Freq: Once | INTRAVENOUS | Status: AC
  Administered 2013-11-10: 15:00:00 via INTRAVENOUS

## 2013-11-19 DIAGNOSIS — R0789 Other chest pain: Secondary | ICD-10-CM | POA: Diagnosis not present

## 2013-11-19 DIAGNOSIS — Z6829 Body mass index (BMI) 29.0-29.9, adult: Secondary | ICD-10-CM | POA: Diagnosis not present

## 2013-12-17 DIAGNOSIS — Z961 Presence of intraocular lens: Secondary | ICD-10-CM | POA: Diagnosis not present

## 2013-12-17 DIAGNOSIS — H52203 Unspecified astigmatism, bilateral: Secondary | ICD-10-CM | POA: Diagnosis not present

## 2013-12-31 DIAGNOSIS — R1900 Intra-abdominal and pelvic swelling, mass and lump, unspecified site: Secondary | ICD-10-CM | POA: Diagnosis not present

## 2013-12-31 DIAGNOSIS — N8320 Unspecified ovarian cysts: Secondary | ICD-10-CM | POA: Diagnosis not present

## 2014-01-12 DIAGNOSIS — M810 Age-related osteoporosis without current pathological fracture: Secondary | ICD-10-CM | POA: Diagnosis not present

## 2014-01-12 DIAGNOSIS — M1990 Unspecified osteoarthritis, unspecified site: Secondary | ICD-10-CM | POA: Diagnosis not present

## 2014-01-12 DIAGNOSIS — E781 Pure hyperglyceridemia: Secondary | ICD-10-CM | POA: Diagnosis not present

## 2014-01-12 DIAGNOSIS — D638 Anemia in other chronic diseases classified elsewhere: Secondary | ICD-10-CM | POA: Diagnosis not present

## 2014-01-12 DIAGNOSIS — F321 Major depressive disorder, single episode, moderate: Secondary | ICD-10-CM | POA: Diagnosis not present

## 2014-01-12 DIAGNOSIS — R0789 Other chest pain: Secondary | ICD-10-CM | POA: Diagnosis not present

## 2014-01-12 DIAGNOSIS — I10 Essential (primary) hypertension: Secondary | ICD-10-CM | POA: Diagnosis not present

## 2014-01-12 DIAGNOSIS — G459 Transient cerebral ischemic attack, unspecified: Secondary | ICD-10-CM | POA: Diagnosis not present

## 2014-01-27 DIAGNOSIS — H35341 Macular cyst, hole, or pseudohole, right eye: Secondary | ICD-10-CM | POA: Diagnosis not present

## 2014-03-23 ENCOUNTER — Ambulatory Visit (INDEPENDENT_AMBULATORY_CARE_PROVIDER_SITE_OTHER): Payer: Medicare Other | Admitting: Cardiology

## 2014-03-23 ENCOUNTER — Encounter: Payer: Self-pay | Admitting: Cardiology

## 2014-03-23 VITALS — BP 150/74 | HR 66 | Ht 60.0 in | Wt 160.5 lb

## 2014-03-23 DIAGNOSIS — E7800 Pure hypercholesterolemia, unspecified: Secondary | ICD-10-CM

## 2014-03-23 DIAGNOSIS — I4510 Unspecified right bundle-branch block: Secondary | ICD-10-CM

## 2014-03-23 DIAGNOSIS — I10 Essential (primary) hypertension: Secondary | ICD-10-CM | POA: Diagnosis not present

## 2014-03-23 NOTE — Patient Instructions (Signed)
Continue your current therapy  I will see you in one year   

## 2014-03-23 NOTE — Progress Notes (Signed)
Mallory Jensen Date of Birth: 1922/04/09 Medical Record #[id number]  History of Present Illness: Mallory Jensen is seen for follow up of HTN. She has a history of hypertension, hyperlipidemia, and right bundle branch block. She has done well over this past year from a cardiac standpoint. She denies any chest pain or SOB. Doesn't really check her BP but states her last check in the primary care office was in the 476L systolic. She is quite sedentary. She complains of sinus congestion.   Current Outpatient Prescriptions on File Prior to Visit  Medication Sig Dispense Refill  . acetaminophen (TYLENOL) 325 MG tablet Take 2 tablets (650 mg total) by mouth every 6 (six) hours as needed.    Marland Kitchen amLODipine (NORVASC) 5 MG tablet Take 1 tablet (5 mg total) by mouth daily.    Marland Kitchen aspirin 325 MG tablet Take 1 tablet (325 mg total) by mouth daily.    . calcium-vitamin D (OSCAL WITH D) 500-200 MG-UNIT per tablet Take 1 tablet by mouth daily.    . ferrous fumarate (HEMOCYTE - 106 MG FE) 325 (106 FE) MG TABS Take 1 tablet by mouth daily.    Marland Kitchen ibuprofen (ADVIL,MOTRIN) 200 MG tablet Take 600 mg by mouth 2 (two) times daily.    Marland Kitchen losartan (COZAAR) 100 MG tablet Take 1 tablet (100 mg total) by mouth daily.    . metoprolol tartrate (LOPRESSOR) 25 MG tablet Take 12.5 mg by mouth 2 (two) times daily.    . Multiple Vitamin (MULTIVITAMIN WITH MINERALS) TABS Take 1 tablet by mouth daily.    . pantoprazole (PROTONIX) 40 MG tablet Take 1 tablet (40 mg total) by mouth daily.    . polymixin-bacitracin (POLYSPORIN) 500-10000 UNIT/GM OINT ointment Apply 1 application topically 2 (two) times daily.     No current facility-administered medications on file prior to visit.    Allergies  Allergen Reactions  . Codeine Other (See Comments)    UNKNOWN  . Penicillins Other (See Comments)    rash  . Tramadol Hcl     "Made me crazy"    Past Medical History  Diagnosis Date  . HTN (hypertension)   . Hypercholesteremia    . RBBB   . Mild aortic stenosis   . Chronic fatigue   . Compression fracture of vertebral column   . Ovarian cyst   . Staph infection   . MRSA (methicillin resistant Staphylococcus aureus)   . Acute pancreatitis January 2014  . Macular hole     Past Surgical History  Procedure Laterality Date  . Cholecystectomy    . Appendectomy    . Cesarean section    . Athroscopy right shoulder    . Biopsy left breast x 2      History  Smoking status  . Never Smoker   Smokeless tobacco  . Not on file    History  Alcohol Use No    Family History  Problem Relation Age of Onset  . Cancer    . Heart disease    . Heart disease Brother   . Heart disease Brother     Review of Systems: As noted in HPI.  All other systems were reviewed and are negative.  Physical Exam: BP 150/74 mmHg  Pulse 66  Ht 5' (1.524 m)  Wt 160 lb 8 oz (72.802 kg)  BMI 31.35 kg/m2 She is an elderly white female in no acute distress. Her HEENT exam is unremarkable. She has no JVD or bruits. Lungs are clear. Cardiac  exam reveals a grade 1/6 systolic ejection murmur the right upper sternal border. There is no diastolic murmur or S3. Abdomen is obese, soft, nontender. She is kyphotic. She has no edema. Pedal pulses are good. She is alert and oriented x3. Cranial nerves II through XII are intact.  LABORATORY DATA:  ECG demonstrates normal sinus rhythm with an old right bundle branch block. Pulse is 66 beats per minute. Old inferior MI.      Assessment / Plan: 1. HTN- BP is mildly elevated. She reports her readings with her primary MD have been good. Continue current therapy. I recommend she check her BP more frequently and record. Increase aerobic activity.   2. RBBB chronic.  Follow up in one year.

## 2014-05-12 DIAGNOSIS — D638 Anemia in other chronic diseases classified elsewhere: Secondary | ICD-10-CM | POA: Diagnosis not present

## 2014-05-12 DIAGNOSIS — E781 Pure hyperglyceridemia: Secondary | ICD-10-CM | POA: Diagnosis not present

## 2014-05-12 DIAGNOSIS — I10 Essential (primary) hypertension: Secondary | ICD-10-CM | POA: Diagnosis not present

## 2014-05-12 DIAGNOSIS — F321 Major depressive disorder, single episode, moderate: Secondary | ICD-10-CM | POA: Diagnosis not present

## 2014-05-12 DIAGNOSIS — Z6829 Body mass index (BMI) 29.0-29.9, adult: Secondary | ICD-10-CM | POA: Diagnosis not present

## 2014-05-12 DIAGNOSIS — G459 Transient cerebral ischemic attack, unspecified: Secondary | ICD-10-CM | POA: Diagnosis not present

## 2014-05-12 DIAGNOSIS — M810 Age-related osteoporosis without current pathological fracture: Secondary | ICD-10-CM | POA: Diagnosis not present

## 2014-05-12 DIAGNOSIS — H3530 Unspecified macular degeneration: Secondary | ICD-10-CM | POA: Diagnosis not present

## 2014-05-12 DIAGNOSIS — E559 Vitamin D deficiency, unspecified: Secondary | ICD-10-CM | POA: Diagnosis not present

## 2014-05-12 DIAGNOSIS — M1990 Unspecified osteoarthritis, unspecified site: Secondary | ICD-10-CM | POA: Diagnosis not present

## 2014-06-03 NOTE — Op Note (Signed)
PATIENT NAME:  Mallory Jensen, Mallory Jensen MR#:  [id number] DATE OF BIRTH:  26-Jan-1923  DATE OF PROCEDURE:  01/20/2013  PROCEDURES PERFORMED:   1.  Pars plana vitrectomy of the right eye.  2.  Internal limiting membrane peel of the right eye.  3.  Gas exchange of the right eye.   PREOPERATIVE DIAGNOSIS: Full-thickness macular hole.   POSTOPERATIVE DIAGNOSIS: Full-thickness macular hole.   ESTIMATED BLOOD LOSS: Less than 1 mL.   PRIMARY SURGEON: Garlan Fair, M.D.   ANESTHESIA: Retrobulbar block of the right eye with monitored anesthesia care.   COMPLICATIONS: None.   INDICATIONS FOR PROCEDURE: The patient presented to my office with decreasing vision in the right eye. Examination revealed a full-thickness macular hole of the right eye. Risks, benefits, and alternatives of the above procedure were discussed and the patient wished to proceed.   DETAILS OF PROCEDURE: Informed consent was obtained, the patient was brought into operative suite at [facility]. The patient was placed in supine position and was given a small dose of Alfenta and a retrobulbar block was performed on the right eye by the primary surgeon without any complications. The right eye was prepped and draped in sterile manner. After lid speculum was inserted, a 25-gauge trocar was placed inferotemporally through displaced conjunctiva in an oblique fashion 3 mm beyond the limbus in the inferotemporal quadrant. The infusion cannula was turned on and inserted through the trocar and secured in position with Steri-Strips. Two more trocars were placed in a similar fashion superotemporally and superonasally. The vitreous cutter and light pipe were introduced in the eye and a core vitrectomy was performed. The vitreous face was engaged using suction and elevated off of the retina without complication. The vitreous face was removed and the peripheral vitreous was trimmed for 360 degrees. Indocyanine green was injected  onto the posterior pole and removed within 30 seconds. An internal limiting membrane peel was performed for 360 degrees around the fovea without complication for a total diameter of 2 disc diameters. Scleral depressed exam was performed for 360 degrees and no signs of any breaks, tears, or retinal detachment could be identified. An air-fluid exchange was performed and 3 minutes was allowed to elapse for dehydration of the vitreous base. The remnant fluid was then removed. A 22% SF6 was used as an air gas exchange and the trocars were removed. The wounds were noted to be airtight. Pressure in the eye was confirmed to be approximately 15 mmHg. Dexamethasone 5 mg was given into the inferior fornix and the lid speculum was removed. The eye was cleaned and TobraDex was placed in the eye. A patch and shield were placed over the eye and the patient was taken to postanesthesia care with instruction to remain face down.    ____________________________ Teresa Pelton. Starling Manns, MD mfa:aw D: 01/20/2013 11:34:35 ET T: 01/20/2013 11:54:32 ET JOB#: [id number]  cc: Teresa Pelton. Starling Manns, MD, <Dictator> Coralee Rud MD ELECTRONICALLY SIGNED 02/10/2013 7:16

## 2014-07-28 DIAGNOSIS — H35341 Macular cyst, hole, or pseudohole, right eye: Secondary | ICD-10-CM | POA: Diagnosis not present

## 2014-07-28 DIAGNOSIS — Z961 Presence of intraocular lens: Secondary | ICD-10-CM | POA: Diagnosis not present

## 2014-08-08 ENCOUNTER — Other Ambulatory Visit: Payer: Self-pay

## 2014-08-11 DIAGNOSIS — G459 Transient cerebral ischemic attack, unspecified: Secondary | ICD-10-CM | POA: Diagnosis not present

## 2014-08-11 DIAGNOSIS — E559 Vitamin D deficiency, unspecified: Secondary | ICD-10-CM | POA: Diagnosis not present

## 2014-08-11 DIAGNOSIS — M810 Age-related osteoporosis without current pathological fracture: Secondary | ICD-10-CM | POA: Diagnosis not present

## 2014-08-11 DIAGNOSIS — I10 Essential (primary) hypertension: Secondary | ICD-10-CM | POA: Diagnosis not present

## 2014-08-11 DIAGNOSIS — H3530 Unspecified macular degeneration: Secondary | ICD-10-CM | POA: Diagnosis not present

## 2014-08-11 DIAGNOSIS — Z6829 Body mass index (BMI) 29.0-29.9, adult: Secondary | ICD-10-CM | POA: Diagnosis not present

## 2014-08-11 DIAGNOSIS — I4510 Unspecified right bundle-branch block: Secondary | ICD-10-CM | POA: Diagnosis not present

## 2014-08-11 DIAGNOSIS — R600 Localized edema: Secondary | ICD-10-CM | POA: Diagnosis not present

## 2014-08-11 DIAGNOSIS — D638 Anemia in other chronic diseases classified elsewhere: Secondary | ICD-10-CM | POA: Diagnosis not present

## 2014-08-11 DIAGNOSIS — M1990 Unspecified osteoarthritis, unspecified site: Secondary | ICD-10-CM | POA: Diagnosis not present

## 2014-08-11 DIAGNOSIS — Z1389 Encounter for screening for other disorder: Secondary | ICD-10-CM | POA: Diagnosis not present

## 2014-08-11 DIAGNOSIS — F321 Major depressive disorder, single episode, moderate: Secondary | ICD-10-CM | POA: Diagnosis not present

## 2014-08-29 DIAGNOSIS — R1900 Intra-abdominal and pelvic swelling, mass and lump, unspecified site: Secondary | ICD-10-CM | POA: Diagnosis not present

## 2014-08-29 DIAGNOSIS — N8320 Unspecified ovarian cysts: Secondary | ICD-10-CM | POA: Diagnosis not present

## 2014-08-31 DIAGNOSIS — C4442 Squamous cell carcinoma of skin of scalp and neck: Secondary | ICD-10-CM | POA: Diagnosis not present

## 2014-09-26 DIAGNOSIS — D485 Neoplasm of uncertain behavior of skin: Secondary | ICD-10-CM | POA: Diagnosis not present

## 2014-09-26 DIAGNOSIS — C4442 Squamous cell carcinoma of skin of scalp and neck: Secondary | ICD-10-CM | POA: Diagnosis not present

## 2014-09-26 DIAGNOSIS — D044 Carcinoma in situ of skin of scalp and neck: Secondary | ICD-10-CM | POA: Diagnosis not present

## 2014-11-15 DIAGNOSIS — E559 Vitamin D deficiency, unspecified: Secondary | ICD-10-CM | POA: Diagnosis not present

## 2014-11-15 DIAGNOSIS — Z79899 Other long term (current) drug therapy: Secondary | ICD-10-CM | POA: Diagnosis not present

## 2014-11-15 DIAGNOSIS — Z6829 Body mass index (BMI) 29.0-29.9, adult: Secondary | ICD-10-CM | POA: Diagnosis not present

## 2014-11-15 DIAGNOSIS — M810 Age-related osteoporosis without current pathological fracture: Secondary | ICD-10-CM | POA: Diagnosis not present

## 2014-11-16 ENCOUNTER — Other Ambulatory Visit (HOSPITAL_COMMUNITY): Payer: Self-pay | Admitting: Internal Medicine

## 2014-11-16 DIAGNOSIS — M1990 Unspecified osteoarthritis, unspecified site: Secondary | ICD-10-CM | POA: Diagnosis not present

## 2014-11-16 DIAGNOSIS — G459 Transient cerebral ischemic attack, unspecified: Secondary | ICD-10-CM | POA: Diagnosis not present

## 2014-11-16 DIAGNOSIS — Z1389 Encounter for screening for other disorder: Secondary | ICD-10-CM | POA: Diagnosis not present

## 2014-11-16 DIAGNOSIS — E781 Pure hyperglyceridemia: Secondary | ICD-10-CM | POA: Diagnosis not present

## 2014-11-16 DIAGNOSIS — D638 Anemia in other chronic diseases classified elsewhere: Secondary | ICD-10-CM | POA: Diagnosis not present

## 2014-11-16 DIAGNOSIS — E559 Vitamin D deficiency, unspecified: Secondary | ICD-10-CM | POA: Diagnosis not present

## 2014-11-16 DIAGNOSIS — M810 Age-related osteoporosis without current pathological fracture: Secondary | ICD-10-CM | POA: Diagnosis not present

## 2014-11-16 DIAGNOSIS — Z6829 Body mass index (BMI) 29.0-29.9, adult: Secondary | ICD-10-CM | POA: Diagnosis not present

## 2014-11-16 DIAGNOSIS — F321 Major depressive disorder, single episode, moderate: Secondary | ICD-10-CM | POA: Diagnosis not present

## 2014-11-16 DIAGNOSIS — I4510 Unspecified right bundle-branch block: Secondary | ICD-10-CM | POA: Diagnosis not present

## 2014-11-16 DIAGNOSIS — I10 Essential (primary) hypertension: Secondary | ICD-10-CM | POA: Diagnosis not present

## 2014-11-16 DIAGNOSIS — Z23 Encounter for immunization: Secondary | ICD-10-CM | POA: Diagnosis not present

## 2014-11-18 DIAGNOSIS — Z01419 Encounter for gynecological examination (general) (routine) without abnormal findings: Secondary | ICD-10-CM | POA: Diagnosis not present

## 2014-11-18 DIAGNOSIS — Z6830 Body mass index (BMI) 30.0-30.9, adult: Secondary | ICD-10-CM | POA: Diagnosis not present

## 2014-11-18 DIAGNOSIS — Z0000 Encounter for general adult medical examination without abnormal findings: Secondary | ICD-10-CM | POA: Diagnosis not present

## 2014-11-28 DIAGNOSIS — D044 Carcinoma in situ of skin of scalp and neck: Secondary | ICD-10-CM | POA: Diagnosis not present

## 2014-12-15 ENCOUNTER — Ambulatory Visit (HOSPITAL_COMMUNITY)
Admission: RE | Admit: 2014-12-15 | Discharge: 2014-12-15 | Disposition: A | Discharge status: Home / Self Care | Payer: Medicare Other | Source: Ambulatory Visit | Attending: Internal Medicine | Admitting: Internal Medicine

## 2014-12-15 ENCOUNTER — Encounter (HOSPITAL_COMMUNITY): Payer: Self-pay

## 2014-12-15 DIAGNOSIS — M810 Age-related osteoporosis without current pathological fracture: Secondary | ICD-10-CM | POA: Diagnosis not present

## 2014-12-15 MED ORDER — ZOLEDRONIC ACID 5 MG/100ML IV SOLN
5.0000 mg | Freq: Once | INTRAVENOUS | Status: AC
  Administered 2014-12-15: 5 mg via INTRAVENOUS
  Filled 2014-12-15: qty 100

## 2014-12-15 MED ORDER — SODIUM CHLORIDE 0.9 % IV SOLN
Freq: Once | INTRAVENOUS | Status: AC
  Administered 2014-12-15: 250 mL via INTRAVENOUS

## 2014-12-15 NOTE — Discharge Instructions (Signed)
RECLAST °Zoledronic Acid injection (Paget's Disease, Osteoporosis) °What is this medicine? °ZOLEDRONIC ACID (ZOE le dron ik AS id) lowers the amount of calcium loss from bone. It is used to treat Paget's disease and osteoporosis in women. °This medicine may be used for other purposes; ask your health care provider or pharmacist if you have questions. °What should I tell my health care provider before I take this medicine? °They need to know if you have any of these conditions: °-aspirin-sensitive asthma °-cancer, especially if you are receiving medicines used to treat cancer °-dental disease or wear dentures °-infection °-kidney disease °-low levels of calcium in the blood °-past surgery on the parathyroid gland or intestines °-receiving corticosteroids like dexamethasone or prednisone °-an unusual or allergic reaction to zoledronic acid, other medicines, foods, dyes, or preservatives °-pregnant or trying to get pregnant °-breast-feeding °How should I use this medicine? °This medicine is for infusion into a vein. It is given by a health care professional in a hospital or clinic setting. °Talk to your pediatrician regarding the use of this medicine in children. This medicine is not approved for use in children. °Overdosage: If you think you have taken too much of this medicine contact a poison control center or emergency room at once. °NOTE: This medicine is only for you. Do not share this medicine with others. °What if I miss a dose? °It is important not to miss your dose. Call your doctor or health care professional if you are unable to keep an appointment. °What may interact with this medicine? °-certain antibiotics given by injection °-NSAIDs, medicines for pain and inflammation, like ibuprofen or naproxen °-some diuretics like bumetanide, furosemide °-teriparatide °This list may not describe all possible interactions. Give your health care provider a list of all the medicines, herbs, non-prescription drugs, or  dietary supplements you use. Also tell them if you smoke, drink alcohol, or use illegal drugs. Some items may interact with your medicine. °What should I watch for while using this medicine? °Visit your doctor or health care professional for regular checkups. It may be some time before you see the benefit from this medicine. Do not stop taking your medicine unless your doctor tells you to. Your doctor may order blood tests or other tests to see how you are doing. °Women should inform their doctor if they wish to become pregnant or think they might be pregnant. There is a potential for serious side effects to an unborn child. Talk to your health care professional or pharmacist for more information. °You should make sure that you get enough calcium and vitamin D while you are taking this medicine. Discuss the foods you eat and the vitamins you take with your health care professional. °Some people who take this medicine have severe bone, joint, and/or muscle pain. This medicine may also increase your risk for jaw problems or a broken thigh bone. Tell your doctor right away if you have severe pain in your jaw, bones, joints, or muscles. Tell your doctor if you have any pain that does not go away or that gets worse. °Tell your dentist and dental surgeon that you are taking this medicine. You should not have major dental surgery while on this medicine. See your dentist to have a dental exam and fix any dental problems before starting this medicine. Take good care of your teeth while on this medicine. Make sure you see your dentist for regular follow-up appointments. °What side effects may I notice from receiving this medicine? °Side effects that you   should report to your doctor or health care professional as soon as possible: °-allergic reactions like skin rash, itching or hives, swelling of the face, lips, or tongue °-anxiety, confusion, or depression °-breathing problems °-changes in vision °-eye pain °-feeling faint or  lightheaded, falls °-jaw pain, especially after dental work °-mouth sores °-muscle cramps, stiffness, or weakness °-redness, blistering, peeling or loosening of the skin, including inside the mouth °-trouble passing urine or change in the amount of urine °Side effects that usually do not require medical attention (report to your doctor or health care professional if they continue or are bothersome): °-bone, joint, or muscle pain °-constipation °-diarrhea °-fever °-hair loss °-irritation at site where injected °-loss of appetite °-nausea, vomiting °-stomach upset °-trouble sleeping °-trouble swallowing °-weak or tired °This list may not describe all possible side effects. Call your doctor for medical advice about side effects. You may report side effects to FDA at 1-800-FDA-1088. °Where should I keep my medicine? °This drug is given in a hospital or clinic and will not be stored at home. °NOTE: This sheet is a summary. It may not cover all possible information. If you have questions about this medicine, talk to your doctor, pharmacist, or health care provider. °  °© 2016, Elsevier/Gold Standard. (2013-06-26 14:19:57) °Osteoporosis °Osteoporosis is the thinning and loss of density in the bones. Osteoporosis makes the bones more brittle, fragile, and likely to break (fracture). Over time, osteoporosis can cause the bones to become so weak that they fracture after a simple fall. The bones most likely to fracture are the bones in the hip, wrist, and spine. °CAUSES  °The exact cause is not known. °RISK FACTORS °Anyone can develop osteoporosis. You may be at greater risk if you have a family history of the condition or have poor nutrition. You may also have a higher risk if you are:  °· Female.   °· 79 years old or older. °· A smoker. °· Not physically active.   °· White or Asian. °· Slender. °SIGNS AND SYMPTOMS  °A fracture might be the first sign of the disease, especially if it results from a fall or injury that would  not usually cause a bone to break. Other signs and symptoms include:  °· Low back and neck pain. °· Stooped posture. °· Height loss. °DIAGNOSIS  °To make a diagnosis, your health care provider may: °· Take a medical history. °· Perform a physical exam. °· Order tests, such as: °¨ A bone mineral density test. °¨ A dual-energy X-ray absorptiometry test. °TREATMENT  °The goal of osteoporosis treatment is to strengthen your bones to reduce your risk of a fracture. Treatment may involve: °· Making lifestyle changes, such as: °¨ Eating a diet rich in calcium. °¨ Doing weight-bearing and muscle-strengthening exercises. °¨ Stopping tobacco use. °¨ Limiting alcohol intake. °· Taking medicine to slow the process of bone loss or to increase bone density. °· Monitoring your levels of calcium and vitamin D. °HOME CARE INSTRUCTIONS °· Include calcium and vitamin D in your diet. Calcium is important for bone health, and vitamin D helps the body absorb calcium. °· Perform weight-bearing and muscle-strengthening exercises as directed by your health care provider. °· Do not use any tobacco products, including cigarettes, chewing tobacco, and electronic cigarettes. If you need help quitting, ask your health care provider. °· Limit your alcohol intake. °· Take medicines only as directed by your health care provider. °· Keep all follow-up visits as directed by your health care provider. This is important. °· Take   precautions at home to lower your risk of falling, such as: °¨ Keeping rooms well lit and clutter free. °¨ Installing safety rails on stairs. °¨ Using rubber mats in the bathroom and other areas that are often wet or slippery. °SEEK IMMEDIATE MEDICAL CARE IF:  °You fall or injure yourself.  °  °This information is not intended to replace advice given to you by your health care provider. Make sure you discuss any questions you have with your health care provider. °  °Document Released: 11/07/2004 Document Revised: 02/18/2014  Document Reviewed: 07/08/2013 °Elsevier Interactive Patient Education ©2016 Elsevier Inc. ° ° °

## 2014-12-15 NOTE — Progress Notes (Signed)
Uneventful infusion of RECLAST. BP was elevated prior to infusion but after resting in recliner and then going to BR was 149/67. At the end of the infusion BP again elevated but pt is very eager to go home due to another appointment but after going to BR and again relaxing in recliner was 168/68. Pt was discharged via w/c( due to the long walk to the main lobby for [place] parking) accompanied by staff and friend to [place] parking.

## 2015-02-02 DIAGNOSIS — Z85828 Personal history of other malignant neoplasm of skin: Secondary | ICD-10-CM | POA: Diagnosis not present

## 2015-02-02 DIAGNOSIS — D485 Neoplasm of uncertain behavior of skin: Secondary | ICD-10-CM | POA: Diagnosis not present

## 2015-02-17 DIAGNOSIS — E559 Vitamin D deficiency, unspecified: Secondary | ICD-10-CM | POA: Diagnosis not present

## 2015-02-17 DIAGNOSIS — S22009D Unspecified fracture of unspecified thoracic vertebra, subsequent encounter for fracture with routine healing: Secondary | ICD-10-CM | POA: Diagnosis not present

## 2015-02-17 DIAGNOSIS — Z6829 Body mass index (BMI) 29.0-29.9, adult: Secondary | ICD-10-CM | POA: Diagnosis not present

## 2015-02-17 DIAGNOSIS — G459 Transient cerebral ischemic attack, unspecified: Secondary | ICD-10-CM | POA: Diagnosis not present

## 2015-02-17 DIAGNOSIS — M1990 Unspecified osteoarthritis, unspecified site: Secondary | ICD-10-CM | POA: Diagnosis not present

## 2015-02-17 DIAGNOSIS — F321 Major depressive disorder, single episode, moderate: Secondary | ICD-10-CM | POA: Diagnosis not present

## 2015-02-17 DIAGNOSIS — Z1389 Encounter for screening for other disorder: Secondary | ICD-10-CM | POA: Diagnosis not present

## 2015-02-17 DIAGNOSIS — E781 Pure hyperglyceridemia: Secondary | ICD-10-CM | POA: Diagnosis not present

## 2015-02-17 DIAGNOSIS — H3530 Unspecified macular degeneration: Secondary | ICD-10-CM | POA: Diagnosis not present

## 2015-02-17 DIAGNOSIS — D638 Anemia in other chronic diseases classified elsewhere: Secondary | ICD-10-CM | POA: Diagnosis not present

## 2015-02-17 DIAGNOSIS — M810 Age-related osteoporosis without current pathological fracture: Secondary | ICD-10-CM | POA: Diagnosis not present

## 2015-02-17 DIAGNOSIS — I10 Essential (primary) hypertension: Secondary | ICD-10-CM | POA: Diagnosis not present

## 2015-03-09 DIAGNOSIS — C4441 Basal cell carcinoma of skin of scalp and neck: Secondary | ICD-10-CM | POA: Diagnosis not present

## 2015-03-09 DIAGNOSIS — L905 Scar conditions and fibrosis of skin: Secondary | ICD-10-CM | POA: Diagnosis not present

## 2015-03-13 DIAGNOSIS — H47293 Other optic atrophy, bilateral: Secondary | ICD-10-CM | POA: Diagnosis not present

## 2015-04-03 DIAGNOSIS — H47293 Other optic atrophy, bilateral: Secondary | ICD-10-CM | POA: Diagnosis not present

## 2015-05-01 DIAGNOSIS — L570 Actinic keratosis: Secondary | ICD-10-CM | POA: Diagnosis not present

## 2015-05-01 DIAGNOSIS — D044 Carcinoma in situ of skin of scalp and neck: Secondary | ICD-10-CM | POA: Diagnosis not present

## 2015-05-01 DIAGNOSIS — C44311 Basal cell carcinoma of skin of nose: Secondary | ICD-10-CM | POA: Diagnosis not present

## 2015-05-01 DIAGNOSIS — C4442 Squamous cell carcinoma of skin of scalp and neck: Secondary | ICD-10-CM | POA: Diagnosis not present

## 2015-05-03 ENCOUNTER — Ambulatory Visit: Payer: 59 | Admitting: Cardiology

## 2015-06-02 DIAGNOSIS — E559 Vitamin D deficiency, unspecified: Secondary | ICD-10-CM | POA: Diagnosis not present

## 2015-06-02 DIAGNOSIS — I10 Essential (primary) hypertension: Secondary | ICD-10-CM | POA: Diagnosis not present

## 2015-06-02 DIAGNOSIS — E781 Pure hyperglyceridemia: Secondary | ICD-10-CM | POA: Diagnosis not present

## 2015-06-02 DIAGNOSIS — Z6830 Body mass index (BMI) 30.0-30.9, adult: Secondary | ICD-10-CM | POA: Diagnosis not present

## 2015-06-02 DIAGNOSIS — G459 Transient cerebral ischemic attack, unspecified: Secondary | ICD-10-CM | POA: Diagnosis not present

## 2015-06-02 DIAGNOSIS — M810 Age-related osteoporosis without current pathological fracture: Secondary | ICD-10-CM | POA: Diagnosis not present

## 2015-06-02 DIAGNOSIS — R0789 Other chest pain: Secondary | ICD-10-CM | POA: Diagnosis not present

## 2015-06-02 DIAGNOSIS — S22009D Unspecified fracture of unspecified thoracic vertebra, subsequent encounter for fracture with routine healing: Secondary | ICD-10-CM | POA: Diagnosis not present

## 2015-06-02 DIAGNOSIS — F321 Major depressive disorder, single episode, moderate: Secondary | ICD-10-CM | POA: Diagnosis not present

## 2015-06-02 DIAGNOSIS — M1990 Unspecified osteoarthritis, unspecified site: Secondary | ICD-10-CM | POA: Diagnosis not present

## 2015-06-02 DIAGNOSIS — D638 Anemia in other chronic diseases classified elsewhere: Secondary | ICD-10-CM | POA: Diagnosis not present

## 2015-08-07 ENCOUNTER — Encounter: Payer: Self-pay | Admitting: Cardiology

## 2015-08-07 ENCOUNTER — Ambulatory Visit (INDEPENDENT_AMBULATORY_CARE_PROVIDER_SITE_OTHER): Payer: Medicare Other | Admitting: Cardiology

## 2015-08-07 VITALS — BP 140/66 | HR 66 | Wt 157.8 lb

## 2015-08-07 DIAGNOSIS — I4510 Unspecified right bundle-branch block: Secondary | ICD-10-CM | POA: Diagnosis not present

## 2015-08-07 DIAGNOSIS — I10 Essential (primary) hypertension: Secondary | ICD-10-CM

## 2015-08-07 DIAGNOSIS — E7800 Pure hypercholesterolemia, unspecified: Secondary | ICD-10-CM

## 2015-08-07 DIAGNOSIS — I350 Nonrheumatic aortic (valve) stenosis: Secondary | ICD-10-CM

## 2015-08-07 NOTE — Addendum Note (Signed)
Addended by: Kathyrn Lass on: 08/07/2015 06:20 PM   Modules accepted: Orders, SmartSet

## 2015-08-07 NOTE — Progress Notes (Signed)
[name] Date of Birth: [birth year] Medical Record [id number]  History of Present Illness: Mallory Jensen is seen for follow up of HTN. She has a history of hypertension, hyperlipidemia, and right bundle branch block.   She has done well over this past year from a cardiac standpoint. She denies any chest pain or SOB.  She is more sedentary but still lives alone and still drives. She does note that the back of her legs get "lumpy" at times. She states she doesn't walk as fast as she used to.   Current Outpatient Prescriptions on File Prior to Visit  Medication Sig Dispense Refill  . acetaminophen (TYLENOL) 325 MG tablet Take 2 tablets (650 mg total) by mouth every 6 (six) hours as needed.    Marland Kitchen amLODipine (NORVASC) 5 MG tablet Take 1 tablet (5 mg total) by mouth daily.    Marland Kitchen aspirin 325 MG tablet Take 1 tablet (325 mg total) by mouth daily.    . calcium-vitamin D (OSCAL WITH D) 500-200 MG-UNIT per tablet Take 1 tablet by mouth daily.    . ferrous fumarate (HEMOCYTE - 106 MG FE) 325 (106 FE) MG TABS Take 1 tablet by mouth daily.    Marland Kitchen ibuprofen (ADVIL,MOTRIN) 200 MG tablet Take 600 mg by mouth 2 (two) times daily.    Marland Kitchen losartan (COZAAR) 100 MG tablet Take 1 tablet (100 mg total) by mouth daily.    . metoprolol tartrate (LOPRESSOR) 25 MG tablet Take 12.5 mg by mouth 2 (two) times daily.    . Multiple Vitamin (MULTIVITAMIN WITH MINERALS) TABS Take 1 tablet by mouth daily.    . pantoprazole (PROTONIX) 40 MG tablet Take 1 tablet (40 mg total) by mouth daily.    . polymixin-bacitracin (POLYSPORIN) 500-10000 UNIT/GM OINT ointment Apply 1 application topically 2 (two) times daily.     No current facility-administered medications on file prior to visit.    Allergies  Allergen Reactions  . Codeine Other (See Comments)    UNKNOWN  . Penicillins Other (See Comments)    rash  . Tramadol Hcl     "Made me crazy"    Past Medical History  Diagnosis Date  . HTN (hypertension)   .  Hypercholesteremia   . RBBB   . Mild aortic stenosis   . Chronic fatigue   . Compression fracture of vertebral column ([place])   . Ovarian cyst   . Staph infection   . MRSA (methicillin resistant Staphylococcus aureus)   . Acute pancreatitis January 2014  . Macular hole     Past Surgical History  Procedure Laterality Date  . Cholecystectomy    . Appendectomy    . Cesarean section    . Athroscopy right shoulder    . Biopsy left breast x 2      History  Smoking status  . Never Smoker   Smokeless tobacco  . Not on file    History  Alcohol Use No    Family History  Problem Relation Age of Onset  . Cancer    . Heart disease    . Heart disease Brother   . Heart disease Brother     Review of Systems: As noted in HPI.  All other systems were reviewed and are negative.  Physical Exam: BP 140/66 mmHg  Pulse 66  Wt 157 lb 12.8 oz (71.578 kg) She is an elderly white female in no acute distress. Her HEENT exam is unremarkable. She has no JVD or bruits. Lungs are clear.  Cardiac exam reveals a grade 1/6 systolic ejection murmur the right upper sternal border. There is no diastolic murmur or S3. Abdomen is obese, soft, nontender. She is kyphotic. She has no edema. Pedal pulses are good. She is alert and oriented x3. Cranial nerves II through XII are intact.  LABORATORY DATA:  ECG demonstrates normal sinus rhythm with an old right bundle branch block. Pulse is 66 beats per minute. First degree AV block. I have personally reviewed and interpreted this study.   Assessment / Plan: 1. HTN- BP is mildly controlled. . Continue current therapy.   2. RBBB chronic.  Follow up in one year.

## 2015-08-07 NOTE — Patient Instructions (Signed)
Continue your current therapy  I will see you in one year   

## 2015-10-18 DIAGNOSIS — H35341 Macular cyst, hole, or pseudohole, right eye: Secondary | ICD-10-CM | POA: Diagnosis not present

## 2015-11-16 DIAGNOSIS — M810 Age-related osteoporosis without current pathological fracture: Secondary | ICD-10-CM | POA: Diagnosis not present

## 2015-12-05 DIAGNOSIS — Z6830 Body mass index (BMI) 30.0-30.9, adult: Secondary | ICD-10-CM | POA: Diagnosis not present

## 2015-12-05 DIAGNOSIS — R1900 Intra-abdominal and pelvic swelling, mass and lump, unspecified site: Secondary | ICD-10-CM | POA: Diagnosis not present

## 2015-12-05 DIAGNOSIS — Z124 Encounter for screening for malignant neoplasm of cervix: Secondary | ICD-10-CM | POA: Diagnosis not present

## 2015-12-05 DIAGNOSIS — Z1231 Encounter for screening mammogram for malignant neoplasm of breast: Secondary | ICD-10-CM | POA: Diagnosis not present

## 2015-12-05 DIAGNOSIS — Z1389 Encounter for screening for other disorder: Secondary | ICD-10-CM | POA: Diagnosis not present

## 2015-12-05 DIAGNOSIS — N83201 Unspecified ovarian cyst, right side: Secondary | ICD-10-CM | POA: Diagnosis not present

## 2015-12-05 DIAGNOSIS — R809 Proteinuria, unspecified: Secondary | ICD-10-CM | POA: Diagnosis not present

## 2015-12-06 DIAGNOSIS — M810 Age-related osteoporosis without current pathological fracture: Secondary | ICD-10-CM | POA: Diagnosis not present

## 2015-12-06 DIAGNOSIS — E559 Vitamin D deficiency, unspecified: Secondary | ICD-10-CM | POA: Diagnosis not present

## 2015-12-06 DIAGNOSIS — G458 Other transient cerebral ischemic attacks and related syndromes: Secondary | ICD-10-CM | POA: Diagnosis not present

## 2015-12-06 DIAGNOSIS — H3530 Unspecified macular degeneration: Secondary | ICD-10-CM | POA: Diagnosis not present

## 2015-12-06 DIAGNOSIS — D638 Anemia in other chronic diseases classified elsewhere: Secondary | ICD-10-CM | POA: Diagnosis not present

## 2015-12-06 DIAGNOSIS — I10 Essential (primary) hypertension: Secondary | ICD-10-CM | POA: Diagnosis not present

## 2015-12-06 DIAGNOSIS — Z6829 Body mass index (BMI) 29.0-29.9, adult: Secondary | ICD-10-CM | POA: Diagnosis not present

## 2015-12-06 DIAGNOSIS — Z23 Encounter for immunization: Secondary | ICD-10-CM | POA: Diagnosis not present

## 2015-12-06 DIAGNOSIS — E781 Pure hyperglyceridemia: Secondary | ICD-10-CM | POA: Diagnosis not present

## 2015-12-06 DIAGNOSIS — R600 Localized edema: Secondary | ICD-10-CM | POA: Diagnosis not present

## 2015-12-11 DIAGNOSIS — L821 Other seborrheic keratosis: Secondary | ICD-10-CM | POA: Diagnosis not present

## 2015-12-11 DIAGNOSIS — Z85828 Personal history of other malignant neoplasm of skin: Secondary | ICD-10-CM | POA: Diagnosis not present

## 2015-12-11 DIAGNOSIS — L570 Actinic keratosis: Secondary | ICD-10-CM | POA: Diagnosis not present

## 2016-02-21 DIAGNOSIS — Z6829 Body mass index (BMI) 29.0-29.9, adult: Secondary | ICD-10-CM | POA: Diagnosis not present

## 2016-02-21 DIAGNOSIS — M546 Pain in thoracic spine: Secondary | ICD-10-CM | POA: Diagnosis not present

## 2016-04-08 DIAGNOSIS — M1990 Unspecified osteoarthritis, unspecified site: Secondary | ICD-10-CM | POA: Diagnosis not present

## 2016-04-08 DIAGNOSIS — I10 Essential (primary) hypertension: Secondary | ICD-10-CM | POA: Diagnosis not present

## 2016-04-08 DIAGNOSIS — D638 Anemia in other chronic diseases classified elsewhere: Secondary | ICD-10-CM | POA: Diagnosis not present

## 2016-04-08 DIAGNOSIS — M546 Pain in thoracic spine: Secondary | ICD-10-CM | POA: Diagnosis not present

## 2016-04-08 DIAGNOSIS — Z6829 Body mass index (BMI) 29.0-29.9, adult: Secondary | ICD-10-CM | POA: Diagnosis not present

## 2016-04-08 DIAGNOSIS — E781 Pure hyperglyceridemia: Secondary | ICD-10-CM | POA: Diagnosis not present

## 2016-04-08 DIAGNOSIS — E559 Vitamin D deficiency, unspecified: Secondary | ICD-10-CM | POA: Diagnosis not present

## 2016-04-08 DIAGNOSIS — K219 Gastro-esophageal reflux disease without esophagitis: Secondary | ICD-10-CM | POA: Diagnosis not present

## 2016-04-08 DIAGNOSIS — M810 Age-related osteoporosis without current pathological fracture: Secondary | ICD-10-CM | POA: Diagnosis not present

## 2016-04-12 DIAGNOSIS — H524 Presbyopia: Secondary | ICD-10-CM | POA: Diagnosis not present

## 2016-04-12 DIAGNOSIS — H26492 Other secondary cataract, left eye: Secondary | ICD-10-CM | POA: Diagnosis not present

## 2016-04-25 ENCOUNTER — Encounter (HOSPITAL_COMMUNITY): Payer: Self-pay

## 2016-04-25 ENCOUNTER — Ambulatory Visit (HOSPITAL_COMMUNITY)
Admission: RE | Admit: 2016-04-25 | Discharge: 2016-04-25 | Disposition: A | Discharge status: Home / Self Care | Payer: Medicare Other | Source: Ambulatory Visit | Attending: Internal Medicine | Admitting: Internal Medicine

## 2016-04-25 DIAGNOSIS — M810 Age-related osteoporosis without current pathological fracture: Secondary | ICD-10-CM | POA: Insufficient documentation

## 2016-04-25 MED ORDER — ZOLEDRONIC ACID 5 MG/100ML IV SOLN
5.0000 mg | Freq: Once | INTRAVENOUS | Status: AC
  Administered 2016-04-25: 5 mg via INTRAVENOUS
  Filled 2016-04-25: qty 100

## 2016-04-25 MED ORDER — SODIUM CHLORIDE 0.9 % IV SOLN
Freq: Once | INTRAVENOUS | Status: AC
  Administered 2016-04-25: 14:00:00 via INTRAVENOUS

## 2016-04-25 NOTE — Progress Notes (Signed)
Tolerated reclast infusion without event.

## 2016-04-25 NOTE — Discharge Instructions (Signed)
Zoledronic Acid injection (Paget's Disease, Osteoporosis) / Reclast infusion  °What is this medicine? °ZOLEDRONIC ACID (ZOE le dron ik AS id) lowers the amount of calcium loss from bone. It is used to treat Paget's disease and osteoporosis in women. °This medicine may be used for other purposes; ask your health care provider or pharmacist if you have questions. °COMMON BRAND NAME(S): Reclast, Zometa °What should I tell my health care provider before I take this medicine? °They need to know if you have any of these conditions: °-aspirin-sensitive asthma °-cancer, especially if you are receiving medicines used to treat cancer °-dental disease or wear dentures °-infection °-kidney disease °-low levels of calcium in the blood °-past surgery on the parathyroid gland or intestines °-receiving corticosteroids like dexamethasone or prednisone °-an unusual or allergic reaction to zoledronic acid, other medicines, foods, dyes, or preservatives °-pregnant or trying to get pregnant °-breast-feeding °How should I use this medicine? °This medicine is for infusion into a vein. It is given by a health care professional in a hospital or clinic setting. °Talk to your pediatrician regarding the use of this medicine in children. This medicine is not approved for use in children. °Overdosage: If you think you have taken too much of this medicine contact a poison control center or emergency room at once. °NOTE: This medicine is only for you. Do not share this medicine with others. °What if I miss a dose? °It is important not to miss your dose. Call your doctor or health care professional if you are unable to keep an appointment. °What may interact with this medicine? °-certain antibiotics given by injection °-NSAIDs, medicines for pain and inflammation, like ibuprofen or naproxen °-some diuretics like bumetanide, furosemide °-teriparatide °This list may not describe all possible interactions. Give your health care provider a list of all  the medicines, herbs, non-prescription drugs, or dietary supplements you use. Also tell them if you smoke, drink alcohol, or use illegal drugs. Some items may interact with your medicine. °What should I watch for while using this medicine? °Visit your doctor or health care professional for regular checkups. It may be some time before you see the benefit from this medicine. Do not stop taking your medicine unless your doctor tells you to. Your doctor may order blood tests or other tests to see how you are doing. °Women should inform their doctor if they wish to become pregnant or think they might be pregnant. There is a potential for serious side effects to an unborn child. Talk to your health care professional or pharmacist for more information. °You should make sure that you get enough calcium and vitamin D while you are taking this medicine. Discuss the foods you eat and the vitamins you take with your health care professional. °Some people who take this medicine have severe bone, joint, and/or muscle pain. This medicine may also increase your risk for jaw problems or a broken thigh bone. Tell your doctor right away if you have severe pain in your jaw, bones, joints, or muscles. Tell your doctor if you have any pain that does not go away or that gets worse. °Tell your dentist and dental surgeon that you are taking this medicine. You should not have major dental surgery while on this medicine. See your dentist to have a dental exam and fix any dental problems before starting this medicine. Take good care of your teeth while on this medicine. Make sure you see your dentist for regular follow-up appointments. °What side effects may I notice   from receiving this medicine? Side effects that you should report to your doctor or health care professional as soon as possible: -allergic reactions like skin rash, itching or hives, swelling of the face, lips, or tongue -anxiety, confusion, or depression -breathing  problems -changes in vision -eye pain -feeling faint or lightheaded, falls -jaw pain, especially after dental work -mouth sores -muscle cramps, stiffness, or weakness -redness, blistering, peeling or loosening of the skin, including inside the mouth -trouble passing urine or change in the amount of urine Side effects that usually do not require medical attention (report to your doctor or health care professional if they continue or are bothersome): -bone, joint, or muscle pain -constipation -diarrhea -fever -hair loss -irritation at site where injected -loss of appetite -nausea, vomiting -stomach upset -trouble sleeping -trouble swallowing -weak or tired This list may not describe all possible side effects. Call your doctor for medical advice about side effects. You may report side effects to FDA at 1-800-FDA-1088. Where should I keep my medicine? This drug is given in a hospital or clinic and will not be stored at home. NOTE: This sheet is a summary. It may not cover all possible information. If you have questions about this medicine, talk to your doctor, pharmacist, or health care provider.  2018 Elsevier/Gold Standard (2013-06-26 14:19:57)

## 2016-05-06 DIAGNOSIS — M546 Pain in thoracic spine: Secondary | ICD-10-CM | POA: Diagnosis not present

## 2016-05-16 DIAGNOSIS — M546 Pain in thoracic spine: Secondary | ICD-10-CM | POA: Diagnosis not present

## 2016-07-10 DIAGNOSIS — M810 Age-related osteoporosis without current pathological fracture: Secondary | ICD-10-CM | POA: Diagnosis not present

## 2016-07-10 DIAGNOSIS — H3530 Unspecified macular degeneration: Secondary | ICD-10-CM | POA: Diagnosis not present

## 2016-07-10 DIAGNOSIS — G458 Other transient cerebral ischemic attacks and related syndromes: Secondary | ICD-10-CM | POA: Diagnosis not present

## 2016-07-10 DIAGNOSIS — K219 Gastro-esophageal reflux disease without esophagitis: Secondary | ICD-10-CM | POA: Diagnosis not present

## 2016-07-10 DIAGNOSIS — I10 Essential (primary) hypertension: Secondary | ICD-10-CM | POA: Diagnosis not present

## 2016-07-10 DIAGNOSIS — E784 Other hyperlipidemia: Secondary | ICD-10-CM | POA: Diagnosis not present

## 2016-07-10 DIAGNOSIS — Z6829 Body mass index (BMI) 29.0-29.9, adult: Secondary | ICD-10-CM | POA: Diagnosis not present

## 2016-07-10 DIAGNOSIS — E559 Vitamin D deficiency, unspecified: Secondary | ICD-10-CM | POA: Diagnosis not present

## 2016-07-10 DIAGNOSIS — D638 Anemia in other chronic diseases classified elsewhere: Secondary | ICD-10-CM | POA: Diagnosis not present

## 2016-07-10 DIAGNOSIS — M546 Pain in thoracic spine: Secondary | ICD-10-CM | POA: Diagnosis not present

## 2016-07-10 DIAGNOSIS — F321 Major depressive disorder, single episode, moderate: Secondary | ICD-10-CM | POA: Diagnosis not present

## 2016-08-24 NOTE — Progress Notes (Signed)
Mallory Jensen Date of Birth: 10/13/1922 Medical Record #[id number]  History of Present Illness: Mallory Jensen is seen for follow up of HTN. She has a history of hypertension, hyperlipidemia, and right bundle branch block.   She has done well over this past year from a cardiac standpoint. She denies any chest pain or SOB.  She is more sedentary but still lives alone and still drives. She still enjoys cooking and entertaining. She does note some chronic back pain.  Current Outpatient Prescriptions on File Prior to Visit  Medication Sig Dispense Refill  . acetaminophen (TYLENOL) 325 MG tablet Take 2 tablets (650 mg total) by mouth every 6 (six) hours as needed.    Marland Kitchen amLODipine (NORVASC) 5 MG tablet Take 1 tablet (5 mg total) by mouth daily.    Marland Kitchen aspirin 325 MG tablet Take 1 tablet (325 mg total) by mouth daily.    . calcium-vitamin D (OSCAL WITH D) 500-200 MG-UNIT per tablet Take 1 tablet by mouth daily.    . ferrous fumarate (HEMOCYTE - 106 MG FE) 325 (106 FE) MG TABS Take 1 tablet by mouth daily.    Marland Kitchen ibuprofen (ADVIL,MOTRIN) 200 MG tablet Take 600 mg by mouth 2 (two) times daily.    Marland Kitchen losartan (COZAAR) 100 MG tablet Take 1 tablet (100 mg total) by mouth daily.    . metoprolol tartrate (LOPRESSOR) 25 MG tablet Take 12.5 mg by mouth 2 (two) times daily.    . Multiple Vitamin (MULTIVITAMIN WITH MINERALS) TABS Take 1 tablet by mouth daily.    . pantoprazole (PROTONIX) 40 MG tablet Take 1 tablet (40 mg total) by mouth daily.    . polymixin-bacitracin (POLYSPORIN) 500-10000 UNIT/GM OINT ointment Apply 1 application topically 2 (two) times daily.     No current facility-administered medications on file prior to visit.     Allergies  Allergen Reactions  . Codeine Other (See Comments)    UNKNOWN  . Penicillins Other (See Comments)    rash  . Tramadol Hcl     "Made me crazy"    Past Medical History:  Diagnosis Date  . Acute pancreatitis January 2014  . Chronic fatigue   .  Compression fracture of vertebral column ([place])   . HTN (hypertension)   . Hypercholesteremia   . Macular hole   . Mild aortic stenosis   . MRSA (methicillin resistant Staphylococcus aureus)   . Ovarian cyst   . RBBB   . Staph infection     Past Surgical History:  Procedure Laterality Date  . APPENDECTOMY    . athroscopy Right shoulder    . Biopsy Left Breast x 2    . CESAREAN SECTION    . CHOLECYSTECTOMY      History  Smoking Status  . Never Smoker  Smokeless Tobacco  . Never Used    History  Alcohol Use  . Yes    Comment: glass of wine infrequently    Family History  Problem Relation Age of Onset  . Heart disease Brother   . Heart disease Brother   . Cancer Unknown   . Heart disease Unknown     Review of Systems: As noted in HPI.  All other systems were reviewed and are negative.  Physical Exam: BP 140/78   Pulse (!) 56   Ht 5' (1.524 m)   Wt 153 lb (69.4 kg)   BMI 29.88 kg/m  She is an elderly white female in no acute distress. Her HEENT exam is unremarkable.  She has no JVD or bruits. Lungs are clear. Cardiac exam reveals a grade 1/6 systolic ejection murmur the right upper sternal border. There is no diastolic murmur or S3. Abdomen is obese, soft, nontender. She is kyphotic. She has no edema. Pedal pulses are good. She is alert and oriented x3. Cranial nerves II through XII are intact.  LABORATORY DATA: Dated 2.27.18: cholesterol 216, triglycerides 358, HDL 34, LDL 110.  Dated 07/11/16: BUN 26 creatinine 1.1, other chemistries normal.  ECG demonstrates normal sinus rhythm with an old right bundle branch block. Pulse is 56 beats per minute. First degree AV block. I have personally reviewed and interpreted this study.   Assessment / Plan: 1. HTN- BP is well controlled. . Continue current therapy.   2. RBBB chronic.  Follow up in one year.

## 2016-08-28 ENCOUNTER — Ambulatory Visit (INDEPENDENT_AMBULATORY_CARE_PROVIDER_SITE_OTHER): Payer: Medicare Other | Admitting: Cardiology

## 2016-08-28 ENCOUNTER — Encounter: Payer: Self-pay | Admitting: Cardiology

## 2016-08-28 VITALS — BP 140/78 | HR 56 | Ht 60.0 in | Wt 153.0 lb

## 2016-08-28 DIAGNOSIS — E7800 Pure hypercholesterolemia, unspecified: Secondary | ICD-10-CM

## 2016-08-28 DIAGNOSIS — I10 Essential (primary) hypertension: Secondary | ICD-10-CM | POA: Diagnosis not present

## 2016-08-28 DIAGNOSIS — I4510 Unspecified right bundle-branch block: Secondary | ICD-10-CM

## 2016-08-28 MED ORDER — ASPIRIN EC 81 MG PO TBEC: 81.0000 mg | DELAYED_RELEASE_TABLET | Freq: Every day | ORAL | 3 refills | Status: AC

## 2016-08-28 NOTE — Patient Instructions (Signed)
Reduce ASA to 81 mg daily  Continue your other therapy  I will see you in one year 

## 2016-09-27 DIAGNOSIS — H35342 Macular cyst, hole, or pseudohole, left eye: Secondary | ICD-10-CM | POA: Diagnosis not present

## 2016-09-29 NOTE — Progress Notes (Signed)
[facility] Note  09/30/2016     CHIEF COMPLAINT Patient presents for Retinal Evaluation   HISTORY OF PRESENT ILLNESS: Mallory Jensen is a 83 y.o. female who presents to the clinic today for:   HPI    Pt presents for retinal evaluation on the referral of Dr. Ellie Lunch; Pt states that she was told that she has a macular hole OS; Pt reports that she had macular hole sx in OD in 2015; Pt states that she is unable to read with OS, states that the letter disappear; Pt reports seeing "a brown spot" off and on; Pt denies flashes, denies ocular pain; Pt reports using systane OU prn; Pt denies taking eye vits;    Last edited by [name] on 09/30/2016 10:27 AM. (History)    Pt and daughter unable to remember name of retina surgeon who performed mac hole surgery OD in 2015.  Referring physician: Luberta Mutter, MD [place], [place] [id number]  HISTORICAL INFORMATION:   Selected notes from the MEDICAL RECORD NUMBER Referral from Dr. Ellie Lunch for mac hole OS  Past ocular history of macular hole OD s/p repair     CURRENT MEDICATIONS: No current outpatient prescriptions on file. (Ophthalmic Drugs)   No current facility-administered medications for this visit.  (Ophthalmic Drugs)   Current Outpatient Prescriptions (Other)  Medication Sig  . acetaminophen (TYLENOL) 325 MG tablet Take 2 tablets (650 mg total) by mouth every 6 (six) hours as needed.  Marland Kitchen amLODipine (NORVASC) 5 MG tablet Take 1 tablet (5 mg total) by mouth daily.  Marland Kitchen aspirin EC 81 MG tablet Take 1 tablet (81 mg total) by mouth daily.  . calcium-vitamin D (OSCAL WITH D) 500-200 MG-UNIT per tablet Take 1 tablet by mouth daily.  . ferrous fumarate (HEMOCYTE - 106 MG FE) 325 (106 FE) MG TABS Take 1 tablet by mouth daily.  Marland Kitchen ibuprofen (ADVIL,MOTRIN) 200 MG tablet Take 600 mg by mouth 2 (two) times daily.  Marland Kitchen losartan (COZAAR) 100 MG tablet Take 1 tablet (100 mg total) by mouth daily.  .  metoprolol tartrate (LOPRESSOR) 25 MG tablet Take 12.5 mg by mouth 2 (two) times daily.  . Multiple Vitamin (MULTIVITAMIN WITH MINERALS) TABS Take 1 tablet by mouth daily.  . pantoprazole (PROTONIX) 40 MG tablet Take 1 tablet (40 mg total) by mouth daily.  . polymixin-bacitracin (POLYSPORIN) 500-10000 UNIT/GM OINT ointment Apply 1 application topically 2 (two) times daily.   No current facility-administered medications for this visit.  (Other)      REVIEW OF SYSTEMS: ROS    Positive for: Musculoskeletal, Eyes   Negative for: Constitutional, Gastrointestinal, Neurological, Skin, Genitourinary, HENT, Endocrine, Cardiovascular, Respiratory, Psychiatric, Allergic/Imm, Heme/Lymph   Last edited by [name] on 09/30/2016 10:46 AM. (History)       ALLERGIES Allergies  Allergen Reactions  . Codeine Other (See Comments)    UNKNOWN  . Penicillins Other (See Comments)    rash  . Tramadol Hcl     "Made me crazy"    PAST MEDICAL HISTORY Past Medical History:  Diagnosis Date  . Acute pancreatitis January 2014  . Chronic fatigue   . Compression fracture of vertebral column ([place])   . HTN (hypertension)   . Hypercholesteremia   . Macular hole   . Mild aortic stenosis   . MRSA (methicillin resistant Staphylococcus aureus)   . Ovarian cyst   . RBBB   . Staph infection    Past Surgical History:  Procedure  Laterality Date  . APPENDECTOMY    . athroscopy Right shoulder    . Biopsy Left Breast x 2    . CESAREAN SECTION    . CHOLECYSTECTOMY    . PARS PLANA VITRECTOMY W/ REPAIR OF MACULAR HOLE Right 2015    FAMILY HISTORY Family History  Problem Relation Age of Onset  . Cataracts Mother   . Heart disease Brother   . Heart disease Brother   . Cancer Unknown   . Heart disease Unknown   . Amblyopia Neg Hx   . Blindness Neg Hx   . Glaucoma Neg Hx   . Macular degeneration Neg Hx   . Retinal detachment Neg Hx   . Strabismus Neg Hx   . Retinitis pigmentosa Neg Hx      SOCIAL HISTORY Social History  Substance Use Topics  . Smoking status: Never Smoker  . Smokeless tobacco: Never Used  . Alcohol use Yes     Comment: glass of wine infrequently         OPHTHALMIC EXAM:  Base Eye Exam    Visual Acuity (Snellen - Linear)      Right Left   Dist [place] 20/60 20/100   Dist ph [place] NI NI   Pt states that she has large floater blocking OS VA when reading chart       Tonometry (Tonopen, 10:43 AM)      Right Left   Pressure 18 16       Pupils      Dark Light Shape React APD   Right 3 2 Round Slow None   Left 3 2 Round Slow None       Visual Fields (Counting fingers)      Left Right    Full Full       Extraocular Movement      Right Left    Full, Ortho Full, Ortho       Neuro/Psych    Oriented x3:  Yes   Mood/Affect:  Normal       Dilation    Both eyes:  1.0% Mydriacyl, 2.5% Phenylephrine @ 10:43 AM        Slit Lamp and Fundus Exam    External Exam      Right Left   External Normal Normal       Slit Lamp Exam      Right Left   Lids/Lashes Dermatochalasis - upper lid Dermatochalasis - upper lid   Conjunctiva/Sclera White and quiet White and quiet   Cornea Clear Clear   Anterior Chamber Deep and quiet Deep and quiet   Iris Round; dilated Round; dilated   Lens Posterior chamber intraocular lens, Posterior capsular opacification - trace Posterior chamber intraocular lens, 1+ Posterior capsular opacification    Vitreous Post Vitrectomy  Vitreous syneresis       Fundus Exam      Right Left   Disc Normal Mild PPA   C/D Ratio 0.3 0.25   Macula RPE changes; closed macular hole Macular hole; mild RPE changes   Vessels Vascular attenuation - mild Vascular attenuation - mild   Periphery Flat, attached, reticular degeneration Flat, attached, reticular degeneration        Refraction    Manifest Refraction    Unable due to pt coop          IMAGING AND PROCEDURES  Imaging and Procedures for 09/30/16  OCT, Retina - OU -  Both Eyes     Right Eye Quality was good.  Central Foveal Thickness: 220. Progression has no prior data. Findings include abnormal foveal contour (Closed macular hole, irregular inner retinal surface; no IRF or SRF).   Left Eye Central Foveal Thickness: 210. Progression has no prior data. Findings include macular hole, cystoid macular edema.   Notes Images taken, stored on drive               ASSESSMENT/PLAN:    ICD-10-CM   1. Macular hole of left eye H35.342 OCT, Retina - OU - Both Eyes  2. Retinal edema H35.81 OCT, Retina - OU - Both Eyes  3. Macular hole, right H35.341   4. Pseudophakia of both eyes Z96.1   5. PCO (posterior capsular opacification), bilateral H26.493     1. Full-thickness macular hole, left eye.   - The natural history, anatomy, potential for loss of vision, and treatment options including vitrectomy techniques and the complications of endophthalmitis, retinal detachment, vitreous hemorrhage, cataract progression and permanent vision loss discussed with the patient. - pt wishes to proceed with surgical repair - recommend 25g PPV/MP/Gas OS under local/MAC anesthesia with overnight observation - all questions answered - informed consent obtained and signed - OR scheduled for next Wednesday 10/09/16 - H&P completed and orders placed  2. Retinal edema, left eye - mild CME surrounding macular hole - see above  3. History of macular hole, right eye - s/p mac hole repair in 2015 -- ?Dr. Starling Manns - hole closed, stable, VA 20/60 - monitor  4,5. Pseudophakia w/ mild PCO OU - PCIOL in good position - PCO not visually significant at this time - may benefit from YAG capsulotomy w/ Dr. Ellie Lunch in the future   Ophthalmic Meds Ordered this visit:  No orders of the defined types were placed in this encounter.      Return in about 2 weeks (around 10/16/2016) for POV -- week 1 visit.  There are no Patient Instructions on file for this visit.   Explained  the diagnoses, plan, and follow up with the patient and they expressed understanding.  Patient expressed understanding of the importance of proper follow up care.   Gardiner Sleeper, M.D., Ph.D. Vitreoretinal Surgeon [facility] 09/30/16     Abbreviations: M myopia (nearsighted); A astigmatism; H hyperopia (farsighted); P presbyopia; Mrx spectacle prescription;  CTL contact lenses; OD right eye; OS left eye; OU both eyes  XT exotropia; ET esotropia; PEK punctate epithelial keratitis; PEE punctate epithelial erosions; DES dry eye syndrome; MGD meibomian gland dysfunction; ATs artificial tears; PFAT's preservative free artificial tears; [place] nuclear sclerotic cataract; PSC posterior subcapsular cataract; ERM epi-retinal membrane; PVD posterior vitreous detachment; RD retinal detachment; DM diabetes mellitus; DR diabetic retinopathy; NPDR non-proliferative diabetic retinopathy; PDR proliferative diabetic retinopathy; CSME clinically significant macular edema; DME diabetic macular edema; dbh dot blot hemorrhages; CWS cotton wool spot; POAG primary open angle glaucoma; C/D cup-to-disc ratio; HVF humphrey visual field; GVF goldmann visual field; OCT optical coherence tomography; IOP intraocular pressure; BRVO Branch retinal vein occlusion; CRVO central retinal vein occlusion; CRAO central retinal artery occlusion; BRAO branch retinal artery occlusion; RT retinal tear; SB scleral buckle; PPV pars plana vitrectomy; VH Vitreous hemorrhage; PRP panretinal laser photocoagulation; IVK intravitreal kenalog; VMT vitreomacular traction; MH Macular hole;  NVD neovascularization of the disc; NVE neovascularization elsewhere; AREDS age related eye disease study; ARMD age related macular degeneration; POAG primary open angle glaucoma; EBMD epithelial/anterior basement membrane dystrophy; ACIOL anterior chamber intraocular lens; IOL intraocular lens; PCIOL posterior chamber intraocular lens; Phaco/IOL  phacoemulsification with intraocular lens placement; [place] photorefractive keratectomy; LASIK laser assisted in situ keratomileusis; HTN hypertension; DM diabetes mellitus; COPD chronic obstructive pulmonary disease

## 2016-09-30 ENCOUNTER — Encounter (INDEPENDENT_AMBULATORY_CARE_PROVIDER_SITE_OTHER): Payer: Self-pay | Admitting: Ophthalmology

## 2016-09-30 ENCOUNTER — Ambulatory Visit (INDEPENDENT_AMBULATORY_CARE_PROVIDER_SITE_OTHER): Payer: Medicare Other | Admitting: Ophthalmology

## 2016-09-30 DIAGNOSIS — Z961 Presence of intraocular lens: Secondary | ICD-10-CM

## 2016-09-30 DIAGNOSIS — H3581 Retinal edema: Secondary | ICD-10-CM

## 2016-09-30 DIAGNOSIS — H35341 Macular cyst, hole, or pseudohole, right eye: Secondary | ICD-10-CM

## 2016-09-30 DIAGNOSIS — H26493 Other secondary cataract, bilateral: Secondary | ICD-10-CM | POA: Diagnosis not present

## 2016-09-30 DIAGNOSIS — H35342 Macular cyst, hole, or pseudohole, left eye: Secondary | ICD-10-CM | POA: Diagnosis not present

## 2016-09-30 NOTE — H&P (Signed)
Mallory Jensen is an 83 y.o. female.   Chief Complaint: Decreased vision secondary to macular hole, left eye HPI: Pt presents to clinic with several week history of decreased vision in left eye. Referred by Dr. Ellie Lunch who noted a macular hole OS on exam last week.  Past Medical History:  Diagnosis Date  . Acute pancreatitis January 2014  . Chronic fatigue   . Compression fracture of vertebral column ([place])   . HTN (hypertension)   . Hypercholesteremia   . Macular hole   . Mild aortic stenosis   . MRSA (methicillin resistant Staphylococcus aureus)   . Ovarian cyst   . RBBB   . Staph infection     Past Surgical History:  Procedure Laterality Date  . APPENDECTOMY    . athroscopy Right shoulder    . Biopsy Left Breast x 2    . CESAREAN SECTION    . CHOLECYSTECTOMY    . PARS PLANA VITRECTOMY W/ REPAIR OF MACULAR HOLE Right 2015    Family History  Problem Relation Age of Onset  . Cataracts Mother   . Heart disease Brother   . Heart disease Brother   . Cancer Unknown   . Heart disease Unknown   . Amblyopia Neg Hx   . Blindness Neg Hx   . Glaucoma Neg Hx   . Macular degeneration Neg Hx   . Retinal detachment Neg Hx   . Strabismus Neg Hx   . Retinitis pigmentosa Neg Hx    Social History:  reports that she has never smoked. She has never used smokeless tobacco. She reports that she drinks alcohol. She reports that she does not use drugs.  Allergies:  Allergies  Allergen Reactions  . Codeine Other (See Comments)    UNKNOWN  . Penicillins Other (See Comments)    rash  . Tramadol Hcl     "Made me crazy"    No prescriptions prior to admission.    Review of systems otherwise negative  There were no vitals taken for this visit.  Physical exam: Mental status: oriented x3. Eyes: See eye exam associated with this date of surgery Ears, Nose, Throat: within normal limits Neck: Within Normal limits General: within normal limits Chest: Within normal limits Breast:  deferred Heart: Within normal limits Abdomen: Within normal limits GU: deferred Extremities: within normal limits Skin: within normal limits  Assessment/Plan 1. Full-thickness macular hole, left eye 2. History of macular hole, right eye -- now s/p repair (done in 2015) 3. Pseudophakia OU  Plan: To [facility] for macular hole repair, left eye  - 25-gauge pars plana vitrectomy, ICG staining, membrane peel, gas, LEFT EYE  - local / MAC anesthesia  - outpt with extended recovery  - scheduled for 8.29.18, 0930 am  Bernarda Caffey 09/30/2016, 3:48 PM

## 2016-10-02 DIAGNOSIS — K219 Gastro-esophageal reflux disease without esophagitis: Secondary | ICD-10-CM | POA: Diagnosis not present

## 2016-10-02 DIAGNOSIS — H3530 Unspecified macular degeneration: Secondary | ICD-10-CM | POA: Diagnosis not present

## 2016-10-02 DIAGNOSIS — I10 Essential (primary) hypertension: Secondary | ICD-10-CM | POA: Diagnosis not present

## 2016-10-02 DIAGNOSIS — G458 Other transient cerebral ischemic attacks and related syndromes: Secondary | ICD-10-CM | POA: Diagnosis not present

## 2016-10-02 DIAGNOSIS — M810 Age-related osteoporosis without current pathological fracture: Secondary | ICD-10-CM | POA: Diagnosis not present

## 2016-10-02 DIAGNOSIS — D638 Anemia in other chronic diseases classified elsewhere: Secondary | ICD-10-CM | POA: Diagnosis not present

## 2016-10-02 DIAGNOSIS — E781 Pure hyperglyceridemia: Secondary | ICD-10-CM | POA: Diagnosis not present

## 2016-10-02 DIAGNOSIS — E559 Vitamin D deficiency, unspecified: Secondary | ICD-10-CM | POA: Diagnosis not present

## 2016-10-02 DIAGNOSIS — Z23 Encounter for immunization: Secondary | ICD-10-CM | POA: Diagnosis not present

## 2016-10-02 DIAGNOSIS — M546 Pain in thoracic spine: Secondary | ICD-10-CM | POA: Diagnosis not present

## 2016-10-02 DIAGNOSIS — Z6828 Body mass index (BMI) 28.0-28.9, adult: Secondary | ICD-10-CM | POA: Diagnosis not present

## 2016-10-07 ENCOUNTER — Encounter (HOSPITAL_COMMUNITY): Payer: Self-pay | Admitting: *Deleted

## 2016-10-07 NOTE — Progress Notes (Addendum)
Pt denies SOB and chest pain. Pt is under the care of Dr. Peter Martinique, Cardiology. Pt denies having a cardiac cath. Pt denies having a chest x ray within the last year. Pt had recent labs at Dr. Osborne Casco; records requested. Pt stated that MD advised that she continue taking Aspirin. When attempting to tell pt to take Norvasc and Metoprolol and Protonix the morning of surgery with a small sip of water, pt stated that MD advised that she is not to take any morning medications. Pt stated that she will bring her AM medications in with her to take after surgery. Pt made aware to stop taking vitamins, fish oil, Ginko, Glucosamine and herbal medications. Do not take any NSAIDs ie: Ibuprofen, Advil, Naproxen (Anaprox/Aleve), Motrin, BC and Goody Powder. Pt verbalized understanding of all pre-op instructions. Anesthesia asked to review pt history.

## 2016-10-08 NOTE — Progress Notes (Signed)
A detailed message was left on Mallory Jensen's (Dr. Dairl Ponder assistant) voice mailbox, to make MD aware that pt stated that she was instructed by MD not to take any medications the morning of surgery despite pt education on the importance to take BP medications; pt was instructed to take Norvasc, Metoprolol, and Protonix with a small sip of water. In addition, Mallory Jensen was also asked to make MD aware of the anesthesia protocol to take Beta Blockers and the chance of pt surgery being cancelled if pt BP is elevated; awaiting a return call.

## 2016-10-08 NOTE — Progress Notes (Signed)
Mallory Jensen called to state that MD had called pt and instructed her to take medications the morning of procedure.

## 2016-10-08 NOTE — Progress Notes (Signed)
Anesthesia Chart Review:  Pt is a same day work up  - PCP is Domenick Gong, MD - Cardiologist is Peter Martinique, MD who sees pt annually for HTN. Last office visit 08/28/16.   Pt is a 83 year old female scheduled for L pars plana vitrectomy on 10/09/2016 with Bernarda Caffey, M.D.  [facility] includes: HTN, mild aortic stenosis, RBBB, hyperlipidemia. Never smoker.  Medications include: Amlodipine, ASA 81 mg, iron, losartan, metoprolol, Protonix  Labs will be obtained DOS  EKG 08/28/16: Sinus bradycardia (56 bpm) with first-degree AV block. RBBB. Minimal voltage criteria for LVH, may be normal variant.  Echo 08/30/12:  - Left ventricle: The cavity size was normal. Wall thicknesswas increased in a pattern of moderate LVH. Systolicfunction was normal. The estimated ejection fraction wasin the range of 60% to 65%. Doppler parameters areconsistent with abnormal left ventricular relaxation(grade 1 diastolic dysfunction). - Mitral valve: Mildly calcified annulus. - Left atrium: The atrium was mildly dilated.  If labs acceptable DOS, I anticipate pt can proceed as scheduled.   Willeen Cass, FNP-BC [facility]/Anesthesiology Phone: [phone] 10/08/2016 12:03 PM

## 2016-10-09 ENCOUNTER — Ambulatory Visit (HOSPITAL_COMMUNITY)
Admission: RE | Admit: 2016-10-09 | Discharge: 2016-10-10 | Disposition: A | Discharge status: Home / Self Care | Payer: Medicare Other | Source: Ambulatory Visit | Attending: Ophthalmology | Admitting: Ophthalmology

## 2016-10-09 ENCOUNTER — Encounter (HOSPITAL_COMMUNITY)
Admission: RE | Disposition: A | Discharge status: Home / Self Care | Payer: Self-pay | Source: Ambulatory Visit | Attending: Ophthalmology

## 2016-10-09 ENCOUNTER — Ambulatory Visit (HOSPITAL_COMMUNITY): Payer: Medicare Other | Admitting: Emergency Medicine

## 2016-10-09 ENCOUNTER — Encounter (HOSPITAL_COMMUNITY): Payer: Self-pay | Admitting: *Deleted

## 2016-10-09 DIAGNOSIS — H35341 Macular cyst, hole, or pseudohole, right eye: Secondary | ICD-10-CM | POA: Diagnosis present

## 2016-10-09 DIAGNOSIS — Z885 Allergy status to narcotic agent status: Secondary | ICD-10-CM | POA: Insufficient documentation

## 2016-10-09 DIAGNOSIS — E119 Type 2 diabetes mellitus without complications: Secondary | ICD-10-CM | POA: Insufficient documentation

## 2016-10-09 DIAGNOSIS — G8929 Other chronic pain: Secondary | ICD-10-CM | POA: Insufficient documentation

## 2016-10-09 DIAGNOSIS — Z8614 Personal history of Methicillin resistant Staphylococcus aureus infection: Secondary | ICD-10-CM | POA: Insufficient documentation

## 2016-10-09 DIAGNOSIS — I4510 Unspecified right bundle-branch block: Secondary | ICD-10-CM | POA: Insufficient documentation

## 2016-10-09 DIAGNOSIS — H33302 Unspecified retinal break, left eye: Secondary | ICD-10-CM | POA: Diagnosis not present

## 2016-10-09 DIAGNOSIS — Z8249 Family history of ischemic heart disease and other diseases of the circulatory system: Secondary | ICD-10-CM | POA: Diagnosis not present

## 2016-10-09 DIAGNOSIS — I10 Essential (primary) hypertension: Secondary | ICD-10-CM | POA: Insufficient documentation

## 2016-10-09 DIAGNOSIS — Z888 Allergy status to other drugs, medicaments and biological substances status: Secondary | ICD-10-CM | POA: Diagnosis not present

## 2016-10-09 DIAGNOSIS — I350 Nonrheumatic aortic (valve) stenosis: Secondary | ICD-10-CM | POA: Diagnosis not present

## 2016-10-09 DIAGNOSIS — Z880 Allergy status to penicillin: Secondary | ICD-10-CM | POA: Diagnosis not present

## 2016-10-09 DIAGNOSIS — Z809 Family history of malignant neoplasm, unspecified: Secondary | ICD-10-CM | POA: Diagnosis not present

## 2016-10-09 DIAGNOSIS — N83209 Unspecified ovarian cyst, unspecified side: Secondary | ICD-10-CM | POA: Insufficient documentation

## 2016-10-09 DIAGNOSIS — R5382 Chronic fatigue, unspecified: Secondary | ICD-10-CM | POA: Diagnosis not present

## 2016-10-09 DIAGNOSIS — Z9049 Acquired absence of other specified parts of digestive tract: Secondary | ICD-10-CM | POA: Diagnosis not present

## 2016-10-09 DIAGNOSIS — R51 Headache: Secondary | ICD-10-CM | POA: Insufficient documentation

## 2016-10-09 DIAGNOSIS — M549 Dorsalgia, unspecified: Secondary | ICD-10-CM | POA: Insufficient documentation

## 2016-10-09 DIAGNOSIS — H35342 Macular cyst, hole, or pseudohole, left eye: Secondary | ICD-10-CM | POA: Diagnosis not present

## 2016-10-09 DIAGNOSIS — E7800 Pure hypercholesterolemia, unspecified: Secondary | ICD-10-CM | POA: Diagnosis not present

## 2016-10-09 HISTORY — DX: Age-related osteoporosis without current pathological fracture: M81.0

## 2016-10-09 HISTORY — DX: Headache: R51

## 2016-10-09 HISTORY — DX: Other chronic pain: G89.29

## 2016-10-09 HISTORY — DX: Headache, unspecified: R51.9

## 2016-10-09 HISTORY — PX: PARS PLANA VITRECTOMY: SHX2166

## 2016-10-09 HISTORY — DX: Dorsalgia, unspecified: M54.9

## 2016-10-09 LAB — CBC
HCT: 29.1 % — ABNORMAL LOW (ref 36.0–46.0)
HEMOGLOBIN: 10.1 g/dL — AB (ref 12.0–15.0)
MCH: 30.6 pg (ref 26.0–34.0)
MCHC: 34.7 g/dL (ref 30.0–36.0)
MCV: 88.2 fL (ref 78.0–100.0)
PLATELETS: 287 10*3/uL (ref 150–400)
RBC: 3.3 MIL/uL — AB (ref 3.87–5.11)
RDW: 14.5 % (ref 11.5–15.5)
WBC: 10 10*3/uL (ref 4.0–10.5)

## 2016-10-09 LAB — BASIC METABOLIC PANEL
Anion gap: 9 (ref 5–15)
BUN: 26 mg/dL — ABNORMAL HIGH (ref 6–20)
CALCIUM: 9.7 mg/dL (ref 8.9–10.3)
CO2: 25 mmol/L (ref 22–32)
CREATININE: 1.08 mg/dL — AB (ref 0.44–1.00)
Chloride: 104 mmol/L (ref 101–111)
GFR, EST AFRICAN AMERICAN: 50 mL/min — AB (ref >60)
GFR, EST NON AFRICAN AMERICAN: 43 mL/min — AB (ref >60)
Glucose, Bld: 96 mg/dL (ref 65–99)
Potassium: 4.2 mmol/L (ref 3.5–5.1)
SODIUM: 138 mmol/L (ref 135–145)

## 2016-10-09 SURGERY — PARS PLANA VITRECTOMY WITH 25 GAUGE
Anesthesia: Monitor Anesthesia Care | Site: Eye | Laterality: Left

## 2016-10-09 MED ORDER — HYDROCODONE-ACETAMINOPHEN 5-325 MG PO TABS
1.0000 | ORAL_TABLET | ORAL | Status: DC | PRN
Start: 2016-10-09 — End: 2016-10-10

## 2016-10-09 MED ORDER — STERILE WATER FOR INJECTION IJ SOLN
INTRAMUSCULAR | Status: DC | PRN
  Administered 2016-10-09: 20 mL

## 2016-10-09 MED ORDER — TRIAMCINOLONE ACETONIDE 40 MG/ML IJ SUSP
INTRAMUSCULAR | Status: DC | PRN
  Administered 2016-10-09: 40 mg via INTRAMUSCULAR

## 2016-10-09 MED ORDER — STERILE WATER FOR INJECTION IJ SOLN: INTRAMUSCULAR | Status: DC | PRN

## 2016-10-09 MED ORDER — DORZOLAMIDE HCL-TIMOLOL MAL 2-0.5 % OP SOLN
OPHTHALMIC | Status: DC | PRN
  Administered 2016-10-09: 1 [drp] via OPHTHALMIC

## 2016-10-09 MED ORDER — DEXTROSE 5 % IV BOLUS
INTRAVENOUS | Status: AC | PRN
  Administered 2016-10-09: 5 mL via INTRAVENOUS

## 2016-10-09 MED ORDER — DEXAMETHASONE SODIUM PHOSPHATE 10 MG/ML IJ SOLN
INTRAMUSCULAR | Status: DC | PRN
  Administered 2016-10-09: 10 mg via INTRAVENOUS

## 2016-10-09 MED ORDER — EPINEPHRINE PF 1 MG/ML IJ SOLN
INTRAMUSCULAR | Status: DC | PRN
  Administered 2016-10-09: .3 mL

## 2016-10-09 MED ORDER — HYALURONIDASE HUMAN 150 UNIT/ML IJ SOLN
INTRAMUSCULAR | Status: AC
Start: 2016-10-09 — End: 2016-10-09
  Filled 2016-10-09: qty 1

## 2016-10-09 MED ORDER — TRIAMCINOLONE ACETONIDE 40 MG/ML IJ SUSP
INTRAMUSCULAR | Status: AC
  Filled 2016-10-09: qty 5

## 2016-10-09 MED ORDER — BACITRACIN-POLYMYXIN B 500-10000 UNIT/GM OP OINT
TOPICAL_OINTMENT | OPHTHALMIC | Status: DC | PRN
  Administered 2016-10-09: 1 via OPHTHALMIC

## 2016-10-09 MED ORDER — CEFTAZIDIME 1 G IJ SOLR
INTRAMUSCULAR | Status: AC
  Filled 2016-10-09: qty 1

## 2016-10-09 MED ORDER — LIDOCAINE HCL (PF) 2 % IJ SOLN
INTRAMUSCULAR | Status: AC
  Filled 2016-10-09: qty 10

## 2016-10-09 MED ORDER — LIDOCAINE HCL (CARDIAC) 20 MG/ML IV SOLN
INTRAVENOUS | Status: DC | PRN
  Administered 2016-10-09: 40 mg via INTRAVENOUS

## 2016-10-09 MED ORDER — BUPIVACAINE HCL (PF) 0.75 % IJ SOLN
INTRAMUSCULAR | Status: DC | PRN
  Administered 2016-10-09: 10 mL

## 2016-10-09 MED ORDER — TROPICAMIDE 1 % OP SOLN
1.0000 [drp] | OPHTHALMIC | Status: AC | PRN
  Administered 2016-10-09 (×3): 1 [drp] via OPHTHALMIC
  Filled 2016-10-09: qty 15

## 2016-10-09 MED ORDER — ATROPINE SULFATE 1 % OP SOLN
1.0000 [drp] | OPHTHALMIC | Status: AC | PRN
  Administered 2016-10-09 (×3): 1 [drp] via OPHTHALMIC
  Filled 2016-10-09: qty 5

## 2016-10-09 MED ORDER — PREDNISOLONE ACETATE 1 % OP SUSP
1.0000 [drp] | Freq: Four times a day (QID) | OPHTHALMIC | Status: DC
  Administered 2016-10-10: 1 [drp] via OPHTHALMIC
  Filled 2016-10-09 (×2): qty 5

## 2016-10-09 MED ORDER — LOSARTAN POTASSIUM 50 MG PO TABS
100.0000 mg | ORAL_TABLET | Freq: Every day | ORAL | Status: DC
  Filled 2016-10-09: qty 2

## 2016-10-09 MED ORDER — SODIUM HYALURONATE 10 MG/ML IO SOLN
INTRAOCULAR | Status: AC
  Filled 2016-10-09: qty 0.85

## 2016-10-09 MED ORDER — ONDANSETRON HCL 4 MG/2ML IJ SOLN: 4.0000 mg | Freq: Four times a day (QID) | INTRAMUSCULAR | Status: DC | PRN

## 2016-10-09 MED ORDER — BSS PLUS IO SOLN
INTRAOCULAR | Status: AC
  Filled 2016-10-09: qty 500

## 2016-10-09 MED ORDER — TETRACAINE HCL 0.5 % OP SOLN
2.0000 [drp] | OPHTHALMIC | Status: AC
  Administered 2016-10-09: 2 [drp] via OPHTHALMIC
  Filled 2016-10-09: qty 4

## 2016-10-09 MED ORDER — POLYETHYLENE GLYCOL 3350 17 G PO PACK: 17.0000 g | PACK | Freq: Every day | ORAL | Status: DC | PRN

## 2016-10-09 MED ORDER — DEXAMETHASONE SODIUM PHOSPHATE 10 MG/ML IJ SOLN
INTRAMUSCULAR | Status: DC | PRN
  Administered 2016-10-09: 5 mg via INTRAVENOUS

## 2016-10-09 MED ORDER — BACITRACIN-POLYMYXIN B 500-10000 UNIT/GM OP OINT
TOPICAL_OINTMENT | OPHTHALMIC | Status: AC
  Filled 2016-10-09: qty 3.5

## 2016-10-09 MED ORDER — DORZOLAMIDE HCL-TIMOLOL MAL 2-0.5 % OP SOLN
OPHTHALMIC | Status: AC
  Filled 2016-10-09: qty 10

## 2016-10-09 MED ORDER — FENTANYL CITRATE (PF) 100 MCG/2ML IJ SOLN: 25.0000 ug | INTRAMUSCULAR | Status: DC | PRN

## 2016-10-09 MED ORDER — FENTANYL CITRATE (PF) 100 MCG/2ML IJ SOLN
INTRAMUSCULAR | Status: DC | PRN
  Administered 2016-10-09 (×4): 25 ug via INTRAVENOUS

## 2016-10-09 MED ORDER — SODIUM CHLORIDE 0.9 % IV SOLN
INTRAVENOUS | Status: DC
  Administered 2016-10-09 (×2): via INTRAVENOUS

## 2016-10-09 MED ORDER — ATROPINE SULFATE 1 % OP SOLN
OPHTHALMIC | Status: AC
  Filled 2016-10-09: qty 5

## 2016-10-09 MED ORDER — STERILE WATER FOR INJECTION IJ SOLN
INTRAMUSCULAR | Status: AC
  Filled 2016-10-09: qty 20

## 2016-10-09 MED ORDER — SODIUM CHLORIDE 0.9 % IJ SOLN
INTRAMUSCULAR | Status: AC
  Filled 2016-10-09: qty 10

## 2016-10-09 MED ORDER — NAPROXEN 250 MG PO TABS: 250.0000 mg | ORAL_TABLET | Freq: Two times a day (BID) | ORAL | Status: DC | PRN

## 2016-10-09 MED ORDER — PROPOFOL 10 MG/ML IV BOLUS
INTRAVENOUS | Status: AC
  Filled 2016-10-09: qty 20

## 2016-10-09 MED ORDER — BUPIVACAINE HCL (PF) 0.75 % IJ SOLN
INTRAMUSCULAR | Status: AC
  Filled 2016-10-09: qty 10

## 2016-10-09 MED ORDER — NA CHONDROIT SULF-NA HYALURON 40-30 MG/ML IO SOLN
INTRAOCULAR | Status: AC
  Filled 2016-10-09: qty 0.5

## 2016-10-09 MED ORDER — EPINEPHRINE PF 1 MG/ML IJ SOLN
INTRAMUSCULAR | Status: AC
  Filled 2016-10-09: qty 1

## 2016-10-09 MED ORDER — AMLODIPINE BESYLATE 5 MG PO TABS
5.0000 mg | ORAL_TABLET | Freq: Every day | ORAL | Status: DC
  Filled 2016-10-09: qty 1

## 2016-10-09 MED ORDER — MAGNESIUM HYDROXIDE 400 MG/5ML PO SUSP: 15.0000 mL | Freq: Four times a day (QID) | ORAL | Status: DC | PRN

## 2016-10-09 MED ORDER — INDOCYANINE GREEN 25 MG IV SOLR
INTRAVENOUS | Status: DC | PRN
  Administered 2016-10-09: 25 mg via INTRAVENOUS

## 2016-10-09 MED ORDER — LIDOCAINE 2% (20 MG/ML) 5 ML SYRINGE
INTRAMUSCULAR | Status: AC
  Filled 2016-10-09: qty 5

## 2016-10-09 MED ORDER — ONDANSETRON HCL 4 MG/2ML IJ SOLN
INTRAMUSCULAR | Status: DC | PRN
  Administered 2016-10-09: 4 mg via INTRAVENOUS

## 2016-10-09 MED ORDER — ATROPINE SULFATE 1 % OP SOLN
OPHTHALMIC | Status: DC | PRN
  Administered 2016-10-09: 2 [drp] via OPHTHALMIC

## 2016-10-09 MED ORDER — FENTANYL CITRATE (PF) 250 MCG/5ML IJ SOLN
INTRAMUSCULAR | Status: AC
  Filled 2016-10-09: qty 5

## 2016-10-09 MED ORDER — POLYSACCHARIDE IRON COMPLEX 150 MG PO CAPS
150.0000 mg | ORAL_CAPSULE | Freq: Every day | ORAL | Status: DC
  Filled 2016-10-09: qty 1

## 2016-10-09 MED ORDER — PANTOPRAZOLE SODIUM 40 MG PO TBEC
40.0000 mg | DELAYED_RELEASE_TABLET | Freq: Every day | ORAL | Status: DC
  Filled 2016-10-09: qty 1

## 2016-10-09 MED ORDER — NA CHONDROIT SULF-NA HYALURON 40-30 MG/ML IO SOLN
INTRAOCULAR | Status: DC | PRN
  Administered 2016-10-09: 0.5 mL via INTRAOCULAR

## 2016-10-09 MED ORDER — VITAMIN D 1000 UNITS PO TABS
2000.0000 [IU] | ORAL_TABLET | Freq: Every day | ORAL | Status: DC
  Filled 2016-10-09 (×2): qty 2

## 2016-10-09 MED ORDER — ONDANSETRON HCL 4 MG/2ML IJ SOLN
INTRAMUSCULAR | Status: AC
  Filled 2016-10-09: qty 2

## 2016-10-09 MED ORDER — BUPIVACAINE-EPINEPHRINE (PF) 0.25% -1:200000 IJ SOLN
INTRAMUSCULAR | Status: AC
  Filled 2016-10-09: qty 30

## 2016-10-09 MED ORDER — MIDAZOLAM HCL 2 MG/2ML IJ SOLN
INTRAMUSCULAR | Status: AC
  Filled 2016-10-09: qty 2

## 2016-10-09 MED ORDER — GATIFLOXACIN 0.5 % OP SOLN OPTIME - NO CHARGE
OPHTHALMIC | Status: DC | PRN
  Administered 2016-10-09: 1 [drp] via OPHTHALMIC

## 2016-10-09 MED ORDER — CALCIUM CARBONATE-VITAMIN D 500-200 MG-UNIT PO TABS
1.0000 | ORAL_TABLET | Freq: Every day | ORAL | Status: DC
  Filled 2016-10-09: qty 1

## 2016-10-09 MED ORDER — ACETAZOLAMIDE SODIUM 500 MG IJ SOLR
INTRAMUSCULAR | Status: AC
  Filled 2016-10-09: qty 500

## 2016-10-09 MED ORDER — TETRACAINE HCL 0.5 % OP SOLN
OPHTHALMIC | Status: DC | PRN
  Administered 2016-10-09: 4 [drp] via OPHTHALMIC

## 2016-10-09 MED ORDER — MIDAZOLAM HCL 5 MG/5ML IJ SOLN
INTRAMUSCULAR | Status: DC | PRN
  Administered 2016-10-09: 0.5 mg via INTRAVENOUS

## 2016-10-09 MED ORDER — PROPOFOL 10 MG/ML IV BOLUS
INTRAVENOUS | Status: DC | PRN
  Administered 2016-10-09: 30 mg via INTRAVENOUS

## 2016-10-09 MED ORDER — PHENYLEPHRINE HCL 10 % OP SOLN
1.0000 [drp] | OPHTHALMIC | Status: AC | PRN
  Administered 2016-10-09 (×3): 1 [drp] via OPHTHALMIC
  Filled 2016-10-09: qty 5

## 2016-10-09 MED ORDER — TOBRAMYCIN-DEXAMETHASONE 0.3-0.1 % OP OINT
TOPICAL_OINTMENT | OPHTHALMIC | Status: DC
  Filled 2016-10-09: qty 3.5

## 2016-10-09 MED ORDER — BSS IO SOLN
INTRAOCULAR | Status: AC
  Filled 2016-10-09: qty 15

## 2016-10-09 MED ORDER — ACETAMINOPHEN 325 MG PO TABS: 325.0000 mg | ORAL_TABLET | ORAL | Status: DC | PRN

## 2016-10-09 MED ORDER — DEXAMETHASONE SODIUM PHOSPHATE 10 MG/ML IJ SOLN
INTRAMUSCULAR | Status: AC
  Filled 2016-10-09: qty 1

## 2016-10-09 MED ORDER — BSS PLUS IO SOLN
INTRAOCULAR | Status: DC | PRN
Start: 2016-10-09 — End: 2016-10-09
  Administered 2016-10-09: 1 via INTRAOCULAR

## 2016-10-09 MED ORDER — POLYMYXIN B SULFATE 500000 UNITS IJ SOLR
INTRAMUSCULAR | Status: AC
  Filled 2016-10-09: qty 500000

## 2016-10-09 MED ORDER — GLUCOSAMINE-CHONDROITIN 500-400 MG PO CAPS: ORAL_CAPSULE | Freq: Two times a day (BID) | ORAL | Status: DC

## 2016-10-09 MED ORDER — GATIFLOXACIN 0.5 % OP SOLN
1.0000 [drp] | OPHTHALMIC | Status: AC | PRN
  Administered 2016-10-09 (×3): 1 [drp] via OPHTHALMIC
  Filled 2016-10-09: qty 2.5

## 2016-10-09 MED ORDER — 0.9 % SODIUM CHLORIDE (POUR BTL) OPTIME
TOPICAL | Status: DC | PRN
  Administered 2016-10-09: 1000 mL

## 2016-10-09 MED ORDER — BRIMONIDINE TARTRATE 0.15 % OP SOLN
1.0000 [drp] | OPHTHALMIC | Status: DC
  Filled 2016-10-09: qty 5

## 2016-10-09 MED ORDER — METOPROLOL TARTRATE 12.5 MG HALF TABLET
12.5000 mg | ORAL_TABLET | Freq: Two times a day (BID) | ORAL | Status: DC
  Administered 2016-10-09: 12.5 mg via ORAL
  Filled 2016-10-09 (×2): qty 1

## 2016-10-09 SURGICAL SUPPLY — 93 items
APL SRG 3 HI ABS STRL LF PLS (MISCELLANEOUS)
APPLICATOR COTTON TIP 6IN STRL (MISCELLANEOUS) ×12 IMPLANT
APPLICATOR DR MATTHEWS STRL (MISCELLANEOUS) ×8 IMPLANT
BALL CTTN LRG ABS STRL LF (GAUZE/BANDAGES/DRESSINGS)
BANDAGE EYE OVAL (MISCELLANEOUS) ×2 IMPLANT
BLADE EYE CATARACT 19 1.4 BEAV (BLADE) IMPLANT
BLADE MVR KNIFE 19G (BLADE) IMPLANT
BLADE MVR KNIFE 20G (BLADE) ×3 IMPLANT
CANNULA ANT CHAM MAIN (OPHTHALMIC RELATED) IMPLANT
CANNULA DUAL BORE 23G (CANNULA) IMPLANT
CANNULA TROCAR 23 GA VLV (OPHTHALMIC) IMPLANT
CANNULA TROCAR 23G VLV (OPHTHALMIC) IMPLANT
CANNULA VLV SOFT TIP 25G (OPHTHALMIC) ×1 IMPLANT
CANNULA VLV SOFT TIP 25GA (OPHTHALMIC) ×3 IMPLANT
CLOSURE STERI-STRIP 1/2X4 (GAUZE/BANDAGES/DRESSINGS) ×1
CLSR STERI-STRIP ANTIMIC 1/2X4 (GAUZE/BANDAGES/DRESSINGS) ×2 IMPLANT
CORD BIPOLAR FORCEPS 12FT (ELECTRODE) IMPLANT
COTTONBALL LRG STERILE PKG (GAUZE/BANDAGES/DRESSINGS) ×3 IMPLANT
COVER SURGICAL LIGHT HANDLE (MISCELLANEOUS) ×3 IMPLANT
DRAPE MICROSCOPE LEICA 46X105 (MISCELLANEOUS) ×3 IMPLANT
DRAPE OPHTHALMIC 77X100 STRL (CUSTOM PROCEDURE TRAY) ×3 IMPLANT
ERASER HMR WETFIELD 23G BP (MISCELLANEOUS) IMPLANT
FILTER BLUE MILLIPORE (MISCELLANEOUS) IMPLANT
FILTER STRAW FLUID ASPIR (MISCELLANEOUS) IMPLANT
FORCEPS GRIESHABER ILM 25G A (INSTRUMENTS) IMPLANT
GAS AUTO FILL CONSTEL (OPHTHALMIC)
GAS AUTO FILL CONSTELLATION (OPHTHALMIC) IMPLANT
GLOVE BIO SURGEON STRL SZ 6.5 (GLOVE) ×2 IMPLANT
GLOVE BIO SURGEONS STRL SZ 6.5 (GLOVE) ×2
GLOVE BIOGEL M 7.0 STRL (GLOVE) ×2 IMPLANT
GLOVE BIOGEL M STRL SZ7.5 (GLOVE) ×3 IMPLANT
GLOVE ECLIPSE 7.5 STRL STRAW (GLOVE) ×2 IMPLANT
GLOVE SS BIOGEL STRL SZ 7.5 (GLOVE) IMPLANT
GLOVE SUPERSENSE BIOGEL SZ 7.5 (GLOVE) ×2
GOWN STRL REUS W/ TWL LRG LVL3 (GOWN DISPOSABLE) ×2 IMPLANT
GOWN STRL REUS W/TWL LRG LVL3 (GOWN DISPOSABLE) ×9
HANDLE PNEUMATIC FOR CONSTEL (OPHTHALMIC) IMPLANT
KIT BASIN OR (CUSTOM PROCEDURE TRAY) ×3 IMPLANT
KIT PERFLUORON PROCEDURE 5ML (MISCELLANEOUS) IMPLANT
KNIFE CRESCENT 1.75 EDGEAHEAD (BLADE) IMPLANT
KNIFE GRIESHABER SHARP 2.5MM (MISCELLANEOUS) ×6 IMPLANT
LENS BIOM SUPER VIEW SET DISP (OPHTHALMIC RELATED) IMPLANT
MICROPICK 25G (MISCELLANEOUS)
NDL 18GX1X1/2 (RX/OR ONLY) (NEEDLE) ×1 IMPLANT
NDL 25GX 5/8IN NON SAFETY (NEEDLE) IMPLANT
NDL FILTER BLUNT 18X1 1/2 (NEEDLE) IMPLANT
NDL HYPO 30X.5 LL (NEEDLE) ×2 IMPLANT
NDL PRECISIONGLIDE 27X1.5 (NEEDLE) IMPLANT
NEEDLE 18GX1X1/2 (RX/OR ONLY) (NEEDLE) ×9 IMPLANT
NEEDLE 25GX 5/8IN NON SAFETY (NEEDLE) IMPLANT
NEEDLE FILTER BLUNT 18X 1/2SAF (NEEDLE) ×4
NEEDLE FILTER BLUNT 18X1 1/2 (NEEDLE) ×2 IMPLANT
NEEDLE HYPO 30X.5 LL (NEEDLE) ×6 IMPLANT
NEEDLE PRECISIONGLIDE 27X1.5 (NEEDLE) IMPLANT
NS IRRIG 1000ML POUR BTL (IV SOLUTION) ×3 IMPLANT
PACK VITRECTOMY CUSTOM (CUSTOM PROCEDURE TRAY) ×3 IMPLANT
PAD ARMBOARD 7.5X6 YLW CONV (MISCELLANEOUS) ×6 IMPLANT
PAK PIK VITRECTOMY CVS 25GA (OPHTHALMIC) ×3 IMPLANT
PENCIL BIPOLAR 25GA STR DISP (OPHTHALMIC RELATED) ×3 IMPLANT
PIC ILLUMINATED 25G (OPHTHALMIC) ×3
PICK MICROPICK 25G (MISCELLANEOUS) IMPLANT
PIK ILLUMINATED 25G (OPHTHALMIC) ×1 IMPLANT
PROBE DIATHERMY DSP 27GA (MISCELLANEOUS) IMPLANT
PROBE ENDO DIATHERMY 25G (MISCELLANEOUS) IMPLANT
PROBE LASER ILLUM FLEX CVD 25G (OPHTHALMIC) IMPLANT
REPL STRA BRUSH NDL (NEEDLE) IMPLANT
REPL STRA BRUSH NEEDLE (NEEDLE) IMPLANT
RESERVOIR BACK FLUSH (MISCELLANEOUS) IMPLANT
RETRACTOR IRIS FLEX 25G GRIESH (INSTRUMENTS) IMPLANT
ROLLS DENTAL (MISCELLANEOUS) ×6 IMPLANT
SCRAPER DIAMOND 25GA (OPHTHALMIC RELATED) IMPLANT
SPONGE SURGIFOAM ABS GEL 12-7 (HEMOSTASIS) IMPLANT
STOPCOCK 4 WAY LG BORE MALE ST (IV SETS) IMPLANT
SUT CHROMIC 7 0 TG140 8 (SUTURE) IMPLANT
SUT ETHILON 10 0 BV100 4 (SUTURE) IMPLANT
SUT ETHILON 5.0 S-24 (SUTURE) ×1 IMPLANT
SUT ETHILON 9 0 TG140 8 (SUTURE) IMPLANT
SUT MERSILENE 4 0 RV 2 (SUTURE) ×2 IMPLANT
SUT SILK 2 0 (SUTURE)
SUT SILK 2 0 TIES 17X18 (SUTURE)
SUT SILK 2-0 18XBRD TIE 12 (SUTURE) ×1 IMPLANT
SUT SILK 2-0 18XBRD TIE BLK (SUTURE) ×1 IMPLANT
SUT SILK 4 0 RB 1 (SUTURE) ×1 IMPLANT
SUT VICRYL 7 0 TG140 8 (SUTURE) ×3 IMPLANT
SYR 10ML LL (SYRINGE) ×3 IMPLANT
SYR 20CC LL (SYRINGE) ×5 IMPLANT
SYR 5ML LL (SYRINGE) ×3 IMPLANT
SYR BULB 3OZ (MISCELLANEOUS) ×3 IMPLANT
SYR TB 1ML LUER SLIP (SYRINGE) IMPLANT
TOWEL OR 17X26 4PK STRL BLUE (TOWEL DISPOSABLE) IMPLANT
TUBING HIGH PRESS EXTEN 6IN (TUBING) ×3 IMPLANT
WATER STERILE IRR 1000ML POUR (IV SOLUTION) ×3 IMPLANT
WIPE INSTRUMENT VISIWIPE 73X73 (MISCELLANEOUS) IMPLANT

## 2016-10-09 NOTE — Anesthesia Preprocedure Evaluation (Addendum)
Anesthesia Evaluation  Patient identified by MRN, date of birth, ID band Patient awake    Reviewed: Allergy & Precautions, NPO status , Patient's Chart, lab work & pertinent test results  Airway Mallampati: II  TM Distance: <3 FB Neck ROM: Full    Dental no notable dental hx. (+) Teeth Intact, Dental Advisory Given   Pulmonary    breath sounds clear to auscultation       Cardiovascular hypertension, Pt. on medications + dysrhythmias  Rhythm:Regular Rate:Normal + Systolic murmurs    Neuro/Psych  Headaches,    GI/Hepatic negative GI ROS, Neg liver ROS,   Endo/Other  negative endocrine ROS  Renal/GU negative Renal ROS     Musculoskeletal   Abdominal   Peds  Hematology negative hematology ROS (+)   Anesthesia Other Findings   Reproductive/Obstetrics                            Anesthesia Physical Anesthesia Plan  ASA: III  Anesthesia Plan: MAC   Post-op Pain Management:    Induction: Intravenous  PONV Risk Score and Plan: 2 and Ondansetron and Dexamethasone  Airway Management Planned: Natural Airway and Simple Face Mask  Additional Equipment:   Intra-op Plan:   Post-operative Plan: Extubation in OR  Informed Consent: I have reviewed the patients History and Physical, chart, labs and discussed the procedure including the risks, benefits and alternatives for the proposed anesthesia with the patient or authorized representative who has indicated his/her understanding and acceptance.   Dental advisory given  Plan Discussed with:   Anesthesia Plan Comments:         Anesthesia Quick Evaluation

## 2016-10-09 NOTE — Progress Notes (Signed)
Dr Orene Desanctis informed of vs and states no need to give Lopressor.

## 2016-10-09 NOTE — Interval H&P Note (Signed)
History and Physical Interval Note:  10/09/2016 8:35 AM  Mallory Jensen  has presented today for surgery, with the diagnosis of MACULAR HOLE OF LEFT EYE  The various methods of treatment have been discussed with the patient and family. After consideration of risks, benefits and other options for treatment, the patient has consented to  Procedure(s): PARS PLANA VITRECTOMY WITH 25 GAUGE (Left) as a surgical intervention .  The patient's history has been reviewed, patient examined, no change in status, stable for surgery.  I have reviewed the patient's chart and labs.  Questions were answered to the patient's satisfaction.     Mallory Jensen

## 2016-10-09 NOTE — Brief Op Note (Signed)
10/09/2016  12:21 PM  PATIENT:  Mallory Jensen  83 y.o. female  PRE-OPERATIVE DIAGNOSIS:  MACULAR HOLE OF LEFT EYE  POST-OPERATIVE DIAGNOSIS:  MACULAR HOLE OF LEFT EYE  PROCEDURE:  Procedure(s): PARS PLANA VITRECTOMY WITH 25 GAUGE (Left)  SURGEON:  Surgeon(s) and Role:    Bernarda Caffey, MD - Primary  ANESTHESIA:   regional and MAC  EBL:  Total I/O In: 600 [I.V.:600] Out: 2 [Blood:2]  BLOOD ADMINISTERED:none  DRAINS: none   LOCAL MEDICATIONS USED:  BUPIVICAINE  and LIDOCAINE   SPECIMEN:  No Specimen  DISPOSITION OF SPECIMEN:  N/A  COUNTS:  YES  TOURNIQUET:  * No tourniquets in log *  DICTATION: .Note written in EPIC  PLAN OF CARE: Admit for overnight observation  PATIENT DISPOSITION:  PACU - hemodynamically stable.   Delay start of Pharmacological VTE agent (>24hrs) due to surgical blood loss or risk of bleeding: not applicable

## 2016-10-09 NOTE — Anesthesia Procedure Notes (Signed)
Procedure Name: MAC Date/Time: 10/09/2016 9:58 AM Performed by: Jenne Campus Pre-anesthesia Checklist: Patient identified, Emergency Drugs available, Suction available, Patient being monitored and Timeout performed Oxygen Delivery Method: Nasal cannula

## 2016-10-09 NOTE — Progress Notes (Signed)
Received pt per chair accompanied by PACU nurse. Pt with eye patch to the left eye with mild blood saturation to inner side. Pt instructed to face downwards as much as possible. Pt ambulated to the bathroom, independent to hygiene. Repositioned pt in bed lying to her right side as she refused to lie face down. Educated pt. Safety ensured.

## 2016-10-09 NOTE — Progress Notes (Signed)
Spoke with 6 N RN and according to them no eye drops to be admilnistered until MD comes in am

## 2016-10-09 NOTE — Progress Notes (Signed)
Dr Orene Desanctis Called about Bp meds will discuss with him later.

## 2016-10-09 NOTE — Op Note (Addendum)
Date of procedure:  10/09/16  Surgeon: Bernarda Caffey, MD, PhD  Pre-operative Diagnosis: Macular hole, Left Eye  Post-operative diagnosis: Macular hole, Left Eye Retinal break, Left eye  Anesthesia: MAC with Retrobulbar block   Procedure:   1. 25 gauge pars plana vitrectomy, LeftEye 2. Indocyanine green stain, Left Eye 3. Internal Limiting Membrane peel Left eye  4. Injection 12% C3F8, Left eye 5. Endolaser, Left eye  Indications for procedure: The patient presented with a full thickness macular hole and complaint of central visual loss consistent with a scotoma.  After discussing the risks, benefits, and alternatives to surgery, the patient electively decided to undergo surgical repair and informed consent was obtained.  The surgery was an attempt to close the macular hole and potentially improve the vision within the reasonable expectations of the surgeon.   Procedure in Detail:   The patient was met in the pre-operative holding area where their identification data was verified.  It was noted that there was a signed, informed consent in the chart and the Left Eye eye was verbally verified by the patient as the operative eye and was marked with a marking pen. The patient was then taken to the operating room and placed in the supine position.   After being connected to the appropriate monitoring devices, a time out was performed and the patient was sedated by anesthesia personnel prior to administration of the block.  The block consisted of a 50-50 mix of 2% lidocaine and 0.75% marcaine, ~7 mL were injected using a retrobulbar technique. The patient tolerated this well, and an adequate block was attained without complications. The eye was then prepped with 5% betadine and draped in the normal fashion for ophthalmic surgery.  The microscope was draped and swung into position, and a second timeout was performed prior to initiation of the operation.    A 25 gauge trocar was inserted in a 30-45  degrees fashion into the inferotemporal quadrant 3.5 mm posterior to the limbus in this pseudophakic patient.  Correct positioning within the vitreous was verified externally with the light pipe.  The infusion was then connected to the cannula and BSS infusion was commenced.  Additional ports were placed in the superonasal and superotemporal quadrants. Healon was placed on the cornea and direct vitrectomy w/ posterior capsulectomy of PCO was performed. The BIOM was used to visualize the posterior segment while the core vitrectomy was completed.  The patient had a visible full thickness macular hole and attached posterior hyaloid.  A posterior vitreous detachment was created using suction over the optic nerve head and lifting anteriorly, and the remaining vitreous was removed. Kenalog was used to aid in this process. A thorough peripheral vitrectomy was performed.    Pre-diluted indocyanine green was then used to stain the internal limiting membrane. A contact lens was placed on the eye. End grasping forceps were used to create an opening in the ILM and the ILM was peeled fully from the macula taking care to avoid traction on the macular hole.    The wide angle viewing system was brought back into position. Scleral depression was performed. A small horseshoe tear was noted at 12 oclock. Endolaser was used to surround this area with three rows of laser spots plus additional anterior laser. No other breaks were noted. An air fluid exchange was performed.   The superonasal port was then removed and sutured with 7-0 vicryl, there was no leakage.  12% C3F8 gas was connected to the infusion line and gas  was injected into the posterior segment while venting air through the adjacent trocar using the extrusion cannula. Once a full, 40cc of gas was vented through the eye, the infusion port and venting ports were removed and they were sutured with 7-0 vicryl.  There was no leakage from the sclerotomy  sites.  Subconjunctival injections of decadron and kefzol + bacitracin + polymixin b were then administered, and antibiotic and steroid drops as well as antibiotic ointment were placed in the eye.  The drapes were removed and the eye was patched and shielded.  A green gas bracelet was placed on the patient's wrist. The patient was then taken to the post-operative area for recovery having tolerated the procedure well.  She was instructed to perform face down positioning postoperatively.  Estimate blood lost: none Complications: None

## 2016-10-09 NOTE — Progress Notes (Signed)
Py refusing to lie face down, pt will lie with left eye down

## 2016-10-09 NOTE — Transfer of Care (Signed)
Immediate Anesthesia Transfer of Care Note  Patient: Mallory Jensen  Procedure(s) Performed: Procedure(s): PARS PLANA VITRECTOMY WITH 25 GAUGE (Left)  Patient Location: PACU  Anesthesia Type:MAC  Level of Consciousness: awake, alert , oriented and patient cooperative  Airway & Oxygen Therapy: Patient Spontanous Breathing and Patient connected to nasal cannula oxygen  Post-op Assessment: Report given to RN and Post -op Vital signs reviewed and stable  Post vital signs: Reviewed  Last Vitals:  Vitals:   10/09/16 0754 10/09/16 0808  BP:  124/70  Pulse: 62   Resp: 16   Temp: 36.6 C   SpO2: 99%     Last Pain:  Vitals:   10/09/16 0754  TempSrc: Oral      Patients Stated Pain Goal: 2 (46/95/07 2257)  Complications: No apparent anesthesia complications

## 2016-10-10 ENCOUNTER — Encounter (HOSPITAL_COMMUNITY): Payer: Self-pay | Admitting: Ophthalmology

## 2016-10-10 DIAGNOSIS — I350 Nonrheumatic aortic (valve) stenosis: Secondary | ICD-10-CM | POA: Diagnosis not present

## 2016-10-10 DIAGNOSIS — I4510 Unspecified right bundle-branch block: Secondary | ICD-10-CM | POA: Diagnosis not present

## 2016-10-10 DIAGNOSIS — E7800 Pure hypercholesterolemia, unspecified: Secondary | ICD-10-CM | POA: Diagnosis not present

## 2016-10-10 DIAGNOSIS — R5382 Chronic fatigue, unspecified: Secondary | ICD-10-CM | POA: Diagnosis not present

## 2016-10-10 DIAGNOSIS — H35342 Macular cyst, hole, or pseudohole, left eye: Secondary | ICD-10-CM | POA: Diagnosis not present

## 2016-10-10 DIAGNOSIS — I10 Essential (primary) hypertension: Secondary | ICD-10-CM | POA: Diagnosis not present

## 2016-10-10 NOTE — Progress Notes (Signed)
Pt refusing to wear [facility] tonight. Educated pt.

## 2016-10-10 NOTE — Progress Notes (Signed)
Discharge to home, daughter at bedside. Discharge instructions was discussed and given  by MD with the patient and daughter. PIV removed, no signs of swelling noted.PredForte given to pt per MD order.

## 2016-10-10 NOTE — Anesthesia Postprocedure Evaluation (Signed)
Anesthesia Post Note  Patient: Mallory Jensen  Procedure(s) Performed: Procedure(s) (LRB): PARS PLANA VITRECTOMY WITH 25 GAUGE (Left)     Patient location during evaluation: PACU Anesthesia Type: MAC Level of consciousness: awake and alert Pain management: pain level controlled Vital Signs Assessment: post-procedure vital signs reviewed and stable Respiratory status: spontaneous breathing, nonlabored ventilation, respiratory function stable and patient connected to nasal cannula oxygen Cardiovascular status: stable and blood pressure returned to baseline Anesthetic complications: no    Last Vitals:  Vitals:   10/09/16 2125 10/10/16 0652  BP: (!) 140/42 134/71  Pulse: 94 84  Resp: 17 17  Temp: 36.8 C 36.9 C  SpO2: 97% 97%    Last Pain:  Vitals:   10/09/16 2125  TempSrc: Oral                 Elease Swarm,JAMES TERRILL

## 2016-10-10 NOTE — Progress Notes (Signed)
OPHTHALMOLOGY PROGRESS / POST-OP NOTE   HPI:  POD1 s/p PPV/ICGstain/ILM peel/EL/FAX/12% C3F8 OS, 8.30.18 for full thickness macular hole OS.  Did well overnight. Minimal pain. Questionable face down positioning as unable to lay on belly.  OHx: pseudophakia OU; macular hole OD s/p repair ~2015  ORx: none    Past Medical History:  Diagnosis Date  . Acute pancreatitis January 2014  . Chronic back pain   . Chronic fatigue   . Compression fracture of vertebral column ([place])   . Headache    PMH  . HTN (hypertension)   . Hypercholesteremia   . Macular hole   . Mild aortic stenosis   . MRSA (methicillin resistant Staphylococcus aureus)   . Osteoporosis   . Ovarian cyst   . RBBB   . Staph infection     family history includes Cancer in her unknown relative; Cataracts in her mother; Heart disease in her brother, brother, and unknown relative.  Social History   Occupational History  . catering    Social History Main Topics  . Smoking status: Never Smoker  . Smokeless tobacco: Never Used  . Alcohol use Yes     Comment: glass of wine infrequently  . Drug use: No  . Sexual activity: No    Allergies  Allergen Reactions  . Penicillins Rash and Other (See Comments)    PATIENT HAS HAD A PCN REACTION WITH IMMEDIATE RASH, FACIAL/TONGUE/THROAT SWELLING, SOB, OR LIGHTHEADEDNESS WITH HYPOTENSION:  #  #  #  YES  #  #  #   Has patient had a PCN reaction causing severe rash involving mucus membranes or skin necrosis: No Has patient had a PCN reaction that required hospitalization: No Has patient had a PCN reaction occurring within the last 10 years: No If all of the above answers are "NO", then may proceed with Cephalosporin use.   . Codeine Nausea Only  . Tramadol Hcl Other (See Comments)    "Made me crazy"---caused mood changes    EXAM  Mental Status: A&O x3  Base Exam  OD    OS VA        CF 2' Pupils   Reactive   Pharm dilated IOP (tonopen)  21 mmHg   26  mmHg Motility   Full    full External  nml    nml  Anterior Exam OD    OS Lids/Lashes  nml    nml Conj/Sclera  Sutures intact; [place]; injxn Sutures intact; [place]; injection Cornea  Clear    clear Ant Chamber  Deep    deep [place]   nml    Round; dilated   Lens   PCIOL    PCIOL   Posterior Exam  OD  OS   Vitreous    post vitrectomy; gas fill  Disc   Pink; sharp  Macula   Flat under gas; hole closing; residual kenalog in hole  Vessels   normal  Periphery   Flat; attached     Assessment/Plan:  1. Macular Hole OS  POD1 s/p PPV/ICGstain/ILM peel/EL/FAX/12% C3F8 OS, 8.30.18  - hazy view from gas fill but attached   - IOP mildly elevated  - start  PF 4x/day OD   gatiflox QID OD    Atropine BID OD    Brimonidine BID OD   Cosopt BID OD   PSO ung QID OD  - cont face down positioning x3 days; avoid laying flat on back   - eye shield when sleeping   - post  op drop and positioning instructions reviewed   - tylenol/ibuprofen for pain   Dispo: OK for discharge   Gardiner Sleeper, M.D., Ph.D. Vitreoretinal Surgeon [facility]

## 2016-10-10 NOTE — Discharge Instructions (Addendum)
°  Post-operative care        Retina and Vitreous surgery  Care of your eye:  Please do not rub your eye, you may damage the eye.  Wash your hands and use clean tissues each time you blot the eye  You may take Extra Strength Tylenol if needed for discomfort.  Call for an appointment if the pain becomes severe and is not improved with the medications given on discharge from [facility].  It is normal for the eye to be red, swollen and bloodshot.  You will have red-tinged tears.  Activities: * FACE DOWN POSITIONING UNTIL Sunday -- recommend 50 minutes of every 60 minutes with eyes positioned downward * Bed rest or moderate activity is recommended for the first week. * Do not strain yourself or lift more than ten pounds.   * You May: Watch television, eat in a restaurant and ride in a car  Bathing:   *Do not get your eye wet for 4 days.  You may bathe without getting the eye wet.  You may wash your hair by lying backward or forward at the sink.  Gas bubble: If you have a gas bubble you may not lie on your back.  You must not fly in an airplane as long as the bubble is in your eye. You will see the bubble as a dark circle in the lower part of your vision.  It moves when you tilt your head.  Maintain the following positions:  ? Face Down ? Right Side Down ? Left Side Down ? Gaze Down ? Sitting up  ? Any position  Post Operative Appointment: Please call my office at [phone] for a one-week follow up appointment if one was not given to you before surgery.  Dressing:  Please wear the hard shield only at night for three weeks.  You may include the pad if needed  MEDICATION INSTRUCTIONS  Place the following drops in the operative eye: ____Alphagan (Brimonidine)  2 times daily ____Zymaxid    4 times daily ____Prednisolone 1%   4 times daily ____Polysporin Ophth Ointment 3 times daily ____Atropine 1%   2 times daily ____Cosopt    2 times daily  Oral Mediactions: ____Prednisone _____mg each  morning with 15cc Mylanta  *Please wait 5 minutes between eye drops if they are scheduled at the same time *Please continue all eye drops in the non-operative eye *Resume all of your other medications as before your surgery

## 2016-10-15 NOTE — Progress Notes (Signed)
[facility] Note  10/16/2016     CHIEF COMPLAINT Patient presents for Post-op Follow-up   HISTORY OF PRESENT ILLNESS: Mallory Jensen is a 83 y.o. female who presents to the clinic today for:   HPI    Post-op Follow-up  In left eye.  Discomfort includes tearing and floaters.  Negative for pain, itching, foreign body sensation, discharge and none.  Vision is stable.  I, the attending physician,  performed the HPI with the patient and updated documentation appropriately.        Comments  S/P 25g PPV/ICG staining/MP/Gas OS (08.29.2018);  Pt states OS VA is stable; pt reports that she continues to have floaters; Pt denies flashes, denies ocular pain; pt reports using zymaxid OS QID; alphagan OS QID; cosopt OS BID; atropine OS BID; PF OS QID; polysporin ung OS QID;      Last edited by Bernarda Caffey, MD on 10/16/2016  9:29 AM. (History)      Referring physician: Luberta Mutter, MD [place], [place] [address]  HISTORICAL INFORMATION:   Selected notes from the MEDICAL RECORD NUMBER Referral from Dr. Ellie Lunch for mac hole OS  Past ocular history of macular hole OD s/p repair in 2015 w/ [facility]     CURRENT MEDICATIONS: Current Outpatient Prescriptions (Ophthalmic Drugs)  Medication Sig  . Polyethyl Glycol-Propyl Glycol (SYSTANE ULTRA) 0.4-0.3 % SOLN Place 1-2 drops into both eyes 4 (four) times daily as needed (for dry eyes).  . prednisoLONE acetate (PRED FORTE) 1 % ophthalmic suspension Place 1 drop into the left eye 4 (four) times daily.   No current facility-administered medications for this visit.  (Ophthalmic Drugs)   Current Outpatient Prescriptions (Other)  Medication Sig  . amLODipine (NORVASC) 5 MG tablet Take 1 tablet (5 mg total) by mouth daily.  Marland Kitchen aspirin EC 81 MG tablet Take 1 tablet (81 mg total) by mouth daily.  . calcium-vitamin D (OSCAL WITH D) 500-200 MG-UNIT per tablet Take 1 tablet by mouth daily.  . Cholecalciferol  (VITAMIN D3) 2000 units TABS Take 2,000 Units by mouth daily.  Marland Kitchen GINKGO BILOBA PO Take 1 tablet by mouth daily.  . Glucosamine-Chondroitin (COSAMIN DS PO) Take 1 tablet by mouth 2 (two) times daily.  . iron polysaccharides (FERREX 150) 150 MG capsule Take 150 mg by mouth daily.  Marland Kitchen losartan (COZAAR) 100 MG tablet Take 1 tablet (100 mg total) by mouth daily.  . metoprolol tartrate (LOPRESSOR) 25 MG tablet Take 12.5 mg by mouth 2 (two) times daily.  . Multiple Vitamin (MULTIVITAMIN WITH MINERALS) TABS Take 1 tablet by mouth daily. Centrum Silver  . naproxen sodium (ANAPROX) 220 MG tablet Take 220 mg by mouth 2 (two) times daily as needed (for pain.).  Marland Kitchen pantoprazole (PROTONIX) 40 MG tablet Take 1 tablet (40 mg total) by mouth daily.   No current facility-administered medications for this visit.  (Other)      REVIEW OF SYSTEMS: ROS    Positive for: Musculoskeletal, Eyes   Negative for: Constitutional, Gastrointestinal, Neurological, Skin, Genitourinary, HENT, Endocrine, Cardiovascular, Respiratory, Psychiatric, Allergic/Imm, Heme/Lymph   Last edited by [name] on 10/16/2016  9:08 AM. (History)       ALLERGIES Allergies  Allergen Reactions  . Penicillins Rash and Other (See Comments)    PATIENT HAS HAD A PCN REACTION WITH IMMEDIATE RASH, FACIAL/TONGUE/THROAT SWELLING, SOB, OR LIGHTHEADEDNESS WITH HYPOTENSION:  #  #  #  YES  #  #  #   Has patient  had a PCN reaction causing severe rash involving mucus membranes or skin necrosis: No Has patient had a PCN reaction that required hospitalization: No Has patient had a PCN reaction occurring within the last 10 years: No If all of the above answers are "NO", then may proceed with Cephalosporin use.   . Codeine Nausea Only  . Tramadol Hcl Other (See Comments)    "Made me crazy"---caused mood changes    PAST MEDICAL HISTORY Past Medical History:  Diagnosis Date  . Acute pancreatitis January 2014  . Chronic back pain   . Chronic  fatigue   . Compression fracture of vertebral column ([place])   . Headache    PMH  . HTN (hypertension)   . Hypercholesteremia   . Macular hole   . Mild aortic stenosis   . MRSA (methicillin resistant Staphylococcus aureus)   . Osteoporosis   . Ovarian cyst   . RBBB   . Staph infection    Past Surgical History:  Procedure Laterality Date  . ABDOMINAL HYSTERECTOMY    . APPENDECTOMY    . athroscopy Right shoulder    . Biopsy Left Breast x 2    . CESAREAN SECTION    . CHOLECYSTECTOMY    . PARS PLANA VITRECTOMY Left 10/09/2016   Procedure: PARS PLANA VITRECTOMY WITH 25 GAUGE;  Surgeon: Bernarda Caffey, MD;  Location: [facility];  Service: Ophthalmology;  Laterality: Left;  . PARS PLANA VITRECTOMY W/ REPAIR OF MACULAR HOLE Right 2015    FAMILY HISTORY Family History  Problem Relation Age of Onset  . Cataracts Mother   . Heart disease Brother   . Heart disease Brother   . Cancer Unknown   . Heart disease Unknown   . Amblyopia Neg Hx   . Blindness Neg Hx   . Glaucoma Neg Hx   . Macular degeneration Neg Hx   . Retinal detachment Neg Hx   . Strabismus Neg Hx   . Retinitis pigmentosa Neg Hx     SOCIAL HISTORY Social History  Substance Use Topics  . Smoking status: Never Smoker  . Smokeless tobacco: Never Used  . Alcohol use Yes     Comment: glass of wine infrequently         OPHTHALMIC EXAM:  Base Eye Exam    Visual Acuity (Snellen - Linear)      Right Left   Dist [place] 20/40 CF at face   Dist ph  NI NI       Tonometry (Applanation, 9:17 AM)      Right Left   Pressure 17 19       Pupils      Dark Light Shape React APD   Right 3 2 Round Brisk None   Left 5  Round Minimal None       Neuro/Psych    Oriented x3:  Yes   Mood/Affect:  Normal       Dilation    Left eye:  1.0% Mydriacyl, 2.5% Phenylephrine @ 9:17 AM        Slit Lamp and Fundus Exam    External Exam      Right Left   External Normal green gas bracelet on left wrist       Slit Lamp Exam       Right Left   Lids/Lashes Dermatochalasis - upper lid Dermatochalasis - upper lid   Conjunctiva/Sclera White and quiet sutures intact; tr injection; no [facility]   Cornea Clear Clear   Anterior Chamber Deep and  quiet Deep and quiet   Iris Round Round; dilated   Lens Posterior chamber intraocular lens, Posterior capsular opacification - trace Posterior chamber intraocular lens, open PC   Vitreous Post Vitrectomy  post vitrectomy; 85% gas fill       Fundus Exam      Right Left   Disc Normal Mild PPA   C/D Ratio 0.3 0.25   Macula  Macular hole closing; mild RPE changes; flat under gas   Vessels  Vascular attenuation - mild   Periphery  Flat, attached, early laser changes surrounding small tear 1200; reticular degeneration          IMAGING AND PROCEDURES  Imaging and Procedures for 10/16/16  OCT, Retina - OU - Both Eyes     Right Eye Central Foveal Thickness: 229. Progression has been stable. Findings include abnormal foveal contour.   Left Eye Progression has improved. Findings include (Hazy view due to gas; Mac hole closing; central ORA; central hyperreflectivity).   Notes Images taken, stored on drive   Diagnosis / Impression:  OD - closed macular hole OS - macular hole closing; perifoveal IRF/edema improved  Clinical management:  See below  Abbreviations: NFP - Normal foveal profile. CME - cystoid macular edema. PED - pigment epithelial detachment. IRF - intraretinal fluid. SRF - subretinal fluid. EZ - ellipsoid zone. ERM - epiretinal membrane. ORA - outer retinal atrophy. ORT - outer retinal tubulation. SRHM - subretinal hyper-reflective material                ASSESSMENT/PLAN:    ICD-10-CM   1. Macular hole of left eye s/p PPV/MP/12%C3F8 OS, 8.29.18 H35.342 OCT, Retina - OU - Both Eyes  2. Macular hole, right H35.341   3. Pseudophakia of both eyes Z96.1     1. Full-thickness macular hole, left eye.   - POW1 s/p PPV/ICG/MP/EL/FAX/12% C3F8 OS, 8.29.18              - did well this week             - macular hole closing; retina in good position             - IOP okay today             - cont   PF 4x/day OS   zymaxid QID OS-- Stop on Saturday                         Alphagan P BID OS   Polysporin ung OD-- decrease to bedtime + PRN  - stop Atropine BID OS                        Cosopt BID OD  - dc face down positioning after POD3; avoid laying flat on back             - eye shield when sleepingx1 more week  - green bracelet in place on left wrist -- continue             - post op drop and positioning instructions reviewed             - tylenol/ibuprofen for pain  2. History of macular hole, right eye - s/p mac hole repair in 2015 -- Dr. Starling Manns - hole closed, stable, VA 20/60 - monitor  3,4. Pseudophakia w/ mild PCO OU - PCIOL in good position - PCO not visually significant at this time - may  benefit from YAG capsulotomy w/ Dr. Ellie Lunch in the future   Ophthalmic Meds Ordered this visit:  Meds ordered this encounter  Medications  . prednisoLONE acetate (PRED FORTE) 1 % ophthalmic suspension    Sig: Place 1 drop into the left eye 4 (four) times daily.    Dispense:  15 mL    Refill:  0       Return in about 3 weeks (around 11/06/2016) for POV, Dilated Exam, OCT.  There are no Patient Instructions on file for this visit.   Explained the diagnoses, plan, and follow up with the patient and they expressed understanding.  Patient expressed understanding of the importance of proper follow up care.   Gardiner Sleeper, M.D., Ph.D. Vitreoretinal Surgeon [facility] 10/16/16     Abbreviations: M myopia (nearsighted); A astigmatism; H hyperopia (farsighted); P presbyopia; Mrx spectacle prescription;  CTL contact lenses; OD right eye; OS left eye; OU both eyes  XT exotropia; ET esotropia; PEK punctate epithelial keratitis; PEE punctate epithelial erosions; DES dry eye syndrome; MGD meibomian gland  dysfunction; ATs artificial tears; PFAT's preservative free artificial tears; [place] nuclear sclerotic cataract; PSC posterior subcapsular cataract; ERM epi-retinal membrane; PVD posterior vitreous detachment; RD retinal detachment; DM diabetes mellitus; DR diabetic retinopathy; NPDR non-proliferative diabetic retinopathy; PDR proliferative diabetic retinopathy; CSME clinically significant macular edema; DME diabetic macular edema; dbh dot blot hemorrhages; CWS cotton wool spot; POAG primary open angle glaucoma; C/D cup-to-disc ratio; HVF humphrey visual field; GVF goldmann visual field; OCT optical coherence tomography; IOP intraocular pressure; BRVO Branch retinal vein occlusion; CRVO central retinal vein occlusion; CRAO central retinal artery occlusion; BRAO branch retinal artery occlusion; RT retinal tear; SB scleral buckle; PPV pars plana vitrectomy; VH Vitreous hemorrhage; PRP panretinal laser photocoagulation; IVK intravitreal kenalog; VMT vitreomacular traction; MH Macular hole;  NVD neovascularization of the disc; NVE neovascularization elsewhere; AREDS age related eye disease study; ARMD age related macular degeneration; POAG primary open angle glaucoma; EBMD epithelial/anterior basement membrane dystrophy; ACIOL anterior chamber intraocular lens; IOL intraocular lens; PCIOL posterior chamber intraocular lens; Phaco/IOL phacoemulsification with intraocular lens placement; [place] photorefractive keratectomy; LASIK laser assisted in situ keratomileusis; HTN hypertension; DM diabetes mellitus; COPD chronic obstructive pulmonary disease

## 2016-10-16 ENCOUNTER — Encounter (INDEPENDENT_AMBULATORY_CARE_PROVIDER_SITE_OTHER): Payer: Self-pay | Admitting: Ophthalmology

## 2016-10-16 ENCOUNTER — Ambulatory Visit (INDEPENDENT_AMBULATORY_CARE_PROVIDER_SITE_OTHER): Payer: Medicare Other | Admitting: Ophthalmology

## 2016-10-16 VITALS — BP 130/74

## 2016-10-16 DIAGNOSIS — H35342 Macular cyst, hole, or pseudohole, left eye: Secondary | ICD-10-CM | POA: Diagnosis not present

## 2016-10-16 DIAGNOSIS — H35341 Macular cyst, hole, or pseudohole, right eye: Secondary | ICD-10-CM

## 2016-10-16 DIAGNOSIS — Z961 Presence of intraocular lens: Secondary | ICD-10-CM

## 2016-10-16 MED ORDER — PREDNISOLONE ACETATE 1 % OP SUSP: 1.0000 [drp] | Freq: Four times a day (QID) | OPHTHALMIC | 0 refills | Status: AC

## 2016-11-04 NOTE — Progress Notes (Signed)
[facility] Note  11/06/2016     CHIEF COMPLAINT Patient presents for Post-op Follow-up   HISTORY OF PRESENT ILLNESS: Mallory Jensen is a 83 y.o. female who presents to the clinic today for:   HPI    Post-op Follow-up  In left eye.  Discomfort includes floaters.  Negative for pain, tearing, itching, discharge and foreign body sensation.  Vision is stable.  I, the attending physician,  performed the HPI with the patient and updated documentation appropriately.        Comments  S/P PPV/ICG/MP/EL/FAX/12% C3F8 OS (10/09/2016);  Pt states she continues to have trouble seeing due to bubble in OS; Pt reports having floaters OS, states that she is having wavy VA off and on; Pt denies flashes, denies ocular pain; Pt reports using gtts as directed;      Last edited by [name] on 11/06/2016  9:30 AM. (History)       Referring physician: Luberta Mutter, MD [place], [place] [address]  HISTORICAL INFORMATION:   Selected notes from the MEDICAL RECORD NUMBER Referral from Dr. Ellie Lunch for mac hole OS  Past ocular history of macular hole OD s/p repair in 2015 w/ [facility]     CURRENT MEDICATIONS: Current Outpatient Prescriptions (Ophthalmic Drugs)  Medication Sig  . Polyethyl Glycol-Propyl Glycol (SYSTANE ULTRA) 0.4-0.3 % SOLN Place 1-2 drops into both eyes 4 (four) times daily as needed (for dry eyes).  . prednisoLONE acetate (PRED FORTE) 1 % ophthalmic suspension Place 1 drop into the left eye 4 (four) times daily.   No current facility-administered medications for this visit.  (Ophthalmic Drugs)   Current Outpatient Prescriptions (Other)  Medication Sig  . amLODipine (NORVASC) 5 MG tablet Take 1 tablet (5 mg total) by mouth daily.  Marland Kitchen aspirin EC 81 MG tablet Take 1 tablet (81 mg total) by mouth daily.  . calcium-vitamin D (OSCAL WITH D) 500-200 MG-UNIT per tablet Take 1 tablet by mouth daily.  . Cholecalciferol (VITAMIN D3) 2000  units TABS Take 2,000 Units by mouth daily.  Marland Kitchen GINKGO BILOBA PO Take 1 tablet by mouth daily.  . Glucosamine-Chondroitin (COSAMIN DS PO) Take 1 tablet by mouth 2 (two) times daily.  . iron polysaccharides (FERREX 150) 150 MG capsule Take 150 mg by mouth daily.  Marland Kitchen losartan (COZAAR) 100 MG tablet Take 1 tablet (100 mg total) by mouth daily.  . metoprolol tartrate (LOPRESSOR) 25 MG tablet Take 12.5 mg by mouth 2 (two) times daily.  . Multiple Vitamin (MULTIVITAMIN WITH MINERALS) TABS Take 1 tablet by mouth daily. Centrum Silver  . naproxen sodium (ANAPROX) 220 MG tablet Take 220 mg by mouth 2 (two) times daily as needed (for pain.).  Marland Kitchen pantoprazole (PROTONIX) 40 MG tablet Take 1 tablet (40 mg total) by mouth daily.   No current facility-administered medications for this visit.  (Other)      REVIEW OF SYSTEMS: ROS    Positive for: Musculoskeletal, Eyes   Negative for: Constitutional, Gastrointestinal, Neurological, Skin, Genitourinary, HENT, Endocrine, Cardiovascular, Respiratory, Psychiatric, Allergic/Imm, Heme/Lymph   Last edited by [name] on 11/06/2016  9:09 AM. (History)       ALLERGIES Allergies  Allergen Reactions  . Penicillins Rash and Other (See Comments)    PATIENT HAS HAD A PCN REACTION WITH IMMEDIATE RASH, FACIAL/TONGUE/THROAT SWELLING, SOB, OR LIGHTHEADEDNESS WITH HYPOTENSION:  #  #  #  YES  #  #  #   Has patient had a PCN reaction causing  severe rash involving mucus membranes or skin necrosis: No Has patient had a PCN reaction that required hospitalization: No Has patient had a PCN reaction occurring within the last 10 years: No If all of the above answers are "NO", then may proceed with Cephalosporin use.   . Codeine Nausea Only  . Tramadol Hcl Other (See Comments)    "Made me crazy"---caused mood changes    PAST MEDICAL HISTORY Past Medical History:  Diagnosis Date  . Acute pancreatitis January 2014  . Chronic back pain   . Chronic fatigue   .  Compression fracture of vertebral column ([place])   . Headache    PMH  . HTN (hypertension)   . Hypercholesteremia   . Macular hole   . Mild aortic stenosis   . MRSA (methicillin resistant Staphylococcus aureus)   . Osteoporosis   . Ovarian cyst   . RBBB   . Staph infection    Past Surgical History:  Procedure Laterality Date  . ABDOMINAL HYSTERECTOMY    . APPENDECTOMY    . athroscopy Right shoulder    . Biopsy Left Breast x 2    . CESAREAN SECTION    . CHOLECYSTECTOMY    . PARS PLANA VITRECTOMY Left 10/09/2016   Procedure: PARS PLANA VITRECTOMY WITH 25 GAUGE;  Surgeon: Bernarda Caffey, MD;  Location: [facility];  Service: Ophthalmology;  Laterality: Left;  . PARS PLANA VITRECTOMY W/ REPAIR OF MACULAR HOLE Right 2015    FAMILY HISTORY Family History  Problem Relation Age of Onset  . Cataracts Mother   . Heart disease Brother   . Heart disease Brother   . Cancer Unknown   . Heart disease Unknown   . Amblyopia Neg Hx   . Blindness Neg Hx   . Glaucoma Neg Hx   . Macular degeneration Neg Hx   . Retinal detachment Neg Hx   . Strabismus Neg Hx   . Retinitis pigmentosa Neg Hx     SOCIAL HISTORY Social History  Substance Use Topics  . Smoking status: Never Smoker  . Smokeless tobacco: Never Used  . Alcohol use Yes     Comment: glass of wine infrequently         OPHTHALMIC EXAM:  Base Eye Exam    Visual Acuity (Snellen - Linear)      Right Left   Dist [place] 20/40 +1 20/150   Dist ph [place] NI 20/100       Tonometry (Applanation, 9:16 AM)      Right Left   Pressure 17 17       Pupils      Dark Light Shape React APD   Right 3 2 Round Slow None   Left 3 2 Round Sluggish None       Extraocular Movement      Right Left    Full Full       Neuro/Psych    Oriented x3:  Yes   Mood/Affect:  Normal       Dilation    Both eyes:  1.0% Mydriacyl, 2.5% Phenylephrine @ 9:16 AM        Slit Lamp and Fundus Exam    External Exam      Right Left   External Normal green  gas bracelet on left wrist       Slit Lamp Exam      Right Left   Lids/Lashes Dermatochalasis - upper lid Dermatochalasis - upper lid   Conjunctiva/Sclera White and quiet White and quiet  Cornea Clear Clear   Anterior Chamber Deep and quiet Deep and quiet   Iris Round; dilated Round; dilated   Lens Posterior chamber intraocular lens, 1+ Posterior capsular opacification Posterior chamber intraocular lens, open PC   Vitreous Post Vitrectomy  post vitrectomy; 25-30% gas fill       Fundus Exam      Right Left   Disc Normal Mild PPA   C/D Ratio 0.3 0.25   Macula RPE changes; closed macular hole Macular hole closed; RPE mottling; small RPE clump superior to fovea with small dot heme;    Vessels Vascular attenuation - mild Vascular attenuation - mild   Periphery Flat, attached, reticular degeneration Flat, attached, gas bubble superiorly obscuring laser changes surrounding small tear 1200; reticular degeneration          IMAGING AND PROCEDURES  Imaging and Procedures for 11/06/16  OCT, Retina - OU - Both Eyes     Right Eye Quality was good. Central Foveal Thickness: 234. Progression has been stable. Findings include abnormal foveal contour (Closed macular hole; scattered ORA).   Left Eye Quality was good. Central Foveal Thickness: 198. Progression has improved. Findings include (Mac hole closed; central ORA).   Notes Images taken, stored on drive   Diagnosis / Impression:  Closed macular holes OU  Clinical management:  See below  Abbreviations: NFP - Normal foveal profile. CME - cystoid macular edema. PED - pigment epithelial detachment. IRF - intraretinal fluid. SRF - subretinal fluid. EZ - ellipsoid zone. ERM - epiretinal membrane. ORA - outer retinal atrophy. ORT - outer retinal tubulation. SRHM - subretinal hyper-reflective material                ASSESSMENT/PLAN:    ICD-10-CM   1. Macular hole of left eye s/p PPV/MP/12%C3F8 OS, 8.29.18 H35.342 OCT, Retina -  OU - Both Eyes  2. Macular hole, right H35.341 OCT, Retina - OU - Both Eyes  3. Pseudophakia of both eyes Z96.1   4. PCO (posterior capsular opacification), right H26.491     1. Full-thickness macular hole, left eye.   - POW4 s/p PPV/ICG/MP/EL/FAX/12% C3F8 OS, 8.29.18             - doing well -- VA 20/100 ph             - macular hole closed; retina in good position             - IOP okay today             - begin PF taper -- 3,2,1 drops daily, decrease weekly OS  - dec Alphagan to 1x daily OS  - Polysporin ung OSPRN  - avoid laying flat on back, can begin to increase activities  - green bracelet in place on left wrist -- continue             - post op drop and positioning instructions reviewed             - tylenol/ibuprofen for pain  - F/U 4 weeks  2. History of macular hole, right eye - s/p mac hole repair in 2015 -- Dr. Starling Manns - hole closed, stable, VA 20/60 - monitor  3,4. Pseudophakia w/ mild PCO OD - PCIOL in good position OU - PCO not visually significant at this time - may benefit from YAG capsulotomy w/ Dr. Ellie Lunch in the future   Ophthalmic Meds Ordered this visit:  No orders of the defined types were placed in this encounter.  Return in about 4 weeks (around 12/04/2016) for POV 3; S/P PPV/ICG/MP/EL/FAX/ 12% C3F8 OS (10/09/2016).  There are no Patient Instructions on file for this visit.   Explained the diagnoses, plan, and follow up with the patient and they expressed understanding.  Patient expressed understanding of the importance of proper follow up care.   Gardiner Sleeper, M.D., Ph.D. Vitreoretinal Surgeon [facility] 11/06/16     Abbreviations: M myopia (nearsighted); A astigmatism; H hyperopia (farsighted); P presbyopia; Mrx spectacle prescription;  CTL contact lenses; OD right eye; OS left eye; OU both eyes  XT exotropia; ET esotropia; PEK punctate epithelial keratitis; PEE punctate epithelial erosions; DES dry  eye syndrome; MGD meibomian gland dysfunction; ATs artificial tears; PFAT's preservative free artificial tears; [place] nuclear sclerotic cataract; PSC posterior subcapsular cataract; ERM epi-retinal membrane; PVD posterior vitreous detachment; RD retinal detachment; DM diabetes mellitus; DR diabetic retinopathy; NPDR non-proliferative diabetic retinopathy; PDR proliferative diabetic retinopathy; CSME clinically significant macular edema; DME diabetic macular edema; dbh dot blot hemorrhages; CWS cotton wool spot; POAG primary open angle glaucoma; C/D cup-to-disc ratio; HVF humphrey visual field; GVF goldmann visual field; OCT optical coherence tomography; IOP intraocular pressure; BRVO Branch retinal vein occlusion; CRVO central retinal vein occlusion; CRAO central retinal artery occlusion; BRAO branch retinal artery occlusion; RT retinal tear; SB scleral buckle; PPV pars plana vitrectomy; VH Vitreous hemorrhage; PRP panretinal laser photocoagulation; IVK intravitreal kenalog; VMT vitreomacular traction; MH Macular hole;  NVD neovascularization of the disc; NVE neovascularization elsewhere; AREDS age related eye disease study; ARMD age related macular degeneration; POAG primary open angle glaucoma; EBMD epithelial/anterior basement membrane dystrophy; ACIOL anterior chamber intraocular lens; IOL intraocular lens; PCIOL posterior chamber intraocular lens; Phaco/IOL phacoemulsification with intraocular lens placement; [place] photorefractive keratectomy; LASIK laser assisted in situ keratomileusis; HTN hypertension; DM diabetes mellitus; COPD chronic obstructive pulmonary disease

## 2016-11-06 ENCOUNTER — Encounter (INDEPENDENT_AMBULATORY_CARE_PROVIDER_SITE_OTHER): Payer: Self-pay | Admitting: Ophthalmology

## 2016-11-06 ENCOUNTER — Ambulatory Visit (INDEPENDENT_AMBULATORY_CARE_PROVIDER_SITE_OTHER): Payer: Medicare Other | Admitting: Ophthalmology

## 2016-11-06 DIAGNOSIS — H26491 Other secondary cataract, right eye: Secondary | ICD-10-CM

## 2016-11-06 DIAGNOSIS — H35341 Macular cyst, hole, or pseudohole, right eye: Secondary | ICD-10-CM

## 2016-11-06 DIAGNOSIS — Z961 Presence of intraocular lens: Secondary | ICD-10-CM

## 2016-11-06 DIAGNOSIS — H35342 Macular cyst, hole, or pseudohole, left eye: Secondary | ICD-10-CM

## 2016-12-03 NOTE — Progress Notes (Signed)
[facility] Note  12/04/2016     CHIEF COMPLAINT Patient presents for Post-op Follow-up   HISTORY OF PRESENT ILLNESS: Mallory Jensen is a 83 y.o. female who presents to the clinic today for:   HPI    Post-op Follow-up  In left eye.  Discomfort includes none.  Vision is improved.        Comments  S/P PPV/ICG/MP/EL/FAX/12% C3F8 OS (10/09/2016);  Pt states she is still able to see a bubble; Pt states that she feels OS VA is a bit better; Pt states that she is using PF OS qd;      Last edited by Mallory Jensen on 12/04/2016  9:14 AM. (History)       Referring physician: Haywood Pao, MD [place], [place] [address]  HISTORICAL INFORMATION:   Selected notes from the [place] Referral from Dr. Ellie Jensen for mac hole OS S/p PPV/ICG/MP/EL/FAX/12% C3F8 OS, 8.29.18 Past ocular history of macular hole OD s/p repair in 2015 w/ Mallory Jensen     CURRENT MEDICATIONS: Current Outpatient Prescriptions (Ophthalmic Drugs)  Medication Sig  . Polyethyl Glycol-Propyl Glycol (SYSTANE ULTRA) 0.4-0.3 % SOLN Place 1-2 drops into both eyes 4 (four) times daily as needed (for dry eyes).  . prednisoLONE acetate (PRED FORTE) 1 % ophthalmic suspension Place 1 drop into the left eye 4 (four) times daily.   No current facility-administered medications for this visit.  (Ophthalmic Drugs)   Current Outpatient Prescriptions (Other)  Medication Sig  . amLODipine (NORVASC) 5 MG tablet Take 1 tablet (5 mg total) by mouth daily.  Marland Kitchen aspirin EC 81 MG tablet Take 1 tablet (81 mg total) by mouth daily.  . calcium-vitamin D (OSCAL WITH D) 500-200 MG-UNIT per tablet Take 1 tablet by mouth daily.  . Cholecalciferol (VITAMIN D3) 2000 units TABS Take 2,000 Units by mouth daily.  Marland Kitchen GINKGO BILOBA PO Take 1 tablet by mouth daily.  . Glucosamine-Chondroitin (COSAMIN DS PO) Take 1 tablet by mouth 2 (two) times daily.  . iron polysaccharides (FERREX 150) 150 MG  capsule Take 150 mg by mouth daily.  Marland Kitchen losartan (COZAAR) 100 MG tablet Take 1 tablet (100 mg total) by mouth daily.  . metoprolol tartrate (LOPRESSOR) 25 MG tablet Take 12.5 mg by mouth 2 (two) times daily.  . Multiple Vitamin (MULTIVITAMIN WITH MINERALS) TABS Take 1 tablet by mouth daily. Centrum Silver  . naproxen sodium (ANAPROX) 220 MG tablet Take 220 mg by mouth 2 (two) times daily as needed (for pain.).  Marland Kitchen pantoprazole (PROTONIX) 40 MG tablet Take 1 tablet (40 mg total) by mouth daily.   No current facility-administered medications for this visit.  (Other)      REVIEW OF SYSTEMS: ROS    Positive for: Skin, Musculoskeletal   Negative for: Constitutional, Gastrointestinal, Neurological, Genitourinary, HENT, Endocrine, Cardiovascular, Eyes, Respiratory, Psychiatric, Allergic/Imm, Heme/Lymph   Last edited by Mallory Jensen on 12/04/2016  9:14 AM. (History)       ALLERGIES Allergies  Allergen Reactions  . Penicillins Rash and Other (See Comments)    PATIENT HAS HAD A PCN REACTION WITH IMMEDIATE RASH, FACIAL/TONGUE/THROAT SWELLING, SOB, OR LIGHTHEADEDNESS WITH HYPOTENSION:  #  #  #  YES  #  #  #   Has patient had a PCN reaction causing severe rash involving mucus membranes or skin necrosis: No Has patient had a PCN reaction that required hospitalization: No Has patient had a PCN reaction occurring within the last 10 years: No If  all of the above answers are "NO", then may proceed with Cephalosporin use.   . Codeine Nausea Only  . Tramadol Hcl Other (See Comments)    "Made me crazy"---caused mood changes    PAST MEDICAL HISTORY Past Medical History:  Diagnosis Date  . Acute pancreatitis January 2014  . Chronic back pain   . Chronic fatigue   . Compression fracture of vertebral column ([place])   . Headache    PMH  . HTN (hypertension)   . Hypercholesteremia   . Macular hole   . Mild aortic stenosis   . MRSA (methicillin resistant Staphylococcus aureus)   .  Osteoporosis   . Ovarian cyst   . RBBB   . Staph infection    Past Surgical History:  Procedure Laterality Date  . ABDOMINAL HYSTERECTOMY    . APPENDECTOMY    . athroscopy Right shoulder    . Biopsy Left Breast x 2    . CESAREAN SECTION    . CHOLECYSTECTOMY    . PARS PLANA VITRECTOMY Left 10/09/2016   Procedure: PARS PLANA VITRECTOMY WITH 25 GAUGE;  Surgeon: Mallory Caffey, MD;  Location: [facility];  Service: Ophthalmology;  Laterality: Left;  . PARS PLANA VITRECTOMY W/ REPAIR OF MACULAR HOLE Right 2015    FAMILY HISTORY Family History  Problem Relation Age of Onset  . Cataracts Mother   . Heart disease Brother   . Heart disease Brother   . Cancer Unknown   . Heart disease Unknown   . Amblyopia Neg Hx   . Blindness Neg Hx   . Glaucoma Neg Hx   . Macular degeneration Neg Hx   . Retinal detachment Neg Hx   . Strabismus Neg Hx   . Retinitis pigmentosa Neg Hx     SOCIAL HISTORY Social History  Substance Use Topics  . Smoking status: Never Smoker  . Smokeless tobacco: Never Used  . Alcohol use Yes     Comment: glass of wine infrequently         OPHTHALMIC EXAM:  Base Eye Exam    Visual Acuity (Snellen - Linear)      Right Left   Dist [place] 20/50 20/150   Dist ph [place] 20/40 20/100       Tonometry (Applanation, 9:19 AM)      Right Left   Pressure 16 15       Pupils      Dark Light Shape React APD   Right 3 2 Round Sluggish None   Left 3 2 Round Sluggish None       Visual Fields (Counting fingers)      Left Right    Full Full       Extraocular Movement      Right Left    Full, Ortho Full, Ortho       Neuro/Psych    Oriented x3:  Yes   Mood/Affect:  Normal       Dilation    Both eyes:  1.0% Mydriacyl, 2.5% Phenylephrine @ 9:19 AM        Slit Lamp and Fundus Exam    External Exam      Right Left   External Normal green gas bracelet on left wrist       Slit Lamp Exam      Right Left   Lids/Lashes Dermatochalasis - upper lid Dermatochalasis -  upper lid   Conjunctiva/Sclera White and quiet White and quiet   Cornea Clear Clear   Anterior Chamber Deep  and quiet Deep and quiet   Iris Round; dilated Round; dilated   Lens Posterior chamber intraocular lens, 1+ Posterior capsular opacification Posterior chamber intraocular lens, open PC   Vitreous Post Vitrectomy  post vitrectomy; 5% gas fill       Fundus Exam      Right Left   Disc Normal Mild PPA   C/D Ratio 0.3 0.25   Macula RPE changes; closed macular hole Macular hole closed; RPE mottling; small RPE clump superior to fovea with small dot heme resolving    Vessels Vascular attenuation - mild Vascular attenuation - mild   Periphery Flat, attached, reticular degeneration Flat, attached, laser changes surrounding small tear 1200; reticular degeneration          IMAGING AND PROCEDURES  Imaging and Procedures for 12/04/16  OCT, Retina - OU - Both Eyes     Right Eye Quality was good. Central Foveal Thickness: 228. Progression has been stable. Findings include abnormal foveal contour, no IRF, no SRF (Closed macular hole; scattered ORA).   Left Eye Quality was good. Central Foveal Thickness: 195. Progression has been stable. Findings include no IRF, no SRF (Mac hole closed; central ORA / ellipsoid disruption).   Notes Images taken, stored on drive   Diagnosis / Impression:  OD: closed macular hole with focal ORA; no IRF/SRF OS: closed macular hole with central ORA and ellipsoid disruption; no IRF/SRF  Clinical management:  See below  Abbreviations: NFP - Normal foveal profile. CME - cystoid macular edema. PED - pigment epithelial detachment. IRF - intraretinal fluid. SRF - subretinal fluid. EZ - ellipsoid zone. ERM - epiretinal membrane. ORA - outer retinal atrophy. ORT - outer retinal tubulation. SRHM - subretinal hyper-reflective material                ASSESSMENT/PLAN:    ICD-10-CM   1. Macular hole of left eye s/p PPV/MP/12%C3F8 OS, 8.29.18 H35.342 OCT,  Retina - OU - Both Eyes  2. Macular hole, right H35.341 OCT, Retina - OU - Both Eyes  3. Pseudophakia of both eyes Z96.1   4. PCO (posterior capsular opacification), right H26.491     1. Full-thickness macular hole, left eye.   - POW8 s/p PPV/ICG/MP/EL/FAX/12% C3F8 OS, 8.29.18             - doing well -- VA 20/100 ph             - macular hole closed; retina in good position  - discussed OCT findings, prognosis and recovery             - IOP okay today             - completing PF taper -- currently at qdaily; ok to stop  - Alphagan to 1x daily OS -- ran out -- okay to stop  - Polysporin ung OSPRN  - avoid laying flat on back, can begin to increase activities  - green bracelet in place on left wrist -- okay to remove once bubble is gone             - post op drop and positioning instructions reviewed  - F/U 4 weeks  2. History of macular hole, right eye - s/p mac hole repair in 2015 -- Dr. Starling Manns - hole closed, stable, VA 20/60 - monitor  3,4. Pseudophakia w/ mild PCO OD - PCIOL in good position OU - PCO not visually significant at this time - may benefit from YAG capsulotomy w/ Dr. Ellie Jensen in  the future   Ophthalmic Meds Ordered this visit:  No orders of the defined types were placed in this encounter.      Return in about 4 weeks (around 01/01/2017) for POV.  There are no Patient Instructions on file for this visit.   Explained the diagnoses, plan, and follow up with the patient and they expressed understanding.  Patient expressed understanding of the importance of proper follow up care.   Gardiner Sleeper, M.D., Ph.D. Diseases & Surgery of the Retina and Vitreous [facility] 12/04/16      Abbreviations: M myopia (nearsighted); A astigmatism; H hyperopia (farsighted); P presbyopia; Mrx spectacle prescription;  CTL contact lenses; OD right eye; OS left eye; OU both eyes  XT exotropia; ET esotropia; PEK punctate epithelial keratitis;  PEE punctate epithelial erosions; DES dry eye syndrome; MGD meibomian gland dysfunction; ATs artificial tears; PFAT's preservative free artificial tears; [place] nuclear sclerotic cataract; PSC posterior subcapsular cataract; ERM epi-retinal membrane; PVD posterior vitreous detachment; RD retinal detachment; DM diabetes mellitus; DR diabetic retinopathy; NPDR non-proliferative diabetic retinopathy; PDR proliferative diabetic retinopathy; CSME clinically significant macular edema; DME diabetic macular edema; dbh dot blot hemorrhages; CWS cotton wool spot; POAG primary open angle glaucoma; C/D cup-to-disc ratio; HVF humphrey visual field; GVF goldmann visual field; OCT optical coherence tomography; IOP intraocular pressure; BRVO Branch retinal vein occlusion; CRVO central retinal vein occlusion; CRAO central retinal artery occlusion; BRAO branch retinal artery occlusion; RT retinal tear; SB scleral buckle; PPV pars plana vitrectomy; VH Vitreous hemorrhage; PRP panretinal laser photocoagulation; IVK intravitreal kenalog; VMT vitreomacular traction; MH Macular hole;  NVD neovascularization of the disc; NVE neovascularization elsewhere; AREDS age related eye disease study; ARMD age related macular degeneration; POAG primary open angle glaucoma; EBMD epithelial/anterior basement membrane dystrophy; ACIOL anterior chamber intraocular lens; IOL intraocular lens; PCIOL posterior chamber intraocular lens; Phaco/IOL phacoemulsification with intraocular lens placement; [place] photorefractive keratectomy; LASIK laser assisted in situ keratomileusis; HTN hypertension; DM diabetes mellitus; COPD chronic obstructive pulmonary disease

## 2016-12-04 ENCOUNTER — Encounter (INDEPENDENT_AMBULATORY_CARE_PROVIDER_SITE_OTHER): Payer: Self-pay | Admitting: Ophthalmology

## 2016-12-04 ENCOUNTER — Ambulatory Visit (INDEPENDENT_AMBULATORY_CARE_PROVIDER_SITE_OTHER): Payer: Medicare Other | Admitting: Ophthalmology

## 2016-12-04 DIAGNOSIS — Z961 Presence of intraocular lens: Secondary | ICD-10-CM

## 2016-12-04 DIAGNOSIS — H35342 Macular cyst, hole, or pseudohole, left eye: Secondary | ICD-10-CM | POA: Diagnosis not present

## 2016-12-04 DIAGNOSIS — H35341 Macular cyst, hole, or pseudohole, right eye: Secondary | ICD-10-CM

## 2016-12-04 DIAGNOSIS — H26491 Other secondary cataract, right eye: Secondary | ICD-10-CM

## 2016-12-19 ENCOUNTER — Encounter (HOSPITAL_COMMUNITY): Payer: Self-pay | Admitting: Ophthalmology

## 2016-12-31 NOTE — Progress Notes (Signed)
[facility] Note  01/01/2017     CHIEF COMPLAINT Patient presents for Retina Follow Up   HISTORY OF PRESENT ILLNESS: Mallory Jensen is a 83 y.o. female who presents to the clinic today for:   HPI    Retina Follow Up    In left eye.  Severity is mild.  Since onset it is gradually improving.  I, the attending physician,  performed the HPI with the patient and updated documentation appropriately.          Comments    POV # 5 S/P PPV/IVG Stain/MP Peel/ELI Fax 12% C3F8 OS  10/10/16 For full Thickness macular Hole OS . Patient states VA is improving "I can see now it will  be better when I get my glasses." Denies floaters, flashes and pain .  Denies eye gtts . Takes vits QD        Last edited by Bernarda Caffey, MD on 01/01/2017 10:44 AM. (History)       Referring physician: Haywood Pao, MD [place], [place] [id number]  HISTORICAL INFORMATION:   Selected notes from the [facility] Referral from Dr. Ellie Lunch for mac hole OS S/p PPV/ICG/MP/EL/FAX/12% C3F8 OS, 8.29.18 Past ocular history of macular hole OD s/p repair in 2015 w/ [place]     CURRENT MEDICATIONS: Current Outpatient Medications (Ophthalmic Drugs)  Medication Sig  . Polyethyl Glycol-Propyl Glycol (SYSTANE ULTRA) 0.4-0.3 % SOLN Place 1-2 drops into both eyes 4 (four) times daily as needed (for dry eyes).  . prednisoLONE acetate (PRED FORTE) 1 % ophthalmic suspension Place 1 drop into the left eye 4 (four) times daily.   No current facility-administered medications for this visit.  (Ophthalmic Drugs)   Current Outpatient Medications (Other)  Medication Sig  . amLODipine (NORVASC) 5 MG tablet Take 1 tablet (5 mg total) by mouth daily.  Marland Kitchen aspirin EC 81 MG tablet Take 1 tablet (81 mg total) by mouth daily.  . calcium-vitamin D (OSCAL WITH D) 500-200 MG-UNIT per tablet Take 1 tablet by mouth daily.  . Cholecalciferol (VITAMIN D3) 2000 units TABS Take 2,000 Units by  mouth daily.  Marland Kitchen GINKGO BILOBA PO Take 1 tablet by mouth daily.  . Glucosamine-Chondroitin (COSAMIN DS PO) Take 1 tablet by mouth 2 (two) times daily.  . iron polysaccharides (FERREX 150) 150 MG capsule Take 150 mg by mouth daily.  Marland Kitchen losartan (COZAAR) 100 MG tablet Take 1 tablet (100 mg total) by mouth daily.  . metoprolol tartrate (LOPRESSOR) 25 MG tablet Take 12.5 mg by mouth 2 (two) times daily.  . Multiple Vitamin (MULTIVITAMIN WITH MINERALS) TABS Take 1 tablet by mouth daily. Centrum Silver  . naproxen sodium (ANAPROX) 220 MG tablet Take 220 mg by mouth 2 (two) times daily as needed (for pain.).  Marland Kitchen pantoprazole (PROTONIX) 40 MG tablet Take 1 tablet (40 mg total) by mouth daily.   No current facility-administered medications for this visit.  (Other)      REVIEW OF SYSTEMS: ROS    Positive for: Musculoskeletal, Cardiovascular, Eyes, Respiratory   Negative for: Constitutional, Gastrointestinal, Neurological, Skin, Genitourinary, HENT, Endocrine, Psychiatric, Allergic/Imm, Heme/Lymph   Last edited by Zenovia Jordan, LPN on 61/60/7371  0:62 AM. (History)       ALLERGIES Allergies  Allergen Reactions  . Penicillins Rash and Other (See Comments)    PATIENT HAS HAD A PCN REACTION WITH IMMEDIATE RASH, FACIAL/TONGUE/THROAT SWELLING, SOB, OR LIGHTHEADEDNESS WITH HYPOTENSION:  #  #  #  YES  #  #  #  Has patient had a PCN reaction causing severe rash involving mucus membranes or skin necrosis: No Has patient had a PCN reaction that required hospitalization: No Has patient had a PCN reaction occurring within the last 10 years: No If all of the above answers are "NO", then may proceed with Cephalosporin use.   . Codeine Nausea Only  . Tramadol Hcl Other (See Comments)    "Made me crazy"---caused mood changes    PAST MEDICAL HISTORY Past Medical History:  Diagnosis Date  . Acute pancreatitis January 2014  . Chronic back pain   . Chronic fatigue   . Compression fracture of  vertebral column ([place])   . Headache    PMH  . HTN (hypertension)   . Hypercholesteremia   . Macular hole   . Mild aortic stenosis   . MRSA (methicillin resistant Staphylococcus aureus)   . Osteoporosis   . Ovarian cyst   . RBBB   . Staph infection    Past Surgical History:  Procedure Laterality Date  . ABDOMINAL HYSTERECTOMY    . APPENDECTOMY    . athroscopy Right shoulder    . Biopsy Left Breast x 2    . CESAREAN SECTION    . CHOLECYSTECTOMY    . PARS PLANA VITRECTOMY Left 10/09/2016   Procedure: PARS PLANA VITRECTOMY WITH 25 GAUGE;  Surgeon: Bernarda Caffey, MD;  Location: [facility];  Service: Ophthalmology;  Laterality: Left;  . PARS PLANA VITRECTOMY W/ REPAIR OF MACULAR HOLE Right 2015    FAMILY HISTORY Family History  Problem Relation Age of Onset  . Cataracts Mother   . Heart disease Brother   . Heart disease Brother   . Cancer Unknown   . Heart disease Unknown   . Amblyopia Neg Hx   . Blindness Neg Hx   . Glaucoma Neg Hx   . Macular degeneration Neg Hx   . Retinal detachment Neg Hx   . Strabismus Neg Hx   . Retinitis pigmentosa Neg Hx     SOCIAL HISTORY Social History   Tobacco Use  . Smoking status: Never Smoker  . Smokeless tobacco: Never Used  Substance Use Topics  . Alcohol use: Yes    Comment: glass of wine infrequently  . Drug use: No         OPHTHALMIC EXAM:  Base Eye Exam    Visual Acuity (Snellen - Linear)      Right Left   Dist [place] 20/40 20/100 -1   Dist ph [place] 20/100 +2       Tonometry (Tonopen, 9:46 AM)      Right Left   Pressure 21 18       Pupils      Dark Light Shape React APD   Right 2  Round Minimal None   Left 3 2 Round Sluggish None       Visual Fields (Counting fingers)      Left Right    Full Full       Extraocular Movement      Right Left    Ortho Ortho    -- -- --  --  --  -- -- --   -- -- --  --  --  -- -- --         Neuro/Psych    Oriented x3:  Yes   Mood/Affect:  Normal       Dilation     Both eyes:  1.0% Mydriacyl, 2.5% Phenylephrine @ 9:46 AM  Slit Lamp and Fundus Exam    External Exam      Right Left   External Normal green gas bracelet on left wrist       Slit Lamp Exam      Right Left   Lids/Lashes Dermatochalasis - upper lid Dermatochalasis - upper lid   Conjunctiva/Sclera White and quiet White and quiet   Cornea Clear Clear   Anterior Chamber Deep and quiet Deep and quiet   Iris Round; dilated Round; dilated   Lens Posterior chamber intraocular lens, 1+ Posterior capsular opacification Posterior chamber intraocular lens, open PC   Vitreous Post Vitrectomy  post vitrectomy; gas bubble gone       Fundus Exam      Right Left   Disc Normal Mild PPA   C/D Ratio 0.3 0.25   Macula RPE changes; closed macular hole Macular hole closed; RPE mottling; small RPE clump superior to fovea with small dot heme resolving    Vessels Vascular attenuation - mild Vascular attenuation - mild   Periphery Flat, attached, reticular degeneration Flat, attached, laser changes surrounding small tear 1200; reticular degeneration          IMAGING AND PROCEDURES  Imaging and Procedures for 01/01/17  OCT, Retina - OU - Both Eyes     Right Eye Quality was good. Central Foveal Thickness: 222. Progression has been stable. Findings include abnormal foveal contour, no IRF, no SRF (Closed macular hole; scattered ORA).   Left Eye Quality was good. Central Foveal Thickness: 202. Progression has been stable. Findings include no IRF, no SRF (Mac hole closed; central ORA / ellipsoid disruption).   Notes Images taken, stored on drive   Diagnosis / Impression:  OD: closed macular hole with focal ORA; no IRF/SRF OS: closed macular hole with central ORA and ellipsoid disruption improving slightly; no IRF/SRF  Clinical management:  See below  Abbreviations: NFP - Normal foveal profile. CME - cystoid macular edema. PED - pigment epithelial detachment. IRF - intraretinal fluid. SRF -  subretinal fluid. EZ - ellipsoid zone. ERM - epiretinal membrane. ORA - outer retinal atrophy. ORT - outer retinal tubulation. SRHM - subretinal hyper-reflective material                  ASSESSMENT/PLAN:    ICD-10-CM   1. Macular hole of left eye s/p PPV/MP/12%C3F8 OS, 8.29.18 H35.342   2. Macular hole, right H35.341   3. Pseudophakia of both eyes Z96.1   4. PCO (posterior capsular opacification), right H26.491   5. Macular hole of left eye H35.342 OCT, Retina - OU - Both Eyes    1. Full-thickness macular hole, left eye.   - POW8 s/p PPV/ICG/MP/EL/FAX/12% C3F8 OS, 8.29.18             - doing well -- VA 20/100 ph             - macular hole closed; retina in good position  - discussed OCT findings, prognosis and recovery             - IOP borderline today             - completed post op drops  - can resume full activity  - cleared for MRx with [place] when available  - F/U 3-4 mos  2. History of macular hole, right eye - s/p mac hole repair in 2015 -- Dr. Starling Manns - hole closed, stable, VA 20/40 - monitor  3,4. Pseudophakia w/ mild PCO OD - PCIOL in  good position OU - may benefit from YAG capsulotomy w/ Dr. Ellie Lunch in the future   Ophthalmic Meds Ordered this visit:  No orders of the defined types were placed in this encounter.      Return for 3-4 mos, Dilated Exam, OCT.  There are no Patient Instructions on file for this visit.   Explained the diagnoses, plan, and follow up with the patient and they expressed understanding.  Patient expressed understanding of the importance of proper follow up care.   Gardiner Sleeper, M.D., Ph.D. Diseases & Surgery of the Retina and Vitreous [facility] 01/01/17      Abbreviations: M myopia (nearsighted); A astigmatism; H hyperopia (farsighted); P presbyopia; Mrx spectacle prescription;  CTL contact lenses; OD right eye; OS left eye; OU both eyes  XT exotropia; ET esotropia; PEK punctate  epithelial keratitis; PEE punctate epithelial erosions; DES dry eye syndrome; MGD meibomian gland dysfunction; ATs artificial tears; PFAT's preservative free artificial tears; [place] nuclear sclerotic cataract; PSC posterior subcapsular cataract; ERM epi-retinal membrane; PVD posterior vitreous detachment; RD retinal detachment; DM diabetes mellitus; DR diabetic retinopathy; NPDR non-proliferative diabetic retinopathy; PDR proliferative diabetic retinopathy; CSME clinically significant macular edema; DME diabetic macular edema; dbh dot blot hemorrhages; CWS cotton wool spot; POAG primary open angle glaucoma; C/D cup-to-disc ratio; HVF humphrey visual field; GVF goldmann visual field; OCT optical coherence tomography; IOP intraocular pressure; BRVO Branch retinal vein occlusion; CRVO central retinal vein occlusion; CRAO central retinal artery occlusion; BRAO branch retinal artery occlusion; RT retinal tear; SB scleral buckle; PPV pars plana vitrectomy; VH Vitreous hemorrhage; PRP panretinal laser photocoagulation; IVK intravitreal kenalog; VMT vitreomacular traction; MH Macular hole;  NVD neovascularization of the disc; NVE neovascularization elsewhere; AREDS age related eye disease study; ARMD age related macular degeneration; POAG primary open angle glaucoma; EBMD epithelial/anterior basement membrane dystrophy; ACIOL anterior chamber intraocular lens; IOL intraocular lens; PCIOL posterior chamber intraocular lens; Phaco/IOL phacoemulsification with intraocular lens placement; [place] photorefractive keratectomy; LASIK laser assisted in situ keratomileusis; HTN hypertension; DM diabetes mellitus; COPD chronic obstructive pulmonary disease

## 2017-01-01 ENCOUNTER — Ambulatory Visit (INDEPENDENT_AMBULATORY_CARE_PROVIDER_SITE_OTHER): Payer: Medicare Other | Admitting: Ophthalmology

## 2017-01-01 ENCOUNTER — Encounter (INDEPENDENT_AMBULATORY_CARE_PROVIDER_SITE_OTHER): Payer: Self-pay | Admitting: Ophthalmology

## 2017-01-01 DIAGNOSIS — H35341 Macular cyst, hole, or pseudohole, right eye: Secondary | ICD-10-CM

## 2017-01-01 DIAGNOSIS — H35342 Macular cyst, hole, or pseudohole, left eye: Secondary | ICD-10-CM

## 2017-01-01 DIAGNOSIS — H26491 Other secondary cataract, right eye: Secondary | ICD-10-CM

## 2017-01-01 DIAGNOSIS — Z961 Presence of intraocular lens: Secondary | ICD-10-CM

## 2017-01-06 DIAGNOSIS — H3530 Unspecified macular degeneration: Secondary | ICD-10-CM | POA: Diagnosis not present

## 2017-01-06 DIAGNOSIS — F321 Major depressive disorder, single episode, moderate: Secondary | ICD-10-CM | POA: Diagnosis not present

## 2017-01-06 DIAGNOSIS — M546 Pain in thoracic spine: Secondary | ICD-10-CM | POA: Diagnosis not present

## 2017-01-06 DIAGNOSIS — M810 Age-related osteoporosis without current pathological fracture: Secondary | ICD-10-CM | POA: Diagnosis not present

## 2017-01-06 DIAGNOSIS — E781 Pure hyperglyceridemia: Secondary | ICD-10-CM | POA: Diagnosis not present

## 2017-01-06 DIAGNOSIS — M1990 Unspecified osteoarthritis, unspecified site: Secondary | ICD-10-CM | POA: Diagnosis not present

## 2017-01-06 DIAGNOSIS — K219 Gastro-esophageal reflux disease without esophagitis: Secondary | ICD-10-CM | POA: Diagnosis not present

## 2017-01-06 DIAGNOSIS — I10 Essential (primary) hypertension: Secondary | ICD-10-CM | POA: Diagnosis not present

## 2017-01-06 DIAGNOSIS — E559 Vitamin D deficiency, unspecified: Secondary | ICD-10-CM | POA: Diagnosis not present

## 2017-01-06 DIAGNOSIS — Z6828 Body mass index (BMI) 28.0-28.9, adult: Secondary | ICD-10-CM | POA: Diagnosis not present

## 2017-01-06 DIAGNOSIS — D638 Anemia in other chronic diseases classified elsewhere: Secondary | ICD-10-CM | POA: Diagnosis not present

## 2017-02-25 DIAGNOSIS — H26491 Other secondary cataract, right eye: Secondary | ICD-10-CM | POA: Diagnosis not present

## 2017-02-25 DIAGNOSIS — H35373 Puckering of macula, bilateral: Secondary | ICD-10-CM | POA: Diagnosis not present

## 2017-03-27 DIAGNOSIS — H26491 Other secondary cataract, right eye: Secondary | ICD-10-CM | POA: Diagnosis not present

## 2017-04-04 NOTE — Progress Notes (Signed)
[facility] Note  04/09/2017     CHIEF COMPLAINT Patient presents for Retina Follow Up   HISTORY OF PRESENT ILLNESS: Mallory Jensen is a 84 y.o. female who presents to the clinic today for:   HPI    Retina Follow Up    Patient presents with  Other.  In left eye.  This started 6 months ago.  Severity is mild.  Since onset it is stable.  I, the attending physician,  performed the HPI with the patient and updated documentation appropriately.          Comments    F/U [facility]; S/P PPV/MP/EL/FAX 12% C3F8 OS (10/09/16)Patient states" her vision is about the same,not as clear as it uses to be,  Dr. Carolyn Stare told her her vision will never get any better" Pt reports she had laser tx OD two weeks ago.Pt is taking Vits qd, using Systane gtt's PRN        Last edited by Bernarda Caffey, MD on 04/09/2017  9:41 AM. (History)    Pt states she has seen Dr. Ellie Lunch since being seen here last; Pt states Dr. Ellie Lunch did not prescribe specs, states she was told "they wouldn't help her"; Pt states Dr. Ellie Lunch "did a laser procedure on me", states she doesn't feel the laser procedure helped; Pt reports she only wears readers; Pt states OS was "puffy" upon waking this AM;    Referring physician: Haywood Pao, MD [place], [place] [address]  HISTORICAL INFORMATION:   Selected notes from the MEDICAL RECORD NUMBER Referral from Dr. Ellie Lunch for mac hole OS S/p PPV/ICG/MP/EL/FAX/12% C3F8 OS, 8.29.18 Past ocular history of macular hole OD s/p repair in 2015 w/ [place]     CURRENT MEDICATIONS: Current Outpatient Medications (Ophthalmic Drugs)  Medication Sig  . Polyethyl Glycol-Propyl Glycol (SYSTANE ULTRA) 0.4-0.3 % SOLN Place 1-2 drops into both eyes 4 (four) times daily as needed (for dry eyes).  . prednisoLONE acetate (PRED FORTE) 1 % ophthalmic suspension Place 1 drop into the left eye 4 (four) times daily. (Patient not taking: Reported on 04/09/2017)   No current  facility-administered medications for this visit.  (Ophthalmic Drugs)   Current Outpatient Medications (Other)  Medication Sig  . amLODipine (NORVASC) 5 MG tablet Take 1 tablet (5 mg total) by mouth daily.  Marland Kitchen aspirin EC 81 MG tablet Take 1 tablet (81 mg total) by mouth daily.  . calcium-vitamin D (OSCAL WITH D) 500-200 MG-UNIT per tablet Take 1 tablet by mouth daily.  . Cholecalciferol (VITAMIN D3) 2000 units TABS Take 2,000 Units by mouth daily.  Marland Kitchen GINKGO BILOBA PO Take 1 tablet by mouth daily.  . Glucosamine-Chondroitin (COSAMIN DS PO) Take 1 tablet by mouth 2 (two) times daily.  . iron polysaccharides (FERREX 150) 150 MG capsule Take 150 mg by mouth daily.  Marland Kitchen losartan (COZAAR) 100 MG tablet Take 1 tablet (100 mg total) by mouth daily.  . metoprolol tartrate (LOPRESSOR) 25 MG tablet Take 12.5 mg by mouth 2 (two) times daily.  . Multiple Vitamin (MULTIVITAMIN WITH MINERALS) TABS Take 1 tablet by mouth daily. Centrum Silver  . naproxen sodium (ANAPROX) 220 MG tablet Take 220 mg by mouth 2 (two) times daily as needed (for pain.).  Marland Kitchen pantoprazole (PROTONIX) 40 MG tablet Take 1 tablet (40 mg total) by mouth daily.   No current facility-administered medications for this visit.  (Other)      REVIEW OF SYSTEMS: ROS    Positive for: Musculoskeletal,  Cardiovascular, Eyes   Negative for: Constitutional, Neurological, Skin, Genitourinary, HENT, Endocrine, Respiratory, Psychiatric, Allergic/Imm, Heme/Lymph   Last edited by Zenovia Jordan, LPN on 4/94/4967  5:91 AM. (History)       ALLERGIES Allergies  Allergen Reactions  . Penicillins Rash and Other (See Comments)    PATIENT HAS HAD A PCN REACTION WITH IMMEDIATE RASH, FACIAL/TONGUE/THROAT SWELLING, SOB, OR LIGHTHEADEDNESS WITH HYPOTENSION:  #  #  #  YES  #  #  #   Has patient had a PCN reaction causing severe rash involving mucus membranes or skin necrosis: No Has patient had a PCN reaction that required hospitalization: No Has patient  had a PCN reaction occurring within the last 10 years: No If all of the above answers are "NO", then may proceed with Cephalosporin use.   . Codeine Nausea Only  . Tramadol Hcl Other (See Comments)    "Made me crazy"---caused mood changes    PAST MEDICAL HISTORY Past Medical History:  Diagnosis Date  . Acute pancreatitis January 2014  . Chronic back pain   . Chronic fatigue   . Compression fracture of vertebral column ([place])   . Headache    PMH  . HTN (hypertension)   . Hypercholesteremia   . Macular hole   . Mild aortic stenosis   . MRSA (methicillin resistant Staphylococcus aureus)   . Osteoporosis   . Ovarian cyst   . RBBB   . Staph infection    Past Surgical History:  Procedure Laterality Date  . ABDOMINAL HYSTERECTOMY    . APPENDECTOMY    . athroscopy Right shoulder    . Biopsy Left Breast x 2    . CESAREAN SECTION    . CHOLECYSTECTOMY    . PARS PLANA VITRECTOMY Left 10/09/2016   Procedure: PARS PLANA VITRECTOMY WITH 25 GAUGE;  Surgeon: Bernarda Caffey, MD;  Location: [place];  Service: Ophthalmology;  Laterality: Left;  . PARS PLANA VITRECTOMY W/ REPAIR OF MACULAR HOLE Right 2015    FAMILY HISTORY Family History  Problem Relation Age of Onset  . Cataracts Mother   . Heart disease Brother   . Heart disease Brother   . Cancer Unknown   . Heart disease Unknown   . Amblyopia Neg Hx   . Blindness Neg Hx   . Glaucoma Neg Hx   . Macular degeneration Neg Hx   . Retinal detachment Neg Hx   . Strabismus Neg Hx   . Retinitis pigmentosa Neg Hx     SOCIAL HISTORY Social History   Tobacco Use  . Smoking status: Never Smoker  . Smokeless tobacco: Never Used  Substance Use Topics  . Alcohol use: Yes    Comment: glass of wine infrequently  . Drug use: No         OPHTHALMIC EXAM:  Base Eye Exam    Visual Acuity (Snellen - Linear)      Right Left   Dist [place] 20/40 -1 20/80 -2   Dist ph [place] 20/40 NI       Tonometry (Tonopen, 9:27 AM)      Right Left    Pressure 18 20       Pupils      Dark Light Shape React APD   Right 3 2 Round Brisk None   Left 3 2 Round Brisk None       Visual Fields (Counting fingers)      Left Right    Full Full       Extraocular  Movement      Right Left    Full, Ortho Full, Ortho       Neuro/Psych    Oriented x3:  Yes   Mood/Affect:  Normal       Dilation    Both eyes:  1.0% Mydriacyl, 2.5% Phenylephrine @ 9:27 AM        Slit Lamp and Fundus Exam    External Exam      Right Left   External Normal Normal       Slit Lamp Exam      Right Left   Lids/Lashes Dermatochalasis - upper lid Dermatochalasis - upper lid   Conjunctiva/Sclera White and quiet White and quiet   Cornea Inferior 2+ Punctate epithelial erosions Clear   Anterior Chamber Deep and quiet Deep and quiet   Iris Round; dilated Round; dilated   Lens Posterior chamber intraocular lens, open Posterior capsular opacification Posterior chamber intraocular lens, open PC   Vitreous Post Vitrectomy  post vitrectomy; gas bubble gone       Fundus Exam      Right Left   Disc Normal Mild PPA   C/D Ratio 0.3 0.25   Macula RPE changes; closed macular hole Macular hole closed; RPE mottling and clumping centrally; small RPE clump superior to fovea   Vessels Vascular attenuation - mild Vascular attenuation - mild   Periphery Flat, attached, reticular degeneration Flat, attached, laser changes surrounding small tear 1200; reticular degeneration          IMAGING AND PROCEDURES  Imaging and Procedures for 04/09/17  OCT, Retina - OU - Both Eyes     Right Eye Quality was good. Central Foveal Thickness: 225. Progression has been stable. Findings include abnormal foveal contour, no IRF, no SRF (Closed macular hole; scattered ORA, irregular inner retinal contour temporally).   Left Eye Quality was good. Central Foveal Thickness: 217. Progression has been stable. Findings include no IRF, no SRF (Mac hole closed; central ORA / ellipsoid  disruption -- improved from prior).   Notes Images taken, stored on drive   Diagnosis / Impression:  OD: closed macular hole with focal ORA; no IRF/SRF OS: closed macular hole with central ORA and ellipsoid disruption improved from prior; no IRF/SRF  Clinical management:  See below  Abbreviations: NFP - Normal foveal profile. CME - cystoid macular edema. PED - pigment epithelial detachment. IRF - intraretinal fluid. SRF - subretinal fluid. EZ - ellipsoid zone. ERM - epiretinal membrane. ORA - outer retinal atrophy. ORT - outer retinal tubulation. SRHM - subretinal hyper-reflective material                  ASSESSMENT/PLAN:    ICD-10-CM   1. Macular hole of left eye s/p PPV/MP/12%C3F8 OS, 8.29.18 H35.342   2. Macular hole, right H35.341 OCT, Retina - OU - Both Eyes  3. Pseudophakia of both eyes Z96.1   4. PCO (posterior capsular opacification), right H26.491   5. Macular hole of left eye H35.342 OCT, Retina - OU - Both Eyes    1. Full-thickness macular hole, left eye.   - POM6 s/p PPV/ICG/MP/EL/FAX/12% C3F8 OS, 8.29.18             - doing well -- VA 20/80-2             - macular hole closed; retina in good position  - central ellipsoid improving on OCT  - discussed OCT findings, prognosis and recovery             -  IOP 20 today  - can resume full activity  - F/U 6 mos  2. History of macular hole, right eye - s/p mac hole repair in 2015 -- Dr. Starling Manns - hole closed, stable, VA 20/40 - monitor  3,4. Pseudophakia w/ mild PCO OD - PCIOL in good position OU - now s/p YAG capsulotomy w/ Dr. Ellie Lunch -- nice clearing in [facility]     Ophthalmic Meds Ordered this visit:  No orders of the defined types were placed in this encounter.      Return in about 6 months (around 10/07/2017) for F/U [place] OS; S/P PPV.  There are no Patient Instructions on file for this visit.   Explained the diagnoses, plan, and follow up with the patient and they expressed understanding.   Patient expressed understanding of the importance of proper follow up care.   This document serves as a record of services personally performed by Gardiner Sleeper, MD, PhD. It was created on their behalf by Catha Brow, [place], a certified ophthalmic assistant. The creation of this record is the provider's dictation and/or activities during the visit.  Electronically signed by: Catha Brow, [place]  04/09/17 10:29 AM    Gardiner Sleeper, M.D., Ph.D. Diseases & Surgery of the Retina and Vitreous [facility] 04/09/17   I have reviewed the above documentation for accuracy and completeness, and I agree with the above. Gardiner Sleeper, M.D., Ph.D. 04/09/17 10:29 AM     Abbreviations: M myopia (nearsighted); A astigmatism; H hyperopia (farsighted); P presbyopia; Mrx spectacle prescription;  CTL contact lenses; OD right eye; OS left eye; OU both eyes  XT exotropia; ET esotropia; PEK punctate epithelial keratitis; PEE punctate epithelial erosions; DES dry eye syndrome; MGD meibomian gland dysfunction; ATs artificial tears; PFAT's preservative free artificial tears; [place] nuclear sclerotic cataract; PSC posterior subcapsular cataract; ERM epi-retinal membrane; PVD posterior vitreous detachment; RD retinal detachment; DM diabetes mellitus; DR diabetic retinopathy; NPDR non-proliferative diabetic retinopathy; PDR proliferative diabetic retinopathy; CSME clinically significant macular edema; DME diabetic macular edema; dbh dot blot hemorrhages; CWS cotton wool spot; POAG primary open angle glaucoma; C/D cup-to-disc ratio; HVF humphrey visual field; GVF goldmann visual field; OCT optical coherence tomography; IOP intraocular pressure; BRVO Branch retinal vein occlusion; CRVO central retinal vein occlusion; CRAO central retinal artery occlusion; BRAO branch retinal artery occlusion; RT retinal tear; SB scleral buckle; PPV pars plana vitrectomy; VH Vitreous hemorrhage; PRP panretinal laser  photocoagulation; IVK intravitreal kenalog; VMT vitreomacular traction; MH Macular hole;  NVD neovascularization of the disc; NVE neovascularization elsewhere; AREDS age related eye disease study; ARMD age related macular degeneration; POAG primary open angle glaucoma; EBMD epithelial/anterior basement membrane dystrophy; ACIOL anterior chamber intraocular lens; IOL intraocular lens; PCIOL posterior chamber intraocular lens; Phaco/IOL phacoemulsification with intraocular lens placement; [place] photorefractive keratectomy; LASIK laser assisted in situ keratomileusis; HTN hypertension; DM diabetes mellitus; COPD chronic obstructive pulmonary disease

## 2017-04-08 DIAGNOSIS — E781 Pure hyperglyceridemia: Secondary | ICD-10-CM | POA: Diagnosis not present

## 2017-04-08 DIAGNOSIS — I10 Essential (primary) hypertension: Secondary | ICD-10-CM | POA: Diagnosis not present

## 2017-04-08 DIAGNOSIS — G458 Other transient cerebral ischemic attacks and related syndromes: Secondary | ICD-10-CM | POA: Diagnosis not present

## 2017-04-08 DIAGNOSIS — D638 Anemia in other chronic diseases classified elsewhere: Secondary | ICD-10-CM | POA: Diagnosis not present

## 2017-04-08 DIAGNOSIS — F321 Major depressive disorder, single episode, moderate: Secondary | ICD-10-CM | POA: Diagnosis not present

## 2017-04-08 DIAGNOSIS — Z6828 Body mass index (BMI) 28.0-28.9, adult: Secondary | ICD-10-CM | POA: Diagnosis not present

## 2017-04-08 DIAGNOSIS — M810 Age-related osteoporosis without current pathological fracture: Secondary | ICD-10-CM | POA: Diagnosis not present

## 2017-04-08 DIAGNOSIS — M546 Pain in thoracic spine: Secondary | ICD-10-CM | POA: Diagnosis not present

## 2017-04-08 DIAGNOSIS — E559 Vitamin D deficiency, unspecified: Secondary | ICD-10-CM | POA: Diagnosis not present

## 2017-04-08 DIAGNOSIS — H3530 Unspecified macular degeneration: Secondary | ICD-10-CM | POA: Diagnosis not present

## 2017-04-08 DIAGNOSIS — M1990 Unspecified osteoarthritis, unspecified site: Secondary | ICD-10-CM | POA: Diagnosis not present

## 2017-04-08 DIAGNOSIS — R600 Localized edema: Secondary | ICD-10-CM | POA: Diagnosis not present

## 2017-04-09 ENCOUNTER — Encounter (INDEPENDENT_AMBULATORY_CARE_PROVIDER_SITE_OTHER): Payer: Self-pay | Admitting: Ophthalmology

## 2017-04-09 ENCOUNTER — Ambulatory Visit (INDEPENDENT_AMBULATORY_CARE_PROVIDER_SITE_OTHER): Payer: Medicare Other | Admitting: Ophthalmology

## 2017-04-09 DIAGNOSIS — H35342 Macular cyst, hole, or pseudohole, left eye: Secondary | ICD-10-CM | POA: Diagnosis not present

## 2017-04-09 DIAGNOSIS — H26491 Other secondary cataract, right eye: Secondary | ICD-10-CM

## 2017-04-09 DIAGNOSIS — Z961 Presence of intraocular lens: Secondary | ICD-10-CM | POA: Diagnosis not present

## 2017-04-09 DIAGNOSIS — H35341 Macular cyst, hole, or pseudohole, right eye: Secondary | ICD-10-CM | POA: Diagnosis not present

## 2017-05-01 ENCOUNTER — Ambulatory Visit (HOSPITAL_COMMUNITY)
Admission: RE | Admit: 2017-05-01 | Discharge: 2017-05-01 | Disposition: A | Discharge status: Home / Self Care | Payer: Medicare Other | Source: Ambulatory Visit | Attending: Internal Medicine | Admitting: Internal Medicine

## 2017-05-01 ENCOUNTER — Encounter (HOSPITAL_COMMUNITY): Payer: Self-pay

## 2017-05-01 DIAGNOSIS — M810 Age-related osteoporosis without current pathological fracture: Secondary | ICD-10-CM | POA: Diagnosis not present

## 2017-05-01 MED ORDER — SODIUM CHLORIDE 0.9 % IV SOLN: Freq: Once | INTRAVENOUS | Status: DC

## 2017-05-01 MED ORDER — ZOLEDRONIC ACID 5 MG/100ML IV SOLN
5.0000 mg | Freq: Once | INTRAVENOUS | Status: AC
  Administered 2017-05-01: 5 mg via INTRAVENOUS
  Filled 2017-05-01: qty 100

## 2017-05-01 NOTE — Discharge Instructions (Signed)
Drink  Fluids/water as tolerated over the next 72 hours Tylenol or ibuprofen OTC as directed if needed for aches and pains next few days Continue Calcium and Vit D as directed by your MD  Call 911 if any emergencies  Otherwise call Dr. Osborne Casco with any problems or questions   Reclast   Zoledronic Acid injection (Paget's Disease, Osteoporosis) What is this medicine? ZOLEDRONIC ACID (ZOE le dron ik AS id) lowers the amount of calcium loss from bone. It is used to treat Paget's disease and osteoporosis in women. This medicine may be used for other purposes; ask your health care provider or pharmacist if you have questions. COMMON BRAND NAME(S): Reclast, Zometa What should I tell my health care provider before I take this medicine? They need to know if you have any of these conditions: -aspirin-sensitive asthma -cancer, especially if you are receiving medicines used to treat cancer -dental disease or wear dentures -infection -kidney disease -low levels of calcium in the blood -past surgery on the parathyroid gland or intestines -receiving corticosteroids like dexamethasone or prednisone -an unusual or allergic reaction to zoledronic acid, other medicines, foods, dyes, or preservatives -pregnant or trying to get pregnant -breast-feeding How should I use this medicine? This medicine is for infusion into a vein. It is given by a health care professional in a hospital or clinic setting. Talk to your pediatrician regarding the use of this medicine in children. This medicine is not approved for use in children. Overdosage: If you think you have taken too much of this medicine contact a poison control center or emergency room at once. NOTE: This medicine is only for you. Do not share this medicine with others. What if I miss a dose? It is important not to miss your dose. Call your doctor or health care professional if you are unable to keep an appointment. What may interact with this  medicine? -certain antibiotics given by injection -NSAIDs, medicines for pain and inflammation, like ibuprofen or naproxen -some diuretics like bumetanide, furosemide -teriparatide This list may not describe all possible interactions. Give your health care provider a list of all the medicines, herbs, non-prescription drugs, or dietary supplements you use. Also tell them if you smoke, drink alcohol, or use illegal drugs. Some items may interact with your medicine. What should I watch for while using this medicine? Visit your doctor or health care professional for regular checkups. It may be some time before you see the benefit from this medicine. Do not stop taking your medicine unless your doctor tells you to. Your doctor may order blood tests or other tests to see how you are doing. Women should inform their doctor if they wish to become pregnant or think they might be pregnant. There is a potential for serious side effects to an unborn child. Talk to your health care professional or pharmacist for more information. You should make sure that you get enough calcium and vitamin D while you are taking this medicine. Discuss the foods you eat and the vitamins you take with your health care professional. Some people who take this medicine have severe bone, joint, and/or muscle pain. This medicine may also increase your risk for jaw problems or a broken thigh bone. Tell your doctor right away if you have severe pain in your jaw, bones, joints, or muscles. Tell your doctor if you have any pain that does not go away or that gets worse. Tell your dentist and dental surgeon that you are taking this medicine. You  should not have major dental surgery while on this medicine. See your dentist to have a dental exam and fix any dental problems before starting this medicine. Take good care of your teeth while on this medicine. Make sure you see your dentist for regular follow-up appointments. What side effects may I  notice from receiving this medicine? Side effects that you should report to your doctor or health care professional as soon as possible: -allergic reactions like skin rash, itching or hives, swelling of the face, lips, or tongue -anxiety, confusion, or depression -breathing problems -changes in vision -eye pain -feeling faint or lightheaded, falls -jaw pain, especially after dental work -mouth sores -muscle cramps, stiffness, or weakness -redness, blistering, peeling or loosening of the skin, including inside the mouth -trouble passing urine or change in the amount of urine Side effects that usually do not require medical attention (report to your doctor or health care professional if they continue or are bothersome): -bone, joint, or muscle pain -constipation -diarrhea -fever -hair loss -irritation at site where injected -loss of appetite -nausea, vomiting -stomach upset -trouble sleeping -trouble swallowing -weak or tired This list may not describe all possible side effects. Call your doctor for medical advice about side effects. You may report side effects to FDA at 1-800-FDA-1088. Where should I keep my medicine? This drug is given in a hospital or clinic and will not be stored at home. NOTE: This sheet is a summary. It may not cover all possible information. If you have questions about this medicine, talk to your doctor, pharmacist, or health care provider.  2018 Elsevier/Gold Standard (2013-06-26 14:19:57)

## 2017-07-14 DIAGNOSIS — M546 Pain in thoracic spine: Secondary | ICD-10-CM | POA: Diagnosis not present

## 2017-07-14 DIAGNOSIS — I10 Essential (primary) hypertension: Secondary | ICD-10-CM | POA: Diagnosis not present

## 2017-07-14 DIAGNOSIS — M810 Age-related osteoporosis without current pathological fracture: Secondary | ICD-10-CM | POA: Diagnosis not present

## 2017-07-14 DIAGNOSIS — E875 Hyperkalemia: Secondary | ICD-10-CM | POA: Diagnosis not present

## 2017-07-14 DIAGNOSIS — M1990 Unspecified osteoarthritis, unspecified site: Secondary | ICD-10-CM | POA: Diagnosis not present

## 2017-07-14 DIAGNOSIS — Z6828 Body mass index (BMI) 28.0-28.9, adult: Secondary | ICD-10-CM | POA: Diagnosis not present

## 2017-07-14 DIAGNOSIS — Z1389 Encounter for screening for other disorder: Secondary | ICD-10-CM | POA: Diagnosis not present

## 2017-07-14 DIAGNOSIS — D638 Anemia in other chronic diseases classified elsewhere: Secondary | ICD-10-CM | POA: Diagnosis not present

## 2017-07-14 DIAGNOSIS — F321 Major depressive disorder, single episode, moderate: Secondary | ICD-10-CM | POA: Diagnosis not present

## 2017-10-06 NOTE — Progress Notes (Signed)
[facility] Note  10/07/2017     CHIEF COMPLAINT Patient presents for Retina Follow Up   HISTORY OF PRESENT ILLNESS: SEMAYA Jensen is a 84 y.o. female who presents to the clinic today for:   HPI    Retina Follow Up    Patient presents with  Other.  In left eye.  This started 1 year ago.  Severity is mild.  Since onset it is gradually worsening.  I, the attending physician,  performed the HPI with the patient and updated documentation appropriately.          Comments    F/U [facility] OS. S/P PPV/MP/EL/FAX 12% C3F8 OS(10/09/17).Patient states her vision is "getting much worse, I can't read, I can't read road sign up close like I once could ". "[place] did help my vision but not like I want it too". "I need new Rx glasses" Pt states her OS is watering a lot. Pt is using Systane PRN       Last edited by Bernarda Caffey, MD on 10/07/2017  9:44 AM. (History)      Referring physician: Haywood Pao, MD [address], [place] [address]  HISTORICAL INFORMATION:   Selected notes from the [place] Referral from Dr. Ellie Lunch for mac hole OS S/p PPV/ICG/MP/EL/FAX/12% C3F8 OS, 8.29.18 Past ocular history of macular hole OD s/p repair in 2015 w/ [facility]     CURRENT MEDICATIONS: Current Outpatient Medications (Ophthalmic Drugs)  Medication Sig  . Polyethyl Glycol-Propyl Glycol (SYSTANE ULTRA) 0.4-0.3 % SOLN Place 1-2 drops into both eyes 4 (four) times daily as needed (for dry eyes).  . prednisoLONE acetate (PRED FORTE) 1 % ophthalmic suspension Place 1 drop into the left eye 4 (four) times daily.   No current facility-administered medications for this visit.  (Ophthalmic Drugs)   Current Outpatient Medications (Other)  Medication Sig  . amLODipine (NORVASC) 5 MG tablet Take 1 tablet (5 mg total) by mouth daily.  Marland Kitchen aspirin EC 81 MG tablet Take 1 tablet (81 mg total) by mouth daily.  . calcium-vitamin D (OSCAL WITH D) 500-200 MG-UNIT per tablet  Take 1 tablet by mouth daily.  . Cholecalciferol (VITAMIN D3) 2000 units TABS Take 2,000 Units by mouth daily.  Marland Kitchen GINKGO BILOBA PO Take 1 tablet by mouth daily.  . Glucosamine-Chondroitin (COSAMIN DS PO) Take 1 tablet by mouth 2 (two) times daily.  . iron polysaccharides (FERREX 150) 150 MG capsule Take 150 mg by mouth daily.  Marland Kitchen losartan (COZAAR) 100 MG tablet Take 1 tablet (100 mg total) by mouth daily.  . metoprolol tartrate (LOPRESSOR) 25 MG tablet Take 12.5 mg by mouth 2 (two) times daily.  . Multiple Vitamin (MULTIVITAMIN WITH MINERALS) TABS Take 1 tablet by mouth daily. Centrum Silver  . naproxen sodium (ANAPROX) 220 MG tablet Take 220 mg by mouth 2 (two) times daily as needed (for pain.).  Marland Kitchen pantoprazole (PROTONIX) 40 MG tablet Take 1 tablet (40 mg total) by mouth daily.   No current facility-administered medications for this visit.  (Other)      REVIEW OF SYSTEMS: ROS    Positive for: Musculoskeletal, Eyes   Negative for: Constitutional, Gastrointestinal, Neurological, Skin, Genitourinary, HENT, Endocrine, Cardiovascular, Respiratory, Psychiatric, Allergic/Imm, Heme/Lymph   Last edited by Zenovia Jordan, LPN on 0/10/3816  2:99 AM. (History)       ALLERGIES Allergies  Allergen Reactions  . Penicillins Rash and Other (See Comments)    PATIENT HAS HAD A PCN REACTION WITH  IMMEDIATE RASH, FACIAL/TONGUE/THROAT SWELLING, SOB, OR LIGHTHEADEDNESS WITH HYPOTENSION:  #  #  #  YES  #  #  #   Has patient had a PCN reaction causing severe rash involving mucus membranes or skin necrosis: No Has patient had a PCN reaction that required hospitalization: No Has patient had a PCN reaction occurring within the last 10 years: No If all of the above answers are "NO", then may proceed with Cephalosporin use.   . Codeine Nausea Only  . Tramadol Hcl Other (See Comments)    "Made me crazy"---caused mood changes    PAST MEDICAL HISTORY Past Medical History:  Diagnosis Date  . Acute  pancreatitis January 2014  . Chronic back pain   . Chronic fatigue   . Compression fracture of vertebral column ([place])   . Headache    PMH  . HTN (hypertension)   . Hypercholesteremia   . Macular hole   . Mild aortic stenosis   . MRSA (methicillin resistant Staphylococcus aureus)   . Osteoporosis   . Ovarian cyst   . RBBB   . Staph infection    Past Surgical History:  Procedure Laterality Date  . ABDOMINAL HYSTERECTOMY    . APPENDECTOMY    . athroscopy Right shoulder    . Biopsy Left Breast x 2    . CESAREAN SECTION    . CHOLECYSTECTOMY    . PARS PLANA VITRECTOMY Left 10/09/2016   Procedure: PARS PLANA VITRECTOMY WITH 25 GAUGE;  Surgeon: Bernarda Caffey, MD;  Location: [facility];  Service: Ophthalmology;  Laterality: Left;  . PARS PLANA VITRECTOMY W/ REPAIR OF MACULAR HOLE Right 2015    FAMILY HISTORY Family History  Problem Relation Age of Onset  . Cataracts Mother   . Heart disease Brother   . Heart disease Brother   . Cancer Unknown   . Heart disease Unknown   . Amblyopia Neg Hx   . Blindness Neg Hx   . Glaucoma Neg Hx   . Macular degeneration Neg Hx   . Retinal detachment Neg Hx   . Strabismus Neg Hx   . Retinitis pigmentosa Neg Hx     SOCIAL HISTORY Social History   Tobacco Use  . Smoking status: Never Smoker  . Smokeless tobacco: Never Used  Substance Use Topics  . Alcohol use: Yes    Comment: glass of wine infrequently  . Drug use: No         OPHTHALMIC EXAM:  Base Eye Exam    Visual Acuity (Snellen - Linear)      Right Left   Dist [place] 20/40 -2 20/100   Dist ph [place] NI 20/80 +2       Tonometry (Tonopen, 9:14 AM)      Right Left   Pressure 18 10       Pupils      Dark Light Shape React APD   Right 3 2 Round Slow None   Left 3 2 Round Slow None       Visual Fields (Counting fingers)      Left Right    Full Full       Extraocular Movement      Right Left    Full, Ortho Full, Ortho       Neuro/Psych    Oriented x3:  Yes    Mood/Affect:  Normal       Dilation    Both eyes:  1.0% Mydriacyl, 2.5% Phenylephrine @ 9:14 AM  Slit Lamp and Fundus Exam    External Exam      Right Left   External Normal Normal       Slit Lamp Exam      Right Left   Lids/Lashes Dermatochalasis - upper lid Dermatochalasis - upper lid   Conjunctiva/Sclera White and quiet White and quiet   Cornea Inferior 2-3+ Punctate epithelial erosions 1+ Punctate epithelial erosions, Arcus   Anterior Chamber Deep and quiet Deep and quiet   Iris Round; dilated Round; dilated   Lens Posterior chamber intraocular lens, open Posterior capsular Posterior chamber intraocular lens, open PC   Vitreous Post Vitrectomy  post vitrectomy       Fundus Exam      Right Left   Disc Mild Pallor Mild PPA inferiorly and temporally, mild Pallor   C/D Ratio 0.4 0.3   Macula Blunted foveal reflex, RPE changes; closed macular hole, No heme or edema Macular hole closed; RPE mottling and clumping centrally; small RPE clump superior to fovea   Vessels Vascular attenuation - mild Vascular attenuation - mild   Periphery Flat, attached, reticular degeneration Flat, attached, laser changes surrounding small tear 1200; reticular degeneration          IMAGING AND PROCEDURES  Imaging and Procedures for 04/09/17  OCT, Retina - OU - Both Eyes       Right Eye Quality was good. Central Foveal Thickness: 228. Progression has been stable. Findings include abnormal foveal contour, no IRF, no SRF (Closed macular hole; scattered ORA, irregular inner retinal contour temporally).   Left Eye Quality was good. Central Foveal Thickness: 213. Progression has been stable. Findings include no IRF, no SRF (Mac hole closed; central ORA / ellipsoid disruption -- improved from prior, irregular inner retinal contour temporally).   Notes Images taken, stored on drive   Diagnosis / Impression:  OD: closed macular hole with focal ORA; no IRF/SRF OS: closed macular hole with  central ORA and ellipsoid disruption improved from prior; no IRF/SRF  Clinical management:  See below  Abbreviations: NFP - Normal foveal profile. CME - cystoid macular edema. PED - pigment epithelial detachment. IRF - intraretinal fluid. SRF - subretinal fluid. EZ - ellipsoid zone. ERM - epiretinal membrane. ORA - outer retinal atrophy. ORT - outer retinal tubulation. SRHM - subretinal hyper-reflective material                  ASSESSMENT/PLAN:    ICD-10-CM   1. Macular hole of left eye s/p PPV/MP/12%C3F8 OS, 8.29.18 H35.342   2. Macular hole, right H35.341 OCT, Retina - OU - Both Eyes  3. Pseudophakia of both eyes Z96.1   4. Macular hole of left eye H35.342 OCT, Retina - OU - Both Eyes    1. Full-thickness macular hole, left eye.   - s/p PPV/ICG/MP/EL/FAX/12% C3F8 OS, 8.29.18             - VA 20/80+2             - macular hole closed; retina in good position  - central ellipsoid mildly improved on OCT  - discussed OCT findings, prognosis and recovery             - IOP 10 today  - F/U 6 months  2. History of macular hole, right eye - s/p mac hole repair in 2015 -- Dr. Starling Manns - hole closed, stable, VA 20/40 - monitor  3. Pseudophakia OU - PCIOL in good position OU - s/p YAG capsulotomy w/ Dr.  McCuen     Ophthalmic Meds Ordered this visit:  No orders of the defined types were placed in this encounter.      Return in about 6 months (around 04/09/2018) for F/U mac hole repair OS, DFE, OCT.  There are no Patient Instructions on file for this visit.   Explained the diagnoses, plan, and follow up with the patient and they expressed understanding.  Patient expressed understanding of the importance of proper follow up care.   This document serves as a record of services personally performed by Gardiner Sleeper, MD, PhD. It was created on their behalf by Catha Brow, [place], a certified ophthalmic assistant. The creation of this record is the provider's dictation  and/or activities during the visit.  Electronically signed by: Catha Brow, COA  08.26.19 10:09 AM    Gardiner Sleeper, M.D., Ph.D. Diseases & Surgery of the Retina and Vitreous [facility]   I have reviewed the above documentation for accuracy and completeness, and I agree with the above. Gardiner Sleeper, M.D., Ph.D. 10/07/17 10:09 AM    Abbreviations: M myopia (nearsighted); A astigmatism; H hyperopia (farsighted); P presbyopia; Mrx spectacle prescription;  CTL contact lenses; OD right eye; OS left eye; OU both eyes  XT exotropia; ET esotropia; PEK punctate epithelial keratitis; PEE punctate epithelial erosions; DES dry eye syndrome; MGD meibomian gland dysfunction; ATs artificial tears; PFAT's preservative free artificial tears; [place] nuclear sclerotic cataract; PSC posterior subcapsular cataract; ERM epi-retinal membrane; PVD posterior vitreous detachment; RD retinal detachment; DM diabetes mellitus; DR diabetic retinopathy; NPDR non-proliferative diabetic retinopathy; PDR proliferative diabetic retinopathy; CSME clinically significant macular edema; DME diabetic macular edema; dbh dot blot hemorrhages; CWS cotton wool spot; POAG primary open angle glaucoma; C/D cup-to-disc ratio; HVF humphrey visual field; GVF goldmann visual field; OCT optical coherence tomography; IOP intraocular pressure; BRVO Branch retinal vein occlusion; CRVO central retinal vein occlusion; CRAO central retinal artery occlusion; BRAO branch retinal artery occlusion; RT retinal tear; SB scleral buckle; PPV pars plana vitrectomy; VH Vitreous hemorrhage; PRP panretinal laser photocoagulation; IVK intravitreal kenalog; VMT vitreomacular traction; MH Macular hole;  NVD neovascularization of the disc; NVE neovascularization elsewhere; AREDS age related eye disease study; ARMD age related macular degeneration; POAG primary open angle glaucoma; EBMD epithelial/anterior basement membrane dystrophy; ACIOL  anterior chamber intraocular lens; IOL intraocular lens; PCIOL posterior chamber intraocular lens; Phaco/IOL phacoemulsification with intraocular lens placement; [place] photorefractive keratectomy; LASIK laser assisted in situ keratomileusis; HTN hypertension; DM diabetes mellitus; COPD chronic obstructive pulmonary disease

## 2017-10-07 ENCOUNTER — Ambulatory Visit (INDEPENDENT_AMBULATORY_CARE_PROVIDER_SITE_OTHER): Payer: Medicare Other | Admitting: Ophthalmology

## 2017-10-07 ENCOUNTER — Encounter (INDEPENDENT_AMBULATORY_CARE_PROVIDER_SITE_OTHER): Payer: Self-pay | Admitting: Ophthalmology

## 2017-10-07 DIAGNOSIS — H35341 Macular cyst, hole, or pseudohole, right eye: Secondary | ICD-10-CM

## 2017-10-07 DIAGNOSIS — H35342 Macular cyst, hole, or pseudohole, left eye: Secondary | ICD-10-CM | POA: Diagnosis not present

## 2017-10-07 DIAGNOSIS — Z961 Presence of intraocular lens: Secondary | ICD-10-CM | POA: Diagnosis not present

## 2017-10-22 DIAGNOSIS — M546 Pain in thoracic spine: Secondary | ICD-10-CM | POA: Diagnosis not present

## 2017-10-22 DIAGNOSIS — E781 Pure hyperglyceridemia: Secondary | ICD-10-CM | POA: Diagnosis not present

## 2017-10-22 DIAGNOSIS — Z6828 Body mass index (BMI) 28.0-28.9, adult: Secondary | ICD-10-CM | POA: Diagnosis not present

## 2017-10-22 DIAGNOSIS — I10 Essential (primary) hypertension: Secondary | ICD-10-CM | POA: Diagnosis not present

## 2017-10-22 DIAGNOSIS — E875 Hyperkalemia: Secondary | ICD-10-CM | POA: Diagnosis not present

## 2017-10-22 DIAGNOSIS — R600 Localized edema: Secondary | ICD-10-CM | POA: Diagnosis not present

## 2017-10-22 DIAGNOSIS — T148XXS Other injury of unspecified body region, sequela: Secondary | ICD-10-CM | POA: Diagnosis not present

## 2017-10-22 DIAGNOSIS — M810 Age-related osteoporosis without current pathological fracture: Secondary | ICD-10-CM | POA: Diagnosis not present

## 2017-11-15 DIAGNOSIS — Z23 Encounter for immunization: Secondary | ICD-10-CM | POA: Diagnosis not present

## 2018-01-27 DIAGNOSIS — Z6829 Body mass index (BMI) 29.0-29.9, adult: Secondary | ICD-10-CM | POA: Diagnosis not present

## 2018-01-27 DIAGNOSIS — R809 Proteinuria, unspecified: Secondary | ICD-10-CM | POA: Diagnosis not present

## 2018-01-27 DIAGNOSIS — Z1389 Encounter for screening for other disorder: Secondary | ICD-10-CM | POA: Diagnosis not present

## 2018-01-27 DIAGNOSIS — Z01419 Encounter for gynecological examination (general) (routine) without abnormal findings: Secondary | ICD-10-CM | POA: Diagnosis not present

## 2018-02-10 DIAGNOSIS — I10 Essential (primary) hypertension: Secondary | ICD-10-CM | POA: Diagnosis not present

## 2018-02-10 DIAGNOSIS — R600 Localized edema: Secondary | ICD-10-CM | POA: Diagnosis not present

## 2018-02-10 DIAGNOSIS — Z6827 Body mass index (BMI) 27.0-27.9, adult: Secondary | ICD-10-CM | POA: Diagnosis not present

## 2018-02-10 DIAGNOSIS — R05 Cough: Secondary | ICD-10-CM | POA: Diagnosis not present

## 2018-02-10 DIAGNOSIS — J189 Pneumonia, unspecified organism: Secondary | ICD-10-CM | POA: Diagnosis not present

## 2018-02-10 NOTE — Progress Notes (Deleted)
[name] Date of Birth: [birth year] Medical Record #[id number]  History of Present Illness: Mallory Jensen is seen for follow up of HTN. She has a history of hypertension, hyperlipidemia, and right bundle branch block.   She has done well over this past year from a cardiac standpoint. She denies any chest pain or SOB.  She is more sedentary but still lives alone and still drives. She still enjoys cooking and entertaining. She does note some chronic back pain.  Current Outpatient Medications on File Prior to Visit  Medication Sig Dispense Refill  . amLODipine (NORVASC) 5 MG tablet Take 1 tablet (5 mg total) by mouth daily.    Marland Kitchen aspirin EC 81 MG tablet Take 1 tablet (81 mg total) by mouth daily. 90 tablet 3  . calcium-vitamin D (OSCAL WITH D) 500-200 MG-UNIT per tablet Take 1 tablet by mouth daily.    . Cholecalciferol (VITAMIN D3) 2000 units TABS Take 2,000 Units by mouth daily.    Marland Kitchen GINKGO BILOBA PO Take 1 tablet by mouth daily.    . Glucosamine-Chondroitin (COSAMIN DS PO) Take 1 tablet by mouth 2 (two) times daily.    . iron polysaccharides (FERREX 150) 150 MG capsule Take 150 mg by mouth daily.    Marland Kitchen losartan (COZAAR) 100 MG tablet Take 1 tablet (100 mg total) by mouth daily.    . metoprolol tartrate (LOPRESSOR) 25 MG tablet Take 12.5 mg by mouth 2 (two) times daily.    . Multiple Vitamin (MULTIVITAMIN WITH MINERALS) TABS Take 1 tablet by mouth daily. Centrum Silver    . naproxen sodium (ANAPROX) 220 MG tablet Take 220 mg by mouth 2 (two) times daily as needed (for pain.).    Marland Kitchen pantoprazole (PROTONIX) 40 MG tablet Take 1 tablet (40 mg total) by mouth daily.    Vladimir Faster Glycol-Propyl Glycol (SYSTANE ULTRA) 0.4-0.3 % SOLN Place 1-2 drops into both eyes 4 (four) times daily as needed (for dry eyes).     No current facility-administered medications on file prior to visit.     Allergies  Allergen Reactions  . Penicillins Rash and Other (See Comments)    PATIENT HAS HAD A PCN  REACTION WITH IMMEDIATE RASH, FACIAL/TONGUE/THROAT SWELLING, SOB, OR LIGHTHEADEDNESS WITH HYPOTENSION:  #  #  #  YES  #  #  #   Has patient had a PCN reaction causing severe rash involving mucus membranes or skin necrosis: No Has patient had a PCN reaction that required hospitalization: No Has patient had a PCN reaction occurring within the last 10 years: No If all of the above answers are "NO", then may proceed with Cephalosporin use.   . Codeine Nausea Only  . Tramadol Hcl Other (See Comments)    "Made me crazy"---caused mood changes    Past Medical History:  Diagnosis Date  . Acute pancreatitis January 2014  . Chronic back pain   . Chronic fatigue   . Compression fracture of vertebral column ([place])   . Headache    PMH  . HTN (hypertension)   . Hypercholesteremia   . Macular hole   . Mild aortic stenosis   . MRSA (methicillin resistant Staphylococcus aureus)   . Osteoporosis   . Ovarian cyst   . RBBB   . Staph infection     Past Surgical History:  Procedure Laterality Date  . ABDOMINAL HYSTERECTOMY    . APPENDECTOMY    . athroscopy Right shoulder    . Biopsy Left Breast x 2    .  CESAREAN SECTION    . CHOLECYSTECTOMY    . PARS PLANA VITRECTOMY Left 10/09/2016   Procedure: PARS PLANA VITRECTOMY WITH 25 GAUGE;  Surgeon: Bernarda Caffey, MD;  Location: [facility];  Service: Ophthalmology;  Laterality: Left;  . PARS PLANA VITRECTOMY W/ REPAIR OF MACULAR HOLE Right 2015    Social History   Tobacco Use  Smoking Status Never Smoker  Smokeless Tobacco Never Used    Social History   Substance and Sexual Activity  Alcohol Use Yes   Comment: glass of wine infrequently    Family History  Problem Relation Age of Onset  . Cataracts Mother   . Heart disease Brother   . Heart disease Brother   . Cancer Unknown   . Heart disease Unknown   . Amblyopia Neg Hx   . Blindness Neg Hx   . Glaucoma Neg Hx   . Macular degeneration Neg Hx   . Retinal detachment Neg Hx   . Strabismus  Neg Hx   . Retinitis pigmentosa Neg Hx     Review of Systems: As noted in HPI.  All other systems were reviewed and are negative.  Physical Exam: There were no vitals taken for this visit. She is an elderly white female in no acute distress. Her HEENT exam is unremarkable. She has no JVD or bruits. Lungs are clear. Cardiac exam reveals a grade 1/6 systolic ejection murmur the right upper sternal border. There is no diastolic murmur or S3. Abdomen is obese, soft, nontender. She is kyphotic. She has no edema. Pedal pulses are good. She is alert and oriented x3. Cranial nerves II through XII are intact.  LABORATORY DATA: Dated 2.27.18: cholesterol 216, triglycerides 358, HDL 34, LDL 110.  Dated 07/11/16: BUN 26 creatinine 1.1, other chemistries normal. Dated 04/09/17: cholesterol 182, triglycerides 428, HDL 32, LDL 64. Hgb 10.6. Dated 10/23/16: creatinine 1.1.   ECG demonstrates normal sinus rhythm with an old right bundle branch block. Pulse is 56 beats per minute. First degree AV block. I have personally reviewed and interpreted this study.   Assessment / Plan: 1. HTN- BP is well controlled. . Continue current therapy.   2. RBBB chronic.  Follow up in one year.

## 2018-02-12 ENCOUNTER — Ambulatory Visit: Payer: Medicare Other | Admitting: Cardiology

## 2018-03-11 DIAGNOSIS — J189 Pneumonia, unspecified organism: Secondary | ICD-10-CM | POA: Diagnosis not present

## 2018-03-11 DIAGNOSIS — Z6827 Body mass index (BMI) 27.0-27.9, adult: Secondary | ICD-10-CM | POA: Diagnosis not present

## 2018-03-11 DIAGNOSIS — I10 Essential (primary) hypertension: Secondary | ICD-10-CM | POA: Diagnosis not present

## 2018-03-24 NOTE — Progress Notes (Signed)
Mallory Jensen Date of Birth: 07-07-1922 Medical Record #[id number]  History of Present Illness: Mallory Jensen is seen for follow up of HTN. She has a history of hypertension, hyperlipidemia, and right bundle branch block.   She reports that she had double PNA in January but has recovered well from that. No Dyspnea, cough, fever, chest pain now. Biggest complaint is of left carpal tunnel syndrome. She has some chronic edema.   Current Outpatient Medications on File Prior to Visit  Medication Sig Dispense Refill  . amLODipine (NORVASC) 5 MG tablet Take 1 tablet (5 mg total) by mouth daily.    Marland Kitchen aspirin EC 81 MG tablet Take 1 tablet (81 mg total) by mouth daily. 90 tablet 3  . calcium-vitamin D (OSCAL WITH D) 500-200 MG-UNIT per tablet Take 1 tablet by mouth daily.    . Cholecalciferol (VITAMIN D3) 2000 units TABS Take 2,000 Units by mouth daily.    . furosemide (LASIX) 20 MG tablet TAKE 1-2 TABLETS ONCE DAILY AS NEEDED FOR FLUID RETENTION    . GINKGO BILOBA PO Take 1 tablet by mouth daily.    . Glucosamine-Chondroitin (COSAMIN DS PO) Take 1 tablet by mouth 2 (two) times daily.    . iron polysaccharides (FERREX 150) 150 MG capsule Take 150 mg by mouth daily.    Marland Kitchen losartan (COZAAR) 100 MG tablet Take 1 tablet (100 mg total) by mouth daily.    . metoprolol tartrate (LOPRESSOR) 25 MG tablet Take 12.5 mg by mouth 2 (two) times daily.    . Multiple Vitamin (MULTIVITAMIN WITH MINERALS) TABS Take 1 tablet by mouth daily. Centrum Silver    . naproxen sodium (ANAPROX) 220 MG tablet Take 220 mg by mouth 2 (two) times daily as needed (for pain.).    Marland Kitchen pantoprazole (PROTONIX) 40 MG tablet Take 1 tablet (40 mg total) by mouth daily.    Vladimir Faster Glycol-Propyl Glycol (SYSTANE ULTRA) 0.4-0.3 % SOLN Place 1-2 drops into both eyes 4 (four) times daily as needed (for dry eyes).     No current facility-administered medications on file prior to visit.     Allergies  Allergen Reactions  .  Penicillins Rash and Other (See Comments)    PATIENT HAS HAD A PCN REACTION WITH IMMEDIATE RASH, FACIAL/TONGUE/THROAT SWELLING, SOB, OR LIGHTHEADEDNESS WITH HYPOTENSION:  #  #  #  YES  #  #  #   Has patient had a PCN reaction causing severe rash involving mucus membranes or skin necrosis: No Has patient had a PCN reaction that required hospitalization: No Has patient had a PCN reaction occurring within the last 10 years: No If all of the above answers are "NO", then may proceed with Cephalosporin use.   . Codeine Nausea Only  . Tramadol Hcl Other (See Comments)    "Made me crazy"---caused mood changes    Past Medical History:  Diagnosis Date  . Acute pancreatitis January 2014  . Chronic back pain   . Chronic fatigue   . Compression fracture of vertebral column ([place])   . Headache    PMH  . HTN (hypertension)   . Hypercholesteremia   . Macular hole   . Mild aortic stenosis   . MRSA (methicillin resistant Staphylococcus aureus)   . Osteoporosis   . Ovarian cyst   . RBBB   . Staph infection     Past Surgical History:  Procedure Laterality Date  . ABDOMINAL HYSTERECTOMY    . APPENDECTOMY    . athroscopy Right  shoulder    . Biopsy Left Breast x 2    . CESAREAN SECTION    . CHOLECYSTECTOMY    . PARS PLANA VITRECTOMY Left 10/09/2016   Procedure: PARS PLANA VITRECTOMY WITH 25 GAUGE;  Surgeon: Bernarda Caffey, MD;  Location: [facility];  Service: Ophthalmology;  Laterality: Left;  . PARS PLANA VITRECTOMY W/ REPAIR OF MACULAR HOLE Right 2015    Social History   Tobacco Use  Smoking Status Never Smoker  Smokeless Tobacco Never Used    Social History   Substance and Sexual Activity  Alcohol Use Yes   Comment: glass of wine infrequently    Family History  Problem Relation Age of Onset  . Cataracts Mother   . Heart disease Brother   . Heart disease Brother   . Cancer Unknown   . Heart disease Unknown   . Amblyopia Neg Hx   . Blindness Neg Hx   . Glaucoma Neg Hx   .  Macular degeneration Neg Hx   . Retinal detachment Neg Hx   . Strabismus Neg Hx   . Retinitis pigmentosa Neg Hx     Review of Systems: As noted in HPI.  All other systems were reviewed and are negative.  Physical Exam: BP (!) 150/66 (BP Location: Right Arm, Cuff Size: Normal)   Pulse 70   Ht 5' (1.524 m)   Wt 152 lb 6.4 oz (69.1 kg)   SpO2 97%   BMI 29.76 kg/m  GENERAL:  Well appearing, elderly WF in NAD HEENT:  PERRL, EOMI, sclera are clear. Oropharynx is clear. NECK:  No jugular venous distention, carotid upstroke brisk and symmetric, no bruits, no thyromegaly or adenopathy LUNGS:  Clear to auscultation bilaterally CHEST:  Unremarkable HEART:  RRR,  PMI not displaced or sustained,S1 and S2 within normal limits, no S3, no S4: no clicks, no rubs, harsh gr 2/6 systolic murmur RUSB>> LLSB ABD:  Soft, nontender. BS +, no masses or bruits. No hepatomegaly, no splenomegaly EXT:  2 + pulses throughout, 1+ edema, no cyanosis no clubbing SKIN:  Warm and dry.  No rashes NEURO:  Alert and oriented x 3. Cranial nerves II through XII intact. PSYCH:  Cognitively intact    LABORATORY DATA: Dated 2.27.18: cholesterol 216, triglycerides 358, HDL 34, LDL 110.  Dated 07/11/16: BUN 26 creatinine 1.1, other chemistries normal. Dated 04/09/17: cholesterol 182, triglycerides 488, HDL 32, LDL 64. Hgb 10.6.  Dated 10/23/17: creatinine 1.2.  ECG demonstrates normal sinus rhythm with an old right bundle branch block. Pulse is 70 beats per minute. First degree AV block. No change from 2018.I have personally reviewed and interpreted this study.   Assessment / Plan: 1. HTN- BP is under fair control. Will continue current therapy with amlodipine, metoprolol, losartan.   2. RBBB chronic.  Follow up in one year.

## 2018-03-30 ENCOUNTER — Encounter: Payer: Self-pay | Admitting: Cardiology

## 2018-03-30 ENCOUNTER — Ambulatory Visit (INDEPENDENT_AMBULATORY_CARE_PROVIDER_SITE_OTHER): Payer: Medicare Other | Admitting: Cardiology

## 2018-03-30 VITALS — BP 150/66 | HR 70 | Ht 60.0 in | Wt 152.4 lb

## 2018-03-30 DIAGNOSIS — I10 Essential (primary) hypertension: Secondary | ICD-10-CM | POA: Diagnosis not present

## 2018-03-30 DIAGNOSIS — E7800 Pure hypercholesterolemia, unspecified: Secondary | ICD-10-CM

## 2018-03-30 DIAGNOSIS — I4510 Unspecified right bundle-branch block: Secondary | ICD-10-CM

## 2018-04-07 DIAGNOSIS — Z961 Presence of intraocular lens: Secondary | ICD-10-CM | POA: Diagnosis not present

## 2018-04-07 DIAGNOSIS — H52203 Unspecified astigmatism, bilateral: Secondary | ICD-10-CM | POA: Diagnosis not present

## 2018-04-08 NOTE — Progress Notes (Signed)
[facility] Note  04/09/2018     CHIEF COMPLAINT Patient presents for Retina Follow Up   HISTORY OF PRESENT ILLNESS: Mallory Jensen is a 85 y.o. female who presents to the clinic today for:   HPI    Retina Follow Up    Patient presents with  Other (Macular hole OS).  In both eyes.  Severity is moderate.  Duration of 6 months.  Since onset it is gradually worsening.  I, the attending physician,  performed the HPI with the patient and updated documentation appropriately.       Last edited by Bernarda Caffey, MD on 04/09/2018 10:26 AM. (History)    pt states she is not seeing well, she states she just saw Dr. Ellie Lunch and was given a new rx for glasses, she states she can see distance okay, but she can read road signs, she states she does not drive though, she states she cannot see to write checks, daughter states she has tried several different powers of reading glasses, up to +4.00 and they do not seem to help  Referring physician: Haywood Pao, MD [place], [place] [id number]  HISTORICAL INFORMATION:   Selected notes from the [facility] Referral from Dr. Ellie Lunch for mac hole OS S/p PPV/ICG/MP/EL/FAX/12% C3F8 OS, 8.29.18 Past ocular history of macular hole OD s/p repair in 2015 w/ [facility]     CURRENT MEDICATIONS: Current Outpatient Medications (Ophthalmic Drugs)  Medication Sig  . Polyethyl Glycol-Propyl Glycol (SYSTANE ULTRA) 0.4-0.3 % SOLN Place 1-2 drops into both eyes 4 (four) times daily as needed (for dry eyes).   No current facility-administered medications for this visit.  (Ophthalmic Drugs)   Current Outpatient Medications (Other)  Medication Sig  . amLODipine (NORVASC) 5 MG tablet Take 1 tablet (5 mg total) by mouth daily.  Marland Kitchen aspirin EC 81 MG tablet Take 1 tablet (81 mg total) by mouth daily.  . calcium-vitamin D (OSCAL WITH D) 500-200 MG-UNIT per tablet Take 1 tablet by mouth daily.  . Cholecalciferol (VITAMIN  D3) 2000 units TABS Take 2,000 Units by mouth daily.  . furosemide (LASIX) 20 MG tablet TAKE 1-2 TABLETS ONCE DAILY AS NEEDED FOR FLUID RETENTION  . GINKGO BILOBA PO Take 1 tablet by mouth daily.  . Glucosamine-Chondroitin (COSAMIN DS PO) Take 1 tablet by mouth 2 (two) times daily.  . iron polysaccharides (FERREX 150) 150 MG capsule Take 150 mg by mouth daily.  Marland Kitchen losartan (COZAAR) 100 MG tablet Take 1 tablet (100 mg total) by mouth daily.  . metoprolol tartrate (LOPRESSOR) 25 MG tablet Take 12.5 mg by mouth 2 (two) times daily.  . Multiple Vitamin (MULTIVITAMIN WITH MINERALS) TABS Take 1 tablet by mouth daily. Centrum Silver  . naproxen sodium (ANAPROX) 220 MG tablet Take 220 mg by mouth 2 (two) times daily as needed (for pain.).  Marland Kitchen pantoprazole (PROTONIX) 40 MG tablet Take 1 tablet (40 mg total) by mouth daily.   No current facility-administered medications for this visit.  (Other)      REVIEW OF SYSTEMS: ROS    Positive for: Musculoskeletal, Eyes   Negative for: Constitutional, Gastrointestinal, Neurological, Skin, Genitourinary, HENT, Endocrine, Cardiovascular, Respiratory, Psychiatric, Allergic/Imm, Heme/Lymph   Last edited by Roselee Nova D on 04/09/2018  9:20 AM. (History)       ALLERGIES Allergies  Allergen Reactions  . Penicillins Rash and Other (See Comments)    PATIENT HAS HAD A PCN REACTION WITH IMMEDIATE RASH, FACIAL/TONGUE/THROAT SWELLING, SOB, OR  LIGHTHEADEDNESS WITH HYPOTENSION:  #  #  #  YES  #  #  #   Has patient had a PCN reaction causing severe rash involving mucus membranes or skin necrosis: No Has patient had a PCN reaction that required hospitalization: No Has patient had a PCN reaction occurring within the last 10 years: No If all of the above answers are "NO", then may proceed with Cephalosporin use.   . Codeine Nausea Only  . Tramadol Hcl Other (See Comments)    "Made me crazy"---caused mood changes    PAST MEDICAL HISTORY Past Medical History:   Diagnosis Date  . Acute pancreatitis January 2014  . Chronic back pain   . Chronic fatigue   . Compression fracture of vertebral column ([place])   . Headache    PMH  . HTN (hypertension)   . Hypercholesteremia   . Macular hole   . Mild aortic stenosis   . MRSA (methicillin resistant Staphylococcus aureus)   . Osteoporosis   . Ovarian cyst   . RBBB   . Staph infection    Past Surgical History:  Procedure Laterality Date  . ABDOMINAL HYSTERECTOMY    . APPENDECTOMY    . athroscopy Right shoulder    . Biopsy Left Breast x 2    . CESAREAN SECTION    . CHOLECYSTECTOMY    . PARS PLANA VITRECTOMY Left 10/09/2016   Procedure: PARS PLANA VITRECTOMY WITH 25 GAUGE;  Surgeon: Bernarda Caffey, MD;  Location: [facility];  Service: Ophthalmology;  Laterality: Left;  . PARS PLANA VITRECTOMY W/ REPAIR OF MACULAR HOLE Right 2015    FAMILY HISTORY Family History  Problem Relation Age of Onset  . Cataracts Mother   . Heart disease Brother   . Heart disease Brother   . Cancer Other   . Heart disease Other   . Amblyopia Neg Hx   . Blindness Neg Hx   . Glaucoma Neg Hx   . Macular degeneration Neg Hx   . Retinal detachment Neg Hx   . Strabismus Neg Hx   . Retinitis pigmentosa Neg Hx     SOCIAL HISTORY Social History   Tobacco Use  . Smoking status: Never Smoker  . Smokeless tobacco: Never Used  Substance Use Topics  . Alcohol use: Yes    Comment: glass of wine infrequently  . Drug use: No         OPHTHALMIC EXAM:  Base Eye Exam    Visual Acuity (Snellen - Linear)      Right Left   Dist [place] 20/30 -2 20/70   Dist ph cc NI 20/60 -3   Correction:  Glasses       Tonometry (Tonopen, 9:33 AM)      Right Left   Pressure 17 16       Pupils      Dark Light Shape React APD   Right 3 2 Round Brisk None   Left 3 2 Round Brisk None       Visual Fields (Counting fingers)      Left Right    Full Full       Extraocular Movement      Right Left    Full, Ortho Full, Ortho        Neuro/Psych    Oriented x3:  Yes   Mood/Affect:  Normal       Dilation    Both eyes:  1.0% Mydriacyl, 2.5% Phenylephrine @ 9:39 AM  Slit Lamp and Fundus Exam    External Exam      Right Left   External Normal Normal       Slit Lamp Exam      Right Left   Lids/Lashes Dermatochalasis - upper lid Dermatochalasis - upper lid   Conjunctiva/Sclera White and quiet White and quiet   Cornea Inferior 2+diffuse Punctate epithelial erosions 2+ diffuse Punctate epithelial erosions, Arcus   Anterior Chamber Deep and quiet Deep and quiet   Iris Round; dilated Round; dilated   Lens Posterior chamber intraocular lens, open Posterior capsular Posterior chamber intraocular lens, open PC   Vitreous Post Vitrectomy  post vitrectomy       Fundus Exam      Right Left   Disc Mild Pallor, mild Peripapillary atrophy temporally, Sharp rim Mild PPA inferiorly and temporally, mild Pallor   C/D Ratio 0.4 0.3   Macula Blunted foveal reflex, RPE changes; closed macular hole, No heme or edema Macular hole closed; RPE mottling and clumping centrally; small RPE clump superior to fovea, +drooplets   Vessels Vascular attenuation - mild Vascular attenuation - mild   Periphery Flat, attached, reticular degeneration Flat, attached, laser changes surrounding small tear 1200; reticular degeneration        Refraction    Manifest Refraction      Sphere Cylinder Axis Dist VA   Right -1.25 +1.25 005 20/40-2   Left -1.75 +1.25 172 20/60-3          IMAGING AND PROCEDURES  Imaging and Procedures for 04/09/17  OCT, Retina - OU - Both Eyes       Right Eye Quality was good. Central Foveal Thickness: 229. Progression has been stable. Findings include abnormal foveal contour, no IRF, no SRF (Closed macular hole; scattered ORA, irregular inner retinal contour temporally, trace cystic changes nasal macula).   Left Eye Quality was good. Central Foveal Thickness: 205. Progression has been stable. Findings  include no IRF, no SRF, abnormal foveal contour, outer retinal atrophy (Mac hole closed; central ORA, irregular inner retinal contour temporally).   Notes Images taken, stored on drive   Diagnosis / Impression:  OD: closed macular hole with focal ORA; no IRF/SRF OS: closed macular hole with central ORA; no IRF/SRF  Clinical management:  See below  Abbreviations: NFP - Normal foveal profile. CME - cystoid macular edema. PED - pigment epithelial detachment. IRF - intraretinal fluid. SRF - subretinal fluid. EZ - ellipsoid zone. ERM - epiretinal membrane. ORA - outer retinal atrophy. ORT - outer retinal tubulation. SRHM - subretinal hyper-reflective material                  ASSESSMENT/PLAN:    ICD-10-CM   1. Macular hole of left eye s/p PPV/MP/12%C3F8 OS, 8.29.18 H35.342   2. Macular hole, right H35.341   3. Pseudophakia of both eyes Z96.1   4. PCO (posterior capsular opacification), right H26.491   5. Retinal edema H35.81 OCT, Retina - OU - Both Eyes  6. PCO (posterior capsular opacification), bilateral H26.493     1. Full-thickness macular hole, left eye.   - s/p PPV/ICG/MP/EL/FAX/12% C3F8 OS, 8.29.18             - VA 20/60+2             - macular hole closed; retina in good position  - central ellipsoid mildly improved on OCT  - discussed OCT findings, prognosis and recovery             -  IOP 16 today  - F/U 9 months  2. History of macular hole, right eye - s/p mac hole repair in 2015 -- Dr. Starling Manns - hole closed, stable, VA 20/40 - monitor  3. Pseudophakia OU - PCIOL in good position OU - s/p YAG capsulotomy w/ Dr. Ellie Lunch     Ophthalmic Meds Ordered this visit:  No orders of the defined types were placed in this encounter.      Return in about 9 months (around 01/08/2019) for mac hole OS, DFE, OCT.  There are no Patient Instructions on file for this visit.   Explained the diagnoses, plan, and follow up with the patient and they expressed  understanding.  Patient expressed understanding of the importance of proper follow up care.   This document serves as a record of services personally performed by Gardiner Sleeper, MD, PhD. It was created on their behalf by Ernest Mallick, OA, an ophthalmic assistant. The creation of this record is the provider's dictation and/or activities during the visit.    Electronically signed by: Ernest Mallick, OA  02.26.2020 11:40 PM     Gardiner Sleeper, M.D., Ph.D. Diseases & Surgery of the Retina and Vitreous [facility]   I have reviewed the above documentation for accuracy and completeness, and I agree with the above. Gardiner Sleeper, M.D., Ph.D. 04/12/18 11:41 PM     Abbreviations: M myopia (nearsighted); A astigmatism; H hyperopia (farsighted); P presbyopia; Mrx spectacle prescription;  CTL contact lenses; OD right eye; OS left eye; OU both eyes  XT exotropia; ET esotropia; PEK punctate epithelial keratitis; PEE punctate epithelial erosions; DES dry eye syndrome; MGD meibomian gland dysfunction; ATs artificial tears; PFAT's preservative free artificial tears; Bent nuclear sclerotic cataract; PSC posterior subcapsular cataract; ERM epi-retinal membrane; PVD posterior vitreous detachment; RD retinal detachment; DM diabetes mellitus; DR diabetic retinopathy; NPDR non-proliferative diabetic retinopathy; PDR proliferative diabetic retinopathy; CSME clinically significant macular edema; DME diabetic macular edema; dbh dot blot hemorrhages; CWS cotton wool spot; POAG primary open angle glaucoma; C/D cup-to-disc ratio; HVF humphrey visual field; GVF goldmann visual field; OCT optical coherence tomography; IOP intraocular pressure; BRVO Branch retinal vein occlusion; CRVO central retinal vein occlusion; CRAO central retinal artery occlusion; BRAO branch retinal artery occlusion; RT retinal tear; SB scleral buckle; PPV pars plana vitrectomy; VH Vitreous hemorrhage; PRP panretinal laser  photocoagulation; IVK intravitreal kenalog; VMT vitreomacular traction; MH Macular hole;  NVD neovascularization of the disc; NVE neovascularization elsewhere; AREDS age related eye disease study; ARMD age related macular degeneration; POAG primary open angle glaucoma; EBMD epithelial/anterior basement membrane dystrophy; ACIOL anterior chamber intraocular lens; IOL intraocular lens; PCIOL posterior chamber intraocular lens; Phaco/IOL phacoemulsification with intraocular lens placement; Turon photorefractive keratectomy; LASIK laser assisted in situ keratomileusis; HTN hypertension; DM diabetes mellitus; COPD chronic obstructive pulmonary disease

## 2018-04-09 ENCOUNTER — Encounter (INDEPENDENT_AMBULATORY_CARE_PROVIDER_SITE_OTHER): Payer: Self-pay | Admitting: Ophthalmology

## 2018-04-09 ENCOUNTER — Ambulatory Visit (INDEPENDENT_AMBULATORY_CARE_PROVIDER_SITE_OTHER): Payer: Medicare Other | Admitting: Ophthalmology

## 2018-04-09 DIAGNOSIS — Z961 Presence of intraocular lens: Secondary | ICD-10-CM

## 2018-04-09 DIAGNOSIS — H26493 Other secondary cataract, bilateral: Secondary | ICD-10-CM | POA: Diagnosis not present

## 2018-04-09 DIAGNOSIS — H35342 Macular cyst, hole, or pseudohole, left eye: Secondary | ICD-10-CM | POA: Diagnosis not present

## 2018-04-09 DIAGNOSIS — H26491 Other secondary cataract, right eye: Secondary | ICD-10-CM

## 2018-04-09 DIAGNOSIS — H3581 Retinal edema: Secondary | ICD-10-CM

## 2018-04-09 DIAGNOSIS — H35341 Macular cyst, hole, or pseudohole, right eye: Secondary | ICD-10-CM

## 2018-04-20 DIAGNOSIS — M546 Pain in thoracic spine: Secondary | ICD-10-CM | POA: Diagnosis not present

## 2018-04-20 DIAGNOSIS — G458 Other transient cerebral ischemic attacks and related syndromes: Secondary | ICD-10-CM | POA: Diagnosis not present

## 2018-04-20 DIAGNOSIS — D638 Anemia in other chronic diseases classified elsewhere: Secondary | ICD-10-CM | POA: Diagnosis not present

## 2018-04-20 DIAGNOSIS — K219 Gastro-esophageal reflux disease without esophagitis: Secondary | ICD-10-CM | POA: Diagnosis not present

## 2018-04-20 DIAGNOSIS — H3530 Unspecified macular degeneration: Secondary | ICD-10-CM | POA: Diagnosis not present

## 2018-04-20 DIAGNOSIS — E559 Vitamin D deficiency, unspecified: Secondary | ICD-10-CM | POA: Diagnosis not present

## 2018-04-20 DIAGNOSIS — I10 Essential (primary) hypertension: Secondary | ICD-10-CM | POA: Diagnosis not present

## 2018-04-20 DIAGNOSIS — M810 Age-related osteoporosis without current pathological fracture: Secondary | ICD-10-CM | POA: Diagnosis not present

## 2018-04-20 DIAGNOSIS — Z6828 Body mass index (BMI) 28.0-28.9, adult: Secondary | ICD-10-CM | POA: Diagnosis not present

## 2018-04-20 DIAGNOSIS — E781 Pure hyperglyceridemia: Secondary | ICD-10-CM | POA: Diagnosis not present

## 2018-04-20 DIAGNOSIS — M1990 Unspecified osteoarthritis, unspecified site: Secondary | ICD-10-CM | POA: Diagnosis not present

## 2018-05-12 ENCOUNTER — Encounter (HOSPITAL_COMMUNITY): Payer: Medicare Other

## 2018-08-12 DIAGNOSIS — L821 Other seborrheic keratosis: Secondary | ICD-10-CM | POA: Diagnosis not present

## 2018-08-12 DIAGNOSIS — C4442 Squamous cell carcinoma of skin of scalp and neck: Secondary | ICD-10-CM | POA: Diagnosis not present

## 2018-08-12 DIAGNOSIS — R102 Pelvic and perineal pain: Secondary | ICD-10-CM | POA: Diagnosis not present

## 2018-08-12 DIAGNOSIS — Z85828 Personal history of other malignant neoplasm of skin: Secondary | ICD-10-CM | POA: Diagnosis not present

## 2018-08-12 DIAGNOSIS — D485 Neoplasm of uncertain behavior of skin: Secondary | ICD-10-CM | POA: Diagnosis not present

## 2018-08-12 DIAGNOSIS — D044 Carcinoma in situ of skin of scalp and neck: Secondary | ICD-10-CM | POA: Diagnosis not present

## 2018-10-08 NOTE — Progress Notes (Signed)
[facility] Note  10/09/2018     CHIEF COMPLAINT Patient presents for Retina Follow Up   HISTORY OF PRESENT ILLNESS: Mallory Jensen is a 85 y.o. female who presents to the clinic today for:   HPI    Retina Follow Up    Patient presents with  Other (Macular hole).  In left eye.  Severity is moderate.  Duration of 6 months.  Since onset it is gradually worsening.  I, the attending physician,  performed the HPI with the patient and updated documentation appropriately.          Comments    Patient states vision worse OS since visit on 02.27.20. Occasional floaters, not sure which eye, no flashes. Uses systane prn OU.        Last edited by Bernarda Caffey, MD on 10/09/2018  9:18 AM. (History)    Pt states that she had an appt with Dr. Ellie Lunch in February, but appt was cancelled due to [place], and pt has not rescheduled  Referring physician: Luberta Mutter, MD [place],  [place] [address]  HISTORICAL INFORMATION:   Selected notes from the MEDICAL RECORD NUMBER Referral from Dr. Ellie Lunch for mac hole OS S/p PPV/ICG/MP/EL/FAX/12% C3F8 OS, 8.29.18 Past ocular history of macular hole OD s/p repair in 2015 w/ [facility]     CURRENT MEDICATIONS: Current Outpatient Medications (Ophthalmic Drugs)  Medication Sig  . Polyethyl Glycol-Propyl Glycol (SYSTANE ULTRA) 0.4-0.3 % SOLN Place 1-2 drops into both eyes 4 (four) times daily as needed (for dry eyes).   No current facility-administered medications for this visit.  (Ophthalmic Drugs)   Current Outpatient Medications (Other)  Medication Sig  . amLODipine (NORVASC) 5 MG tablet Take 1 tablet (5 mg total) by mouth daily.  Marland Kitchen aspirin EC 81 MG tablet Take 1 tablet (81 mg total) by mouth daily.  . calcium-vitamin D (OSCAL WITH D) 500-200 MG-UNIT per tablet Take 1 tablet by mouth daily.  . Cholecalciferol (VITAMIN D3) 2000 units TABS Take 2,000 Units by mouth daily.  . furosemide (LASIX) 20 MG tablet  TAKE 1-2 TABLETS ONCE DAILY AS NEEDED FOR FLUID RETENTION  . GINKGO BILOBA PO Take 1 tablet by mouth daily.  . Glucosamine-Chondroitin (COSAMIN DS PO) Take 1 tablet by mouth 2 (two) times daily.  . iron polysaccharides (FERREX 150) 150 MG capsule Take 150 mg by mouth daily.  Marland Kitchen losartan (COZAAR) 100 MG tablet Take 1 tablet (100 mg total) by mouth daily.  . metoprolol tartrate (LOPRESSOR) 25 MG tablet Take 12.5 mg by mouth 2 (two) times daily.  . Multiple Vitamin (MULTIVITAMIN WITH MINERALS) TABS Take 1 tablet by mouth daily. Centrum Silver  . pantoprazole (PROTONIX) 40 MG tablet Take 1 tablet (40 mg total) by mouth daily.  . naproxen sodium (ANAPROX) 220 MG tablet Take 220 mg by mouth 2 (two) times daily as needed (for pain.).   No current facility-administered medications for this visit.  (Other)      REVIEW OF SYSTEMS: ROS    Positive for: Musculoskeletal, Eyes   Negative for: Constitutional, Gastrointestinal, Neurological, Skin, Genitourinary, HENT, Endocrine, Cardiovascular, Respiratory, Psychiatric, Allergic/Imm, Heme/Lymph   Last edited by Roselee Nova D, COT on 10/09/2018  9:01 AM. (History)       ALLERGIES Allergies  Allergen Reactions  . Penicillins Rash and Other (See Comments)    PATIENT HAS HAD A PCN REACTION WITH IMMEDIATE RASH, FACIAL/TONGUE/THROAT SWELLING, SOB, OR LIGHTHEADEDNESS WITH HYPOTENSION:  #  #  #  YES  #  #  #   Has patient had a PCN reaction causing severe rash involving mucus membranes or skin necrosis: No Has patient had a PCN reaction that required hospitalization: No Has patient had a PCN reaction occurring within the last 10 years: No If all of the above answers are "NO", then may proceed with Cephalosporin use.   . Codeine Nausea Only  . Tramadol Hcl Other (See Comments)    "Made me crazy"---caused mood changes    PAST MEDICAL HISTORY Past Medical History:  Diagnosis Date  . Acute pancreatitis January 2014  . Chronic back pain   . Chronic  fatigue   . Compression fracture of vertebral column ([place])   . Headache    PMH  . HTN (hypertension)   . Hypercholesteremia   . Macular hole   . Mild aortic stenosis   . MRSA (methicillin resistant Staphylococcus aureus)   . Osteoporosis   . Ovarian cyst   . RBBB   . Staph infection    Past Surgical History:  Procedure Laterality Date  . ABDOMINAL HYSTERECTOMY    . APPENDECTOMY    . athroscopy Right shoulder    . Biopsy Left Breast x 2    . CESAREAN SECTION    . CHOLECYSTECTOMY    . PARS PLANA VITRECTOMY Left 10/09/2016   Procedure: PARS PLANA VITRECTOMY WITH 25 GAUGE;  Surgeon: Bernarda Caffey, MD;  Location: [facility];  Service: Ophthalmology;  Laterality: Left;  . PARS PLANA VITRECTOMY W/ REPAIR OF MACULAR HOLE Right 2015    FAMILY HISTORY Family History  Problem Relation Age of Onset  . Cataracts Mother   . Heart disease Brother   . Heart disease Brother   . Cancer Other   . Heart disease Other   . Amblyopia Neg Hx   . Blindness Neg Hx   . Glaucoma Neg Hx   . Macular degeneration Neg Hx   . Retinal detachment Neg Hx   . Strabismus Neg Hx   . Retinitis pigmentosa Neg Hx     SOCIAL HISTORY Social History   Tobacco Use  . Smoking status: Never Smoker  . Smokeless tobacco: Never Used  Substance Use Topics  . Alcohol use: Yes    Comment: glass of wine infrequently  . Drug use: No         OPHTHALMIC EXAM:  Base Eye Exam    Visual Acuity (Snellen - Linear)      Right Left   Dist [place] 20/30 -2 20/80   Dist ph [place] NI 20/60 -3       Tonometry (Tonopen, 9:15 AM)      Right Left   Pressure 19 17       Pupils      Dark Light Shape React APD   Right 4 3 Round Brisk None   Left 3 2 Round Slow None       Visual Fields (Counting fingers)      Left Right    Full Full       Extraocular Movement      Right Left    Full, Ortho Full, Ortho       Neuro/Psych    Oriented x3: Yes   Mood/Affect: Normal       Dilation    Both eyes: 1.0% Mydriacyl, 2.5%  Phenylephrine @ 9:15 AM        Slit Lamp and Fundus Exam    External Exam      Right Left  External Normal Normal       Slit Lamp Exam      Right Left   Lids/Lashes Dermatochalasis - upper lid Dermatochalasis - upper lid   Conjunctiva/Sclera White and quiet White and quiet   Cornea Inferior 2+diffuse Punctate epithelial erosions 2+ diffuse Punctate epithelial erosions, Arcus   Anterior Chamber Deep and quiet Deep and quiet   Iris Round; dilated Round; dilated   Lens Posterior chamber intraocular lens, open Posterior capsular Posterior chamber intraocular lens, open PC   Vitreous Post Vitrectomy  post vitrectomy       Fundus Exam      Right Left   Disc Mild Pallor, mild Peripapillary atrophy temporally, Sharp rim Mild PPA inferiorly and temporally, mild Pallor   C/D Ratio 0.4 0.3   Macula Blunted foveal reflex, RPE changes; closed macular hole, No heme or edema Macular hole closed; RPE mottling and clumping centrally; small RPE clump superior to fovea, +druplets   Vessels Vascular attenuation - mild Vascular attenuation - mild   Periphery Flat, attached, reticular degeneration Flat, attached, laser changes surrounding small tear 1200; reticular degeneration        Refraction    Manifest Refraction      Sphere Cylinder Axis Dist VA   Right -0.25 +1.25 005 20/30-1   Left -1.25 +1.25 172 20/70-3          IMAGING AND PROCEDURES  Imaging and Procedures for 04/09/17  OCT, Retina - OU - Both Eyes       Right Eye Quality was good. Central Foveal Thickness: 216. Progression has been stable. Findings include abnormal foveal contour, no IRF, no SRF (Closed macular hole; scattered ORA, irregular inner retinal contour temporally).   Left Eye Quality was good. Central Foveal Thickness: 205. Progression has been stable. Findings include no IRF, no SRF, abnormal foveal contour, outer retinal atrophy (Mac hole closed; central ORA, irregular inner retinal contour temporally; mild  interval improvement in central ellipsoid signal).   Notes Images taken, stored on drive   Diagnosis / Impression:  OD: closed macular hole with focal ORA; no IRF/SRF OS: closed macular hole with central ORA -- mild interval improvement in central ellipsoid signal; no IRF/SRF  Clinical management:  See below  Abbreviations: NFP - Normal foveal profile. CME - cystoid macular edema. PED - pigment epithelial detachment. IRF - intraretinal fluid. SRF - subretinal fluid. EZ - ellipsoid zone. ERM - epiretinal membrane. ORA - outer retinal atrophy. ORT - outer retinal tubulation. SRHM - subretinal hyper-reflective material                  ASSESSMENT/PLAN:    ICD-10-CM   1. Macular hole of left eye s/p PPV/MP/12%C3F8 OS, 8.29.18  H35.342   2. Macular hole, right  H35.341   3. Pseudophakia of both eyes  Z96.1   4. Retinal edema  H35.81 OCT, Retina - OU - Both Eyes    1. Full-thickness macular hole, left eye.    - s/p PPV/ICG/MP/EL/FAX/12% C3F8 OS, 8.29.18             - BCVA 20/60+2 -- stable             - macular hole closed; retina in good position  - central ellipsoid signal mildly improved on OCT  - discussed OCT findings, prognosis and recovery             - IOP 17 today  - F/U 1 year -- sooner prn   2. History of macular  hole, right eye  - s/p mac hole repair in 2015 -- Dr. Starling Manns  - hole closed, stable, VA 20/40  - monitor  3. Pseudophakia OU  - PCIOL in good position OU  - s/p YAG capsulotomy w/ Dr. Ellie Lunch     Ophthalmic Meds Ordered this visit:  No orders of the defined types were placed in this encounter.      Return in about 1 year (around 10/09/2019) for Dilated Exam, OCT.  There are no Patient Instructions on file for this visit.   Explained the diagnoses, plan, and follow up with the patient and they expressed understanding.  Patient expressed understanding of the importance of proper follow up care.   This document serves as a record of  services personally performed by Gardiner Sleeper, MD, PhD. It was created on their behalf by Ernest Mallick, OA, an ophthalmic assistant. The creation of this record is the provider's dictation and/or activities during the visit.    Electronically signed by: Ernest Mallick, OA  08.27.2020 11:42 AM    Gardiner Sleeper, M.D., Ph.D. Diseases & Surgery of the Retina and Vitreous [facility]   I have reviewed the above documentation for accuracy and completeness, and I agree with the above. Gardiner Sleeper, M.D., Ph.D. 10/09/18 11:42 AM    Abbreviations: M myopia (nearsighted); A astigmatism; H hyperopia (farsighted); P presbyopia; Mrx spectacle prescription;  CTL contact lenses; OD right eye; OS left eye; OU both eyes  XT exotropia; ET esotropia; PEK punctate epithelial keratitis; PEE punctate epithelial erosions; DES dry eye syndrome; MGD meibomian gland dysfunction; ATs artificial tears; PFAT's preservative free artificial tears; [place] nuclear sclerotic cataract; PSC posterior subcapsular cataract; ERM epi-retinal membrane; PVD posterior vitreous detachment; RD retinal detachment; DM diabetes mellitus; DR diabetic retinopathy; NPDR non-proliferative diabetic retinopathy; PDR proliferative diabetic retinopathy; CSME clinically significant macular edema; DME diabetic macular edema; dbh dot blot hemorrhages; CWS cotton wool spot; POAG primary open angle glaucoma; C/D cup-to-disc ratio; HVF humphrey visual field; GVF goldmann visual field; OCT optical coherence tomography; IOP intraocular pressure; BRVO Branch retinal vein occlusion; CRVO central retinal vein occlusion; CRAO central retinal artery occlusion; BRAO branch retinal artery occlusion; RT retinal tear; SB scleral buckle; PPV pars plana vitrectomy; VH Vitreous hemorrhage; PRP panretinal laser photocoagulation; IVK intravitreal kenalog; VMT vitreomacular traction; MH Macular hole;  NVD neovascularization of the disc; NVE  neovascularization elsewhere; AREDS age related eye disease study; ARMD age related macular degeneration; POAG primary open angle glaucoma; EBMD epithelial/anterior basement membrane dystrophy; ACIOL anterior chamber intraocular lens; IOL intraocular lens; PCIOL posterior chamber intraocular lens; Phaco/IOL phacoemulsification with intraocular lens placement; [place] photorefractive keratectomy; LASIK laser assisted in situ keratomileusis; HTN hypertension; DM diabetes mellitus; COPD chronic obstructive pulmonary disease

## 2018-10-09 ENCOUNTER — Ambulatory Visit (INDEPENDENT_AMBULATORY_CARE_PROVIDER_SITE_OTHER): Payer: Medicare Other | Admitting: Ophthalmology

## 2018-10-09 ENCOUNTER — Encounter (INDEPENDENT_AMBULATORY_CARE_PROVIDER_SITE_OTHER): Payer: Self-pay | Admitting: Ophthalmology

## 2018-10-09 ENCOUNTER — Other Ambulatory Visit: Payer: Self-pay

## 2018-10-09 DIAGNOSIS — Z961 Presence of intraocular lens: Secondary | ICD-10-CM

## 2018-10-09 DIAGNOSIS — H35341 Macular cyst, hole, or pseudohole, right eye: Secondary | ICD-10-CM

## 2018-10-09 DIAGNOSIS — H3581 Retinal edema: Secondary | ICD-10-CM | POA: Diagnosis not present

## 2018-10-09 DIAGNOSIS — H35342 Macular cyst, hole, or pseudohole, left eye: Secondary | ICD-10-CM | POA: Diagnosis not present

## 2018-10-21 DIAGNOSIS — M810 Age-related osteoporosis without current pathological fracture: Secondary | ICD-10-CM | POA: Diagnosis not present

## 2018-10-21 DIAGNOSIS — D638 Anemia in other chronic diseases classified elsewhere: Secondary | ICD-10-CM | POA: Diagnosis not present

## 2018-10-21 DIAGNOSIS — I10 Essential (primary) hypertension: Secondary | ICD-10-CM | POA: Diagnosis not present

## 2018-10-21 DIAGNOSIS — K219 Gastro-esophageal reflux disease without esophagitis: Secondary | ICD-10-CM | POA: Diagnosis not present

## 2018-10-21 DIAGNOSIS — E559 Vitamin D deficiency, unspecified: Secondary | ICD-10-CM | POA: Diagnosis not present

## 2018-10-21 DIAGNOSIS — F321 Major depressive disorder, single episode, moderate: Secondary | ICD-10-CM | POA: Diagnosis not present

## 2018-10-21 DIAGNOSIS — M1990 Unspecified osteoarthritis, unspecified site: Secondary | ICD-10-CM | POA: Diagnosis not present

## 2018-10-21 DIAGNOSIS — M546 Pain in thoracic spine: Secondary | ICD-10-CM | POA: Diagnosis not present

## 2018-10-21 DIAGNOSIS — E781 Pure hyperglyceridemia: Secondary | ICD-10-CM | POA: Diagnosis not present

## 2018-10-21 DIAGNOSIS — H3530 Unspecified macular degeneration: Secondary | ICD-10-CM | POA: Diagnosis not present

## 2018-10-22 DIAGNOSIS — Z23 Encounter for immunization: Secondary | ICD-10-CM | POA: Diagnosis not present

## 2018-11-18 DIAGNOSIS — L821 Other seborrheic keratosis: Secondary | ICD-10-CM | POA: Diagnosis not present

## 2018-11-18 DIAGNOSIS — Z85828 Personal history of other malignant neoplasm of skin: Secondary | ICD-10-CM | POA: Diagnosis not present

## 2018-11-18 DIAGNOSIS — C4441 Basal cell carcinoma of skin of scalp and neck: Secondary | ICD-10-CM | POA: Diagnosis not present

## 2018-11-18 DIAGNOSIS — C44329 Squamous cell carcinoma of skin of other parts of face: Secondary | ICD-10-CM | POA: Diagnosis not present

## 2018-11-18 DIAGNOSIS — L570 Actinic keratosis: Secondary | ICD-10-CM | POA: Diagnosis not present

## 2018-11-19 ENCOUNTER — Encounter (HOSPITAL_COMMUNITY): Payer: Medicare Other

## 2018-11-20 ENCOUNTER — Other Ambulatory Visit (HOSPITAL_COMMUNITY): Payer: Self-pay | Admitting: *Deleted

## 2018-11-23 ENCOUNTER — Ambulatory Visit (HOSPITAL_COMMUNITY)
Admission: RE | Admit: 2018-11-23 | Discharge: 2018-11-23 | Disposition: A | Discharge status: Home / Self Care | Payer: Medicare Other | Source: Ambulatory Visit | Attending: Internal Medicine | Admitting: Internal Medicine

## 2018-11-23 ENCOUNTER — Other Ambulatory Visit: Payer: Self-pay

## 2018-11-23 DIAGNOSIS — M810 Age-related osteoporosis without current pathological fracture: Secondary | ICD-10-CM | POA: Insufficient documentation

## 2018-11-23 MED ORDER — ZOLEDRONIC ACID 5 MG/100ML IV SOLN
5.0000 mg | Freq: Once | INTRAVENOUS | Status: AC
  Administered 2018-11-23: 10:00:00 5 mg via INTRAVENOUS

## 2018-11-23 MED ORDER — ZOLEDRONIC ACID 5 MG/100ML IV SOLN
INTRAVENOUS | Status: AC
  Administered 2018-11-23: 5 mg via INTRAVENOUS
  Filled 2018-11-23: qty 100

## 2019-01-13 ENCOUNTER — Telehealth: Payer: Self-pay | Admitting: Cardiology

## 2019-01-13 DIAGNOSIS — Z961 Presence of intraocular lens: Secondary | ICD-10-CM | POA: Diagnosis not present

## 2019-01-13 DIAGNOSIS — H52203 Unspecified astigmatism, bilateral: Secondary | ICD-10-CM | POA: Diagnosis not present

## 2019-01-13 NOTE — Telephone Encounter (Signed)
Tried patient x2. Busy signal both times

## 2019-01-13 NOTE — Telephone Encounter (Signed)
Pt c/o Shortness Of Breath: STAT if SOB developed within the last 24 hours or pt is noticeably SOB on the phone  1. Are you currently SOB (can you hear that pt is SOB on the phone)? Yes a little  2. How long have you been experiencing SOB? About a week today has been worse   3. Are you SOB when sitting or when up moving around? Moving around  4. Are you currently experiencing any other symptoms? No

## 2019-01-14 NOTE — Telephone Encounter (Signed)
Called and spoke with the pt an she reports that she has been having increasing shortness of breath for the past several days. It started really worsening yesterday and today she is SOB at rest and feels "jittery" ... she has not eaten today yet. Her BP is 147/52.   Pt denies chest pain, cough, dizziness.... has had on and off bilateral ankle and foot edema that improves when she takes her prn Lasix 20mg ... which she took it yesterday. It did not improve her breathing.   Pt was audibly SOB on the phone. She can lay flat to sleep. She can still do her ADL's but was more difficult yesterday and today.   Pts daughter Jenny Reichmann is asking for her to be seen today. I can get her in with Kerin Ransom PA 01/15/19 but will check in with the office to see if any other availability. Possibly a DOD appt.   Pt declined feeling bad enough to go to the ED.

## 2019-01-14 NOTE — Progress Notes (Signed)
Cardiology Clinic Note   Patient Name: Mallory Jensen Date of Encounter: 01/15/2019  Primary Care Provider:  Haywood Pao, MD Primary Cardiologist:  Mallory Martinique, MD  Patient Profile    Mallory Jensen 85 year old female presents today for increased shortness of breath and hypertension.  Past Medical History    Past Medical History:  Diagnosis Date  . Acute pancreatitis January 2014  . Chronic back pain   . Chronic fatigue   . Compression fracture of vertebral column ([place])   . Headache    PMH  . HTN (hypertension)   . Hypercholesteremia   . Macular hole   . Mild aortic stenosis   . MRSA (methicillin resistant Staphylococcus aureus)   . Osteoporosis   . Ovarian cyst   . RBBB   . Staph infection    Past Surgical History:  Procedure Laterality Date  . ABDOMINAL HYSTERECTOMY    . APPENDECTOMY    . athroscopy Right shoulder    . Biopsy Left Breast x 2    . CESAREAN SECTION    . CHOLECYSTECTOMY    . PARS PLANA VITRECTOMY Left 10/09/2016   Procedure: PARS PLANA VITRECTOMY WITH 25 GAUGE;  Surgeon: Mallory Caffey, MD;  Location: [facility];  Service: Ophthalmology;  Laterality: Left;  . PARS PLANA VITRECTOMY W/ REPAIR OF MACULAR HOLE Right 2015    Allergies  Allergies  Allergen Reactions  . Penicillins Rash and Other (See Comments)    PATIENT HAS HAD A PCN REACTION WITH IMMEDIATE RASH, FACIAL/TONGUE/THROAT SWELLING, SOB, OR LIGHTHEADEDNESS WITH HYPOTENSION:  #  #  #  YES  #  #  #   Has patient had a PCN reaction causing severe rash involving mucus membranes or skin necrosis: No Has patient had a PCN reaction that required hospitalization: No Has patient had a PCN reaction occurring within the last 10 years: No If all of the above answers are "NO", then may proceed with Cephalosporin use.   . Codeine Nausea Only  . Tramadol Hcl Other (See Comments)    "Made me crazy"---caused mood changes    History of Present Illness    Ms. Can is a past medical  history of hypertension, RBBB, mild aortic stenosis, pancreatitis, thoracic compression fracture, hypercholesterolemia, and altered mental status.  She was last seen by Dr. Martinique on 03/30/2018.  During that time she reported that she had had double pneumonia in January and recovered well.  She had no complaints of dyspnea, cough, fever, or chest pain.  Her biggest complaint at that time was left carpal tunnel like symptoms.  She was also noted to have some chronic edema at that time.  She presents to the clinic today, with her daughter, after also seeing her PCP this morning for increased work of breathing.  She states after starting her amlodipine 2-1/2 weeks ago she has noticed increased leg swelling and increased shortness of breath with activity.  She states she follows a low-sodium diet does not drink excess fluids and was very physically active until COVID-19.  She states she continues to cook, she is to be a Technical brewer, and enjoys baking breads and cookies.  She presented to her PCP today who drew a BMP.  We will request those labs.  I will decrease her amlodipine to 5 mg daily, have her take Lasix 40 mg x 3 days, potassium 20 mEq x 3 days, and give her the salty 6 sheet.  She denies chest pain,  palpitations, melena, hematuria, hemoptysis, diaphoresis,  weakness, presyncope, syncope, orthopnea, and PND.  Home Medications    Prior to Admission medications   Medication Sig Start Date End Date Taking? Authorizing Provider  amLODipine (NORVASC) 5 MG tablet Take 1 tablet (5 mg total) by mouth daily. 09/03/12   Tisovec, Fransico Him, MD  aspirin EC 81 MG tablet Take 1 tablet (81 mg total) by mouth daily. 08/28/16   Jensen, Mallory M, MD  calcium-vitamin D (OSCAL WITH D) 500-200 MG-UNIT per tablet Take 1 tablet by mouth daily.    [provider]  Cholecalciferol (VITAMIN D3) 2000 units TABS Take 2,000 Units by mouth daily.    [provider]  furosemide (LASIX) 20 MG tablet TAKE 1-2 TABLETS  ONCE DAILY AS NEEDED FOR FLUID RETENTION 03/11/18   [provider]  GINKGO BILOBA PO Take 1 tablet by mouth daily.    [provider]  Glucosamine-Chondroitin (COSAMIN DS PO) Take 1 tablet by mouth 2 (two) times daily.    [provider]  iron polysaccharides (FERREX 150) 150 MG capsule Take 150 mg by mouth daily.    [provider]  losartan (COZAAR) 100 MG tablet Take 1 tablet (100 mg total) by mouth daily. 09/03/12   Tisovec, Fransico Him, MD  metoprolol tartrate (LOPRESSOR) 25 MG tablet Take 12.5 mg by mouth 2 (two) times daily. 01/01/13   [provider]  Multiple Vitamin (MULTIVITAMIN WITH MINERALS) TABS Take 1 tablet by mouth daily. Centrum Silver    [provider]  naproxen sodium (ANAPROX) 220 MG tablet Take 220 mg by mouth 2 (two) times daily as needed (for pain.).    [provider]  pantoprazole (PROTONIX) 40 MG tablet Take 1 tablet (40 mg total) by mouth daily. 03/18/12   Tisovec, Fransico Him, MD  Polyethyl Glycol-Propyl Glycol (SYSTANE ULTRA) 0.4-0.3 % SOLN Place 1-2 drops into both eyes 4 (four) times daily as needed (for dry eyes).    [provider]    Family History    Family History  Problem Relation Age of Onset  . Cataracts Mother   . Heart disease Brother   . Heart disease Brother   . Cancer Other   . Heart disease Other   . Amblyopia Neg Hx   . Blindness Neg Hx   . Glaucoma Neg Hx   . Macular degeneration Neg Hx   . Retinal detachment Neg Hx   . Strabismus Neg Hx   . Retinitis pigmentosa Neg Hx    She indicated that her mother is deceased. She indicated that her father is deceased. She indicated that her sister is alive. She indicated that all of her three brothers are deceased. She indicated that the status of her neg hx is unknown.  Social History    Social History   Socioeconomic History  . Marital status: Widowed    Spouse name: Not on file  . Number of children: 5  . Years of education:  Not on file  . Highest education level: Not on file  Occupational History  . Occupation: catering  Social Needs  . Financial resource strain: Not on file  . Food insecurity    Worry: Not on file    Inability: Not on file  . Transportation needs    Medical: Not on file    Non-medical: Not on file  Tobacco Use  . Smoking status: Never Smoker  . Smokeless tobacco: Never Used  Substance and Sexual Activity  . Alcohol use: Yes    Comment: glass of  wine infrequently  . Drug use: No  . Sexual activity: Never  Lifestyle  . Physical activity    Days per week: Not on file    Minutes per session: Not on file  . Stress: Not on file  Relationships  . Social Herbalist on phone: Not on file    Gets together: Not on file    Attends religious service: Not on file    Active member of club or organization: Not on file    Attends meetings of clubs or organizations: Not on file    Relationship status: Not on file  . Intimate partner violence    Fear of current or ex partner: Not on file    Emotionally abused: Not on file    Physically abused: Not on file    Forced sexual activity: Not on file  Other Topics Concern  . Not on file  Social History Narrative  . Not on file     Review of Systems    General:  No chills, fever, night sweats or weight changes.  Cardiovascular:  No chest pain, dyspnea on exertion, edema, orthopnea, palpitations, paroxysmal nocturnal dyspnea. Dermatological: No rash, lesions/masses Respiratory: No cough, dyspnea Urologic: No hematuria, dysuria Abdominal:   No nausea, vomiting, diarrhea, bright red blood per rectum, melena, or hematemesis Neurologic:  No visual changes, wkns, changes in mental status. All other systems reviewed and are otherwise negative except as noted above.  Physical Exam    VS:  BP (!) 152/66 (BP Location: Left Arm, Patient Position: Sitting, Cuff Size: Normal)   Pulse 80   Ht 5' (1.524 m)   Wt 151 lb 12.8 oz (68.9 kg)    SpO2 98%   BMI 29.65 kg/m  , BMI Body mass index is 29.65 kg/m. GEN: Well nourished, well developed, in no acute distress. HEENT: normal. Neck: Supple, no JVD, carotid bruits, or masses. Cardiac: RRR, no murmurs, rubs, or gallops. No clubbing, cyanosis, bilateral lower extremity +2 pitting edema up to knee.  Radials/DP/PT 2+ and equal bilaterally.  Respiratory:  Respirations regular and unlabored, clear to auscultation bilaterally. GI: Soft, nontender, nondistended, BS + x 4. MS: no deformity or atrophy. Skin: warm and dry, no rash. Neuro:  Strength and sensation are intact. Psych: Normal affect.  Accessory Clinical Findings    ECG personally reviewed by me today-normal sinus rhythm right bundle branch block left anterior fascicular block possible lateral infarct undetermined age 58 bpm- No acute changes  EKG 08/28/2018 Sinus rhythm first-degree AV block right bundle branch block inferior infarct undetermined age 27 bpm  Echocardiogram 08/30/2012 Study Conclusions   - Left ventricle: The cavity size was normal. Wall thickness  was increased in a pattern of moderate LVH. Systolic  function was normal. The estimated ejection fraction was  in the range of 60% to 65%. Doppler parameters are  consistent with abnormal left ventricular relaxation  (grade 1 diastolic dysfunction).  - Mitral valve: Mildly calcified annulus.  - Left atrium: The atrium was mildly dilated.  Impressions:    Assessment & Plan   1.  Dyspnea on exertion- has had increased work of breathing over the last 2 weeks. Had taken Lasix at home but has not had great result.  She did not take any Lasix today. Take lasix 40 mg x 3 days  Take Potassium 20 MEQ x 3 days  BMP in one week   Lower Extremity edema- Bilateral lower extremity +2 pitting to her knee. Lower extremity support  stockings  Elevate extremities when not active through the day Low sodium Heart healthy diet-salty 6 Increase physical  activity as tolerated Lasix 40 mg x 3 days Potassium 20 mEq x 3 days Daily wts  Essential Hypertension-BP today 152/66.  Will adjust blood pressure medications after diuresis. Reduce amlodipine to 5 mg daily Continue losartan 100 mg daily Heart healthy low-sodium diet Increase physical activity as tolerated  Chronic Right Bundle Branch Block- EKG today showsnormal sinus rhythm right bundle branch block left anterior fascicular block possible lateral infarct undetermined age 60 bpm- Continue to monitor  Disposition: Follow up with App in one week.   Jossie Ng. [facility] [place] [address] Office [phone] Fax [phone]

## 2019-01-14 NOTE — Telephone Encounter (Signed)
Follow up   Attempted to call triage per the previous message. Could not reach triage. Please call the patient asap patient is still having sob.

## 2019-01-14 NOTE — Telephone Encounter (Signed)
Follow up  To [facility] Triage

## 2019-01-14 NOTE — Telephone Encounter (Signed)
Spoke with the pts daughter, Jenny Reichmann again, she is going to have the pt take one of her PRN Lasix 20mg  now.. she is resting comfortable but is SOB with any minimal exertion... she has an appt with Dr. Osborne Casco PA tomorrow morning... I made her an appt for after that appt at 11:30 am.    Pt was instructed to watch her NA intake... her daughter days she does not eat much and will watch her NA.   Will call her back if the DOD has any changes to this plan so far.

## 2019-01-15 ENCOUNTER — Other Ambulatory Visit: Payer: Self-pay

## 2019-01-15 ENCOUNTER — Encounter: Payer: Self-pay | Admitting: General Practice

## 2019-01-15 ENCOUNTER — Ambulatory Visit: Payer: Medicare Other | Admitting: General Practice

## 2019-01-15 ENCOUNTER — Ambulatory Visit (INDEPENDENT_AMBULATORY_CARE_PROVIDER_SITE_OTHER): Payer: Medicare Other | Admitting: General Practice

## 2019-01-15 VITALS — BP 152/66 | HR 80 | Ht 60.0 in | Wt 151.8 lb

## 2019-01-15 DIAGNOSIS — R0609 Other forms of dyspnea: Secondary | ICD-10-CM

## 2019-01-15 DIAGNOSIS — R600 Localized edema: Secondary | ICD-10-CM

## 2019-01-15 DIAGNOSIS — I4510 Unspecified right bundle-branch block: Secondary | ICD-10-CM | POA: Diagnosis not present

## 2019-01-15 DIAGNOSIS — I10 Essential (primary) hypertension: Secondary | ICD-10-CM

## 2019-01-15 DIAGNOSIS — R0600 Dyspnea, unspecified: Secondary | ICD-10-CM | POA: Diagnosis not present

## 2019-01-15 DIAGNOSIS — Z79899 Other long term (current) drug therapy: Secondary | ICD-10-CM

## 2019-01-15 MED ORDER — POTASSIUM CHLORIDE CRYS ER 20 MEQ PO TBCR: 20.0000 meq | EXTENDED_RELEASE_TABLET | Freq: Every day | ORAL | 3 refills | Status: DC

## 2019-01-15 MED ORDER — AMLODIPINE BESYLATE 5 MG PO TABS: 10.0000 mg | ORAL_TABLET | Freq: Every day | ORAL | Status: DC

## 2019-01-15 MED ORDER — AMLODIPINE BESYLATE 5 MG PO TABS: 5.0000 mg | ORAL_TABLET | Freq: Every day | ORAL | Status: DC

## 2019-01-15 NOTE — Patient Instructions (Addendum)
Medication Instructions:  AMLODIPINE 5MG  DAILY  LASIX 40MG  DAILY x3D THEN BACK TO 20MG  DAILY  POTASSIUM 20MEQ DAILY x3D THEN STOP If you need a refill on your cardiac medications before your next appointment, please call your pharmacy.  Labwork: BMET IN 1 WEEK-01-22-2019 HERE IN OUR OFFICE AT [facility]    You will NOT need to fast  If you have labs (blood work) drawn today and your tests are completely normal, you will receive your results only by: Marland Kitchen MyChart Message (if you have MyChart) OR . A paper copy in the mail If you have any lab test that is abnormal or we need to change your treatment, we will call you to review the results.  Special Instructions: Daily Weight Record: It is important to weigh yourself daily. Keep yoru daily weight chart near your scale. Weigh yourself each morning at the same time. Weigh yourself without shoes, and try to wear the same amount of clothing each day. Compare today's weight to yesterday's weight.  Bring this form with you to your follow-up appointments.  ELEVATE LEGS WHEN SITTING  PLEASE READ AND FOLLOW SALTY 6 ATTACHED  Follow-Up: IN 1 WEEK WITH JESSE CLEAVER, FNP -AFTERNOON APPT   In Person You may see Peter Martinique, MD or one of the following Advanced Practice Providers on your designated Care Team:  Almyra Deforest, PA-C  Fabian Sharp, PA-C or [employer], [place].    At [facility], you and your health needs are our priority.  As part of our continuing mission to provide you with exceptional heart care, we have created designated Provider Care Teams.  These Care Teams include your primary Cardiologist (physician) and Advanced Practice Providers (APPs -  Physician Assistants and Nurse Practitioners) who all work together to provide you with the care you need, when you need it.  Thank you for choosing [facility] at [facility]!!             Happy Holidays!!

## 2019-01-18 NOTE — Telephone Encounter (Signed)
Called patient's daughter Jenny Reichmann.Left message on personal voice mail to call me back tomorrow about scheduling appointment with Dr.Jordan 12/10.

## 2019-01-18 NOTE — Progress Notes (Deleted)
{Choose 1 Note Type (Telehealth Visit or Telephone Visit):[phone]}  Evaluation Performed:  Follow-up visit  This visit type was conducted due to national recommendations for restrictions regarding the COVID-19 Pandemic (e.g. social distancing).  This format is felt to be most appropriate for this patient at this time.  All issues noted in this document were discussed and addressed.  No physical exam was performed (except for noted visual exam findings with Video Visits).  Please refer to the patient's chart (MyChart message for video visits and phone note for telephone visits) for the patient's consent to telehealth for [facility]  Date:  01/18/2019   ID:  Mallory Jensen, DOB [birth year], MRN [id number]  Patient Location: *** [address] [place] [address]   Provider location:   [facility] [address], [place] [address]  PCP:  Haywood Pao, MD  Cardiologist:  Peter Martinique, MD *** Electrophysiologist:  None   Chief Complaint: Follow-up  History of Present Illness:    Mallory Jensen is a 85 y.o. female who presents via audio/video conferencing for a telehealth visit today.  Patient verified DOB and address.  The patient {does/does not:200015} symptoms concerning for COVID-19 infection (fever, chills, cough, or new SHORTNESS OF BREATH).   Mallory Jensen is a past medical history of hypertension, RBBB, mild aortic stenosis, pancreatitis, thoracic compression fracture, hypercholesterolemia, and altered mental status.  She was last seen by Dr. Martinique on 03/30/2018.  During that time she reported that she had had double pneumonia in January and recovered well.  She had no complaints of dyspnea, cough, fever, or chest pain.  Her biggest complaint at that time was left carpal tunnel like symptoms.  She was also noted to have some chronic edema at that time.  She presented to the clinic 01/15/2019, with her daughter, after also  seeing her PCP that morning for increased work of breathing.  She stated after starting her amlodipine 2-1/2 weeks ago she had noticed increased leg swelling and increased shortness of breath with activity.  She stated she followed a low-sodium diet did not drink excess fluids and was very physically active until COVID-19.  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  She presented to her PCP today who drew a BMP.  We requested those labs.  I  decreased her amlodipine to 5 mg daily, had her take Lasix 40 mg x 3 days, potassium 20 mEq x 3 days, and gave her the salty 6 sheet.  *** denies chest pain, shortness of breath, lower extremity edema, fatigue, palpitations, melena, hematuria, hemoptysis, diaphoresis, weakness, presyncope, syncope, orthopnea, and PND.  Prior CV studies:   The following studies were reviewed today:  ECG personally reviewed by me today-normal sinus rhythm right bundle branch block left anterior fascicular block possible lateral infarct undetermined age 53 bpm- No acute changes  EKG 08/28/2018 Sinus rhythm first-degree AV block right bundle branch block inferior infarct undetermined age 63 bpm  Echocardiogram 08/30/2012 Study Conclusions   - Left ventricle: The cavity size was normal. Wall thickness  was increased in a pattern of moderate LVH. Systolic  function was normal. The estimated ejection fraction was  in the range of 60% to 65%. Doppler parameters are  consistent with abnormal left ventricular relaxation  (grade 1 diastolic dysfunction).  - Mitral valve: Mildly calcified annulus.  - Left atrium: The atrium was mildly dilated.  Past Medical History:  Diagnosis Date  . Acute pancreatitis January 2014  . Chronic back pain   . Chronic fatigue   . Compression fracture of vertebral column ([place])   . Headache    PMH  . HTN (hypertension)   . Hypercholesteremia   . Macular hole   . Mild aortic  stenosis   . MRSA (methicillin resistant Staphylococcus aureus)   . Osteoporosis   . Ovarian cyst   . RBBB   . Staph infection    Past Surgical History:  Procedure Laterality Date  . ABDOMINAL HYSTERECTOMY    . APPENDECTOMY    . athroscopy Right shoulder    . Biopsy Left Breast x 2    . CESAREAN SECTION    . CHOLECYSTECTOMY    . PARS PLANA VITRECTOMY Left 10/09/2016   Procedure: PARS PLANA VITRECTOMY WITH 25 GAUGE;  Surgeon: Bernarda Caffey, MD;  Location: [facility];  Service: Ophthalmology;  Laterality: Left;  . PARS PLANA VITRECTOMY W/ REPAIR OF MACULAR HOLE Right 2015     No outpatient medications have been marked as taking for the 01/19/19 encounter (Appointment) with Deberah Pelton, NP.     Allergies:   Penicillins, Codeine, and Tramadol hcl   Social History   Tobacco Use  . Smoking status: Never Smoker  . Smokeless tobacco: Never Used  Substance Use Topics  . Alcohol use: Yes    Comment: glass of wine infrequently  . Drug use: No     Family Hx: The patient's family history includes Cancer in an other family member; Cataracts in her mother; Heart disease in her brother, brother and another family member. There is no history of Amblyopia, Blindness, Glaucoma, Macular degeneration, Retinal detachment, Strabismus, or Retinitis pigmentosa.  ROS:   Please see the history of present illness.     All other systems reviewed and are negative.   Labs/Other Tests and Data Reviewed:    Recent Labs: No results found for requested labs within last 8760 hours.   Recent Lipid Panel Lab Results  Component Value Date/Time   CHOL 76 03/16/2012 06:15 AM   TRIG 118 03/16/2012 06:15 AM   HDL 6 (L) 03/16/2012 06:15 AM   CHOLHDL 12.7 03/16/2012 06:15 AM   LDLCALC 46 03/16/2012 06:15 AM    Wt Readings from Last 3 Encounters:  01/15/19 151 lb 12.8 oz (68.9 kg)  11/23/18 149 lb (67.6 kg)  03/30/18 152 lb 6.4 oz (69.1 kg)     Exam:    Vital Signs:  There were no vitals taken  for this visit.   Well nourished, well developed female in no  acute distress.   ASSESSMENT & PLAN:     Lower Extremity edema- ***Bilateral lower extremity +2 pitting to her knee. Lower extremity support stockings  Elevate extremities when not active through the day Low sodium Heart healthy diet-salty 6 Increase physical activity as tolerated Lasix 40 mg x 3 days Potassium 20 mEq x 3 days Daily wts  Dyspnea on exertion- has had increased work of breathing over the last 2 weeks. Had taken Lasix at home but has not had great result.  She did not take any Lasix today. Take lasix 40 mg x 3 days  Take Potassium 20 MEQ x 3 days  BMP in one week  Essential Hypertension-BP today ***.  Will adjust blood pressure medications after diuresis. Reduce amlodipine to 5 mg daily Continue losartan 100 mg daily Heart healthy low-sodium diet Increase physical activity as tolerated  Chronic  Right Bundle Branch Block- EKG today showsnormal sinus rhythm right bundle branch block left anterior fascicular block possible lateral infarct undetermined age 65 bpm- Continue to monitor  COVID-19 Education: The signs and symptoms of COVID-19 were discussed with the patient and how to seek care for testing (follow up with PCP or arrange E-visit).  The importance of social distancing was discussed today.  Patient Risk:   After full review of this patients clinical status, I feel that they are at least moderate risk at this time.  Time:   Today, I have spent *** minutes with the patient with telehealth technology discussing ***.     Medication Adjustments/Labs and Tests Ordered: Current medicines are reviewed at length with the patient today.  Concerns regarding medicines are outlined above.   Tests Ordered: No orders of the defined types were placed in this encounter.  Medication Changes: No orders of the defined types were placed in this encounter.   Disposition:  {follow up:15908}  Jossie Ng.  [facility] [address] Office [phone] Fax [phone]

## 2019-01-19 ENCOUNTER — Telehealth: Payer: Self-pay | Admitting: *Deleted

## 2019-01-19 ENCOUNTER — Telehealth (INDEPENDENT_AMBULATORY_CARE_PROVIDER_SITE_OTHER): Payer: Medicare Other | Admitting: Adult Health

## 2019-01-19 ENCOUNTER — Telehealth: Payer: Medicare Other | Admitting: General Practice

## 2019-01-19 VITALS — BP 166/77 | HR 74 | Ht 60.0 in | Wt 146.5 lb

## 2019-01-19 DIAGNOSIS — R600 Localized edema: Secondary | ICD-10-CM

## 2019-01-19 DIAGNOSIS — Z79899 Other long term (current) drug therapy: Secondary | ICD-10-CM

## 2019-01-19 DIAGNOSIS — I10 Essential (primary) hypertension: Secondary | ICD-10-CM | POA: Diagnosis not present

## 2019-01-19 DIAGNOSIS — I4510 Unspecified right bundle-branch block: Secondary | ICD-10-CM

## 2019-01-19 MED ORDER — CHLORTHALIDONE 25 MG PO TABS: 12.5000 mg | ORAL_TABLET | Freq: Every day | ORAL | 3 refills | Status: DC

## 2019-01-19 NOTE — Telephone Encounter (Signed)
Follow up  Patient's daughter is returning call per the previous message.

## 2019-01-19 NOTE — Telephone Encounter (Signed)
[place] Visit Initial Request  Date of Request ([place]):  January 19, 2019  Requesting Provider:  Jory Sims, DNP    Agency Requested:    [facility] Contact:  Glory Buff, NP [address] [place], [place] [address] Phone #:  [phone] Fax #:  [phone]  Patient Demographic Information: Name:  Mallory Jensen Age:  85 y.o.   DOB:  1922/09/08  MRN:  [id number]   Address:   [address] [place] [address]   Phone Numbers:   Home Phone [phone]  Home Phone [phone]     Emergency Contact Information on File:   Contact Information    Name Relation Home Work Lynden Daughter [phone]    Shutt,Cindy Daughter [phone]     Lars Pinks [phone]  [phone]      The above family members may be contacted for information on this patient (review DPR on file):  Pt Daughter Jenny Reichmann [phone]   Patient Clinical Information:  Primary Care Provider:  Haywood Pao, MD  Primary Cardiologist:  Peter Martinique, MD  Primary Electrophysiologist:  None   Past Medical Hx: Mallory Jensen  has a past medical history of Acute pancreatitis (January 2014), Chronic back pain, Chronic fatigue, Compression fracture of vertebral column ([place]), Headache, HTN (hypertension), Hypercholesteremia, Macular hole, Mild aortic stenosis, MRSA (methicillin resistant Staphylococcus aureus), Osteoporosis, Ovarian cyst, RBBB, and Staph infection.   Allergies: She is allergic to penicillins; codeine; and tramadol hcl.   Medications: Current Outpatient Medications on File Prior to Visit  Medication Sig  . amLODipine (NORVASC) 5 MG tablet Take 1 tablet (5 mg total) by mouth daily.  Marland Kitchen aspirin EC 81 MG tablet Take 1 tablet (81 mg total) by mouth daily.  . calcium-vitamin D (OSCAL WITH D) 500-200 MG-UNIT per tablet Take 1 tablet by mouth daily.  . chlorthalidone (HYGROTON) 25 MG tablet Take 0.5  tablets (12.5 mg total) by mouth daily.  . Cholecalciferol (VITAMIN D3) 2000 units TABS Take 2,000 Units by mouth daily.  . furosemide (LASIX) 20 MG tablet TAKE 1-2 TABLETS ONCE DAILY AS NEEDED FOR FLUID RETENTION  . GINKGO BILOBA PO Take 1 tablet by mouth daily.  . Glucosamine-Chondroitin (COSAMIN DS PO) Take 1 tablet by mouth 2 (two) times daily.  . iron polysaccharides (FERREX 150) 150 MG capsule Take 150 mg by mouth daily.  Marland Kitchen losartan (COZAAR) 100 MG tablet Take 1 tablet (100 mg total) by mouth daily.  . metoprolol tartrate (LOPRESSOR) 25 MG tablet Take 12.5 mg by mouth 2 (two) times daily.  . Multiple Vitamin (MULTIVITAMIN WITH MINERALS) TABS Take 1 tablet by mouth daily. Centrum Silver  . naproxen sodium (ANAPROX) 220 MG tablet Take 220 mg by mouth 2 (two) times daily as needed (for pain.).  Marland Kitchen pantoprazole (PROTONIX) 40 MG tablet Take 1 tablet (40 mg total) by mouth daily.  Vladimir Faster Glycol-Propyl Glycol (SYSTANE ULTRA) 0.4-0.3 % SOLN Place 1-2 drops into both eyes 4 (four) times daily as needed (for dry eyes).  . potassium chloride SA (KLOR-CON) 20 MEQ tablet Take 1 tablet (20 mEq total) by mouth daily.   No current facility-administered medications on file prior to visit.      Social Hx: She  reports that she has never smoked. She has never used smokeless tobacco. She reports current alcohol use. She reports that she does not use drugs.    Diagnosis/Reason for Visit:   Hypertension/medications Management  Services Requested:  Labs:  BMP and Blood Pressure check  # of Visits Needed/Frequency per Week: 1

## 2019-01-19 NOTE — Telephone Encounter (Signed)
Spoke to patient's daughter Jenny Reichmann she stated mother will keep virtual appointment scheduled today with Jory Sims DNP.Follow up appointment scheduled with Dr.Jordan 04/02/19 at 3:20 pm.

## 2019-01-19 NOTE — Progress Notes (Signed)
Virtual Visit via Video Note   This visit type was conducted due to national recommendations for restrictions regarding the COVID-19 Pandemic (e.g. social distancing) in an effort to limit this patient's exposure and mitigate transmission in our community.  Due to her co-morbid illnesses, this patient is at least at moderate risk for complications without adequate follow up.  This format is felt to be most appropriate for this patient at this time.  All issues noted in this document were discussed and addressed.  A limited physical exam was performed with this format.  Please refer to the patient's chart for her consent to telehealth for [facility].   Date:  01/19/2019   ID:  Mallory Jensen, DOB 10-01-1922, MRN [id number]  Patient Location: Home Provider Location: Home  PCP:  Tisovec, Fransico Him, MD  Cardiologist:  Peter Martinique, MD  Electrophysiologist:  None   Evaluation Performed:  Follow-Up Visit  Chief Complaint:  Hypertension and edema  History of Present Illness:    Mallory Jensen is a 85 y.o. female with we are following for ongoing assessment and management for hypertension and chronic shortness of breath. She has a history of RBBB, mild aortic stenosis, pancreatitis, thoracic compression fracture., hypercholesterolemia, and AMS.   She was last seen by Mallory Memos, NP with complaints of dyspnea and LEE. He decreased her amlodipine to 5 mg, had her take lasix 40 mg for 3 days, and added potassium X 20 mEq for 3 days. She was to follow up with a BMET. She was also found to be hypertensive. Weight was 151 lbs.   Mallory Jensen states that she is doing some better today.  She has lost 5 pounds.  Her breathing has not greatly improved.  She has a lot of phlegm in the back of her throat.  Her legs are much better especially in the morning but she does have dependent edema throughout the day.  The edema is better with reduced dose of amlodipine.  Unfortunately, her blood  pressure is elevated.  She is deconditioned as she has not been out of her home in several months due to the pandemic.  She mostly walks around within her home.  Does get mildly short of breath walking down a long hallway to her bedroom and bathroom.  She denies chest pain, dizziness, or excessive fatigue.  Her daughter accompanies her on this video visit.  The patient does not have symptoms concerning for COVID-19 infection (fever, chills, cough, or new shortness of breath).    Past Medical History:  Diagnosis Date  . Acute pancreatitis January 2014  . Chronic back pain   . Chronic fatigue   . Compression fracture of vertebral column ([place])   . Headache    PMH  . HTN (hypertension)   . Hypercholesteremia   . Macular hole   . Mild aortic stenosis   . MRSA (methicillin resistant Staphylococcus aureus)   . Osteoporosis   . Ovarian cyst   . RBBB   . Staph infection    Past Surgical History:  Procedure Laterality Date  . ABDOMINAL HYSTERECTOMY    . APPENDECTOMY    . athroscopy Right shoulder    . Biopsy Left Breast x 2    . CESAREAN SECTION    . CHOLECYSTECTOMY    . PARS PLANA VITRECTOMY Left 10/09/2016   Procedure: PARS PLANA VITRECTOMY WITH 25 GAUGE;  Surgeon: Bernarda Caffey, MD;  Location: [facility];  Service: Ophthalmology;  Laterality: Left;  . PARS PLANA  VITRECTOMY W/ REPAIR OF MACULAR HOLE Right 2015     Current Meds  Medication Sig  . amLODipine (NORVASC) 5 MG tablet Take 1 tablet (5 mg total) by mouth daily.  Marland Kitchen aspirin EC 81 MG tablet Take 1 tablet (81 mg total) by mouth daily.  . calcium-vitamin D (OSCAL WITH D) 500-200 MG-UNIT per tablet Take 1 tablet by mouth daily.  . Cholecalciferol (VITAMIN D3) 2000 units TABS Take 2,000 Units by mouth daily.  . furosemide (LASIX) 20 MG tablet TAKE 1-2 TABLETS ONCE DAILY AS NEEDED FOR FLUID RETENTION  . GINKGO BILOBA PO Take 1 tablet by mouth daily.  . Glucosamine-Chondroitin (COSAMIN DS PO) Take 1 tablet by mouth 2 (two) times daily.   . iron polysaccharides (FERREX 150) 150 MG capsule Take 150 mg by mouth daily.  Marland Kitchen losartan (COZAAR) 100 MG tablet Take 1 tablet (100 mg total) by mouth daily.  . metoprolol tartrate (LOPRESSOR) 25 MG tablet Take 12.5 mg by mouth 2 (two) times daily.  . Multiple Vitamin (MULTIVITAMIN WITH MINERALS) TABS Take 1 tablet by mouth daily. Centrum Silver  . naproxen sodium (ANAPROX) 220 MG tablet Take 220 mg by mouth 2 (two) times daily as needed (for pain.).  Marland Kitchen pantoprazole (PROTONIX) 40 MG tablet Take 1 tablet (40 mg total) by mouth daily.  Vladimir Faster Glycol-Propyl Glycol (SYSTANE ULTRA) 0.4-0.3 % SOLN Place 1-2 drops into both eyes 4 (four) times daily as needed (for dry eyes).  . potassium chloride SA (KLOR-CON) 20 MEQ tablet Take 1 tablet (20 mEq total) by mouth daily.     Allergies:   Penicillins, Codeine, and Tramadol hcl   Social History   Tobacco Use  . Smoking status: Never Smoker  . Smokeless tobacco: Never Used  Substance Use Topics  . Alcohol use: Yes    Comment: glass of wine infrequently  . Drug use: No     Family Hx: The patient's family history includes Cancer in an other family member; Cataracts in her mother; Heart disease in her brother, brother and another family member. There is no history of Amblyopia, Blindness, Glaucoma, Macular degeneration, Retinal detachment, Strabismus, or Retinitis pigmentosa.  ROS:   Please see the history of present illness.    All other systems reviewed and are negative.   Prior CV studies:   The following studies were reviewed today: Echocardiogram 08/30/2012 Left ventricle: The cavity size was normal. Wall thickness  was increased in a pattern of moderate LVH. Systolic  function was normal. The estimated ejection fraction was  in the range of 60% to 65%. Doppler parameters are  consistent with abnormal left ventricular relaxation  (grade 1 diastolic dysfunction).  - Mitral valve: Mildly calcified annulus.  - Left atrium:  The atrium was mildly dilated.   Labs/Other Tests and Data Reviewed:    EKG:  No ECG reviewed.  Recent Labs: No results found for requested labs within last 8760 hours.   Recent Lipid Panel Lab Results  Component Value Date/Time   CHOL 76 03/16/2012 06:15 AM   TRIG 118 03/16/2012 06:15 AM   HDL 6 (L) 03/16/2012 06:15 AM   CHOLHDL 12.7 03/16/2012 06:15 AM   LDLCALC 46 03/16/2012 06:15 AM    Wt Readings from Last 3 Encounters:  01/19/19 146 lb 8 oz (66.5 kg)  01/15/19 151 lb 12.8 oz (68.9 kg)  11/23/18 149 lb (67.6 kg)     Objective:    Vital Signs:  BP (!) 166/77   Pulse 74   Ht  5' (1.524 m)   Wt 146 lb 8 oz (66.5 kg)   BMI 28.61 kg/m    VITAL SIGNS:  reviewed GEN:  no acute distress EYES:  sclerae anicteric, EOMI - Extraocular Movements Intact RESPIRATORY:  normal respiratory effort, symmetric expansion MUSCULOSKELETAL:  no obvious deformities. NEURO:  alert and oriented x 3, no obvious focal deficit PSYCH:  normal affect  ASSESSMENT & PLAN:    1. Hypertension: BP is more elevated today with reduction of amlodipine to 5 mg from 10 mg, but she has had improvement in LEE.  She has lost 5 lbs from last office visit.  I will add chlorthalidone 12.5 mg daily to her regimen in the hopes of decreasing BP and fluid retention without having to use lasix so often.  I will have a BMET drawn in her home via [facility] or [facility] this week. She will be seen on follow up by Mallory Jensen in 2 weeks to assess her status and response to medication addition. She is to take her BP at the same time every day and record this for the future appointment.    2. Chronic LEE: Multifactorial, obesity, sedentary lifestyle, and CCB. She is unable to wear support hose as she lives alone and cannot put them on by herself. She is encouraged to walk in her home several times a day to increase her stamina. Keep feet elevated when sitting. If she takes lasix she should also take potasium. BMET this week.     COVID-19 Education: The signs and symptoms of COVID-19 were discussed with the patient and how to seek care for testing (follow up with PCP or arrange E-visit).  The importance of social distancing was discussed today.  Time:   Today, I have spent 20 minutes with the patient with telehealth technology discussing the above problems.     Medication Adjustments/Labs and Tests Ordered: Current medicines are reviewed at length with the patient today.  Concerns regarding medicines are outlined above.   Tests Ordered: No orders of the defined types were placed in this encounter.   Medication Changes: No orders of the defined types were placed in this encounter.   Disposition:  Follow up 2 weeks with Mallory Memos, NP virtually   Signed, Phill Myron. [place] Pugh, ANP, AACC  01/19/2019 3:48 PM    [facility]

## 2019-01-19 NOTE — Patient Instructions (Signed)
Medication Instructions:  START- Chlorthalidone 12.5 mg by month daily  *If you need a refill on your cardiac medications before your next appointment, please call your pharmacy*  Lab Work: [facility]  If you have labs (blood work) drawn today and your tests are completely normal, you will receive your results only by: Marland Kitchen MyChart Message (if you have MyChart) OR . A paper copy in the mail If you have any lab test that is abnormal or we need to change your treatment, we will call you to review the results.  Testing/Procedures: None Ordered  Follow-Up: At [facility], you and your health needs are our priority.  As part of our continuing mission to provide you with exceptional heart care, we have created designated Provider Care Teams.  These Care Teams include your primary Cardiologist (physician) and Advanced Practice Providers (APPs -  Physician Assistants and Nurse Practitioners) who all work together to provide you with the care you need, when you need it.  Your next appointment:   2 Weeks  The format for your next appointment:   Virtual Visit   Provider:   Coletta Memos, FNP

## 2019-01-20 NOTE — Telephone Encounter (Signed)
I will forward Home Visit Request to Vertell Novak, NP and Chrissie Noa.

## 2019-01-22 DIAGNOSIS — Z79899 Other long term (current) drug therapy: Secondary | ICD-10-CM | POA: Diagnosis not present

## 2019-01-23 LAB — BASIC METABOLIC PANEL
BUN/Creatinine Ratio: 21 (ref 12–28)
BUN: 30 mg/dL (ref 10–36)
CO2: 21 mmol/L (ref 20–29)
Calcium: 9.7 mg/dL (ref 8.7–10.3)
Chloride: 105 mmol/L (ref 96–106)
Creatinine, Ser: 1.46 mg/dL — ABNORMAL HIGH (ref 0.57–1.00)
GFR calc Af Amer: 35 mL/min/{1.73_m2} — ABNORMAL LOW (ref >59)
GFR calc non Af Amer: 31 mL/min/{1.73_m2} — ABNORMAL LOW (ref >59)
Glucose: 90 mg/dL (ref 65–99)
Potassium: 5.8 mmol/L (ref 3.5–5.2)
Sodium: 138 mmol/L (ref 134–144)

## 2019-01-24 ENCOUNTER — Telehealth: Payer: Self-pay | Admitting: Cardiology

## 2019-01-24 NOTE — Telephone Encounter (Signed)
I was notified by [facility] of critical lab value potassium of 5.8 from Friday, 01/22/2019.  She was recently seen by telemedicine on 01/19/2019 at which time chlorthalidone was added.  She does take daily potassium.  The patient is stable with no significant complaints other than feeling a little drained.  Advised her not to take any more potassium for now.  She has her labs done per home health or remote health.  I advised her that we will recheck labs tomorrow, Monday. I will send this request to Jory Sims, NP as well as triage at [facility] to arrange for a Bmet on Monday.  The patient would like to be called before the person comes to her house for the lab.

## 2019-01-24 NOTE — Progress Notes (Signed)
Home Visit on behalf of [place] on 12/11 to draw labs. BMP drawn and brought to [facility] on [address]. Ms. Isler had no complaints

## 2019-01-25 ENCOUNTER — Other Ambulatory Visit: Payer: Self-pay | Admitting: Adult Health

## 2019-01-25 DIAGNOSIS — Z79899 Other long term (current) drug therapy: Secondary | ICD-10-CM | POA: Diagnosis not present

## 2019-01-25 NOTE — Telephone Encounter (Signed)
I left message on voicemail for Glory Buff, NP regarding need for lab work. Left message to call office.

## 2019-01-25 NOTE — Telephone Encounter (Signed)
I spoke with Vanita Ingles, RN and lab work was drawn today

## 2019-01-26 LAB — BASIC METABOLIC PANEL
BUN/Creatinine Ratio: 23 (ref 12–28)
BUN: 32 mg/dL (ref 10–36)
CO2: 21 mmol/L (ref 20–29)
Calcium: 9.1 mg/dL (ref 8.7–10.3)
Chloride: 106 mmol/L (ref 96–106)
Creatinine, Ser: 1.39 mg/dL — ABNORMAL HIGH (ref 0.57–1.00)
GFR calc Af Amer: 37 mL/min/{1.73_m2} — ABNORMAL LOW (ref >59)
GFR calc non Af Amer: 32 mL/min/{1.73_m2} — ABNORMAL LOW (ref >59)
Glucose: 96 mg/dL (ref 65–99)
Potassium: 5.1 mmol/L (ref 3.5–5.2)
Sodium: 140 mmol/L (ref 134–144)

## 2019-01-27 ENCOUNTER — Telehealth: Payer: Self-pay | Admitting: *Deleted

## 2019-01-27 DIAGNOSIS — Z79899 Other long term (current) drug therapy: Secondary | ICD-10-CM

## 2019-01-27 DIAGNOSIS — I10 Essential (primary) hypertension: Secondary | ICD-10-CM

## 2019-01-27 NOTE — Telephone Encounter (Signed)
[place] Visit Follow Up Request   Date of Request ([place]):  January 27, 2019  Requesting Provider:  Jory Sims    Agency Requested:    [facility] Contact:  Glory Buff, NP [address] [place],  [address] Phone #:  [phone] Fax #:  [phone]  Patient Demographic Information: Name:  Mallory Jensen Age:  85 y.o.   DOB:  [birth year]  MRN:  [id number]   Home visit progress note(s), lab results, telemetry strips, etc were reviewed.  Provider Recommendations: Repeat Labs  Follow up home services requested:  Labs:  BMP

## 2019-01-27 NOTE — Telephone Encounter (Signed)
Released labs

## 2019-01-27 NOTE — Telephone Encounter (Signed)
[place] Visit Follow Up Request   Date of Request ([place]):  January 27, 2019  Requesting Provider:  Jory Sims, DNP    Agency Requested:    [facility] Contact:  Glory Buff, NP [address] [place], [place] [address] Phone #:  [phone] Fax #:  [phone]  Patient Demographic Information: Name:  Mallory Jensen Age:  85 y.o.   DOB:  10-16-1922  MRN:  [id number]   Home visit progress note(s), lab results, telemetry strips, etc were reviewed.  Provider Recommendations: LAB WORK AND VITALS SIGNS  Follow up home services requested:  Vital Signs (BP, Pulse, O2, Weight)  Labs:  BMET

## 2019-01-27 NOTE — Telephone Encounter (Signed)
-----   Message from Lendon Colonel, NP sent at 01/25/2019  7:15 AM EST ----- I sent you a result note on this patient. She was asked by the weekend team to stop her potassium.  I asked that it be decreased to 10 mEq.  Let her hold the potassium and please have [facility] repeat lab draw, BMET today or tomorrow please.Patient wants to be called before they come.   KL

## 2019-01-27 NOTE — Addendum Note (Signed)
Addended by: Michae Kava on: 01/27/2019 11:56 AM   Modules accepted: Orders

## 2019-01-27 NOTE — Telephone Encounter (Deleted)
[place] Visit Initial Request  Date of Request ([place]):  January 27, 2019  Requesting Provider:  Jory Sims, DNP    Agency Requested:    [facility] Contact:  Mallory Buff, NP [address] [place], [place] [address] Phone #:  [phone] Fax #:  [phone]  Patient Demographic Information: Name:  Mallory Jensen Age:  85 y.o.   DOB:  [birth year]  MRN:  [id number]   Address:   [address] [place] [address]   Phone Numbers:   Home Phone [phone]  Home Phone [phone]     Emergency Contact Information on File:   Contact Information    Name Relation Home Work [place] Daughter [phone]    Shutt,Cindy Daughter [phone]     Lars Pinks [phone]  [phone]      The above family members may be contacted for information on this patient (review DPR on file):  Pt daughter Jenny Reichmann [phone]   Patient Clinical Information:  Primary Care Provider:  Haywood Pao, MD  Primary Cardiologist:  Mallory Martinique, MD  Primary Electrophysiologist:  None   Past Medical Hx: Mallory Jensen  has a past medical history of Acute pancreatitis (January 2014), Chronic back pain, Chronic fatigue, Compression fracture of vertebral column ([place]), Headache, HTN (hypertension), Hypercholesteremia, Macular hole, Mild aortic stenosis, MRSA (methicillin resistant Staphylococcus aureus), Osteoporosis, Ovarian cyst, RBBB, and Staph infection.   Allergies: She is allergic to penicillins; codeine; and tramadol hcl.   Medications: Current Outpatient Medications on File Prior to Visit  Medication Sig  . amLODipine (NORVASC) 5 MG tablet Take 1 tablet (5 mg total) by mouth daily.  Marland Kitchen aspirin EC 81 MG tablet Take 1 tablet (81 mg total) by mouth daily.  . calcium-vitamin D (OSCAL WITH D) 500-200 MG-UNIT per tablet Take 1 tablet by mouth daily.  . chlorthalidone (HYGROTON) 25 MG tablet Take 0.5  tablets (12.5 mg total) by mouth daily.  . Cholecalciferol (VITAMIN D3) 2000 units TABS Take 2,000 Units by mouth daily.  . furosemide (LASIX) 20 MG tablet TAKE 1-2 TABLETS ONCE DAILY AS NEEDED FOR FLUID RETENTION  . GINKGO BILOBA PO Take 1 tablet by mouth daily.  . Glucosamine-Chondroitin (COSAMIN DS PO) Take 1 tablet by mouth 2 (two) times daily.  . iron polysaccharides (FERREX 150) 150 MG capsule Take 150 mg by mouth daily.  Marland Kitchen losartan (COZAAR) 100 MG tablet Take 1 tablet (100 mg total) by mouth daily.  . metoprolol tartrate (LOPRESSOR) 25 MG tablet Take 12.5 mg by mouth 2 (two) times daily.  . Multiple Vitamin (MULTIVITAMIN WITH MINERALS) TABS Take 1 tablet by mouth daily. Centrum Silver  . naproxen sodium (ANAPROX) 220 MG tablet Take 220 mg by mouth 2 (two) times daily as needed (for pain.).  Marland Kitchen pantoprazole (PROTONIX) 40 MG tablet Take 1 tablet (40 mg total) by mouth daily.  Vladimir Faster Glycol-Propyl Glycol (SYSTANE ULTRA) 0.4-0.3 % SOLN Place 1-2 drops into both eyes 4 (four) times daily as needed (for dry eyes).  . potassium chloride SA (KLOR-CON) 20 MEQ tablet Take 1 tablet (20 mEq total) by mouth daily.   No current facility-administered medications on file prior to visit.     Social Hx: She  reports that she has never smoked. She has never used smokeless tobacco. She reports current alcohol use. She reports that she does not use drugs.    Diagnosis/Reason for Visit:   Hypertension/edication management  Services Requested:  Labs:  BMP and Blood pressure  # of Visits Needed/Frequency per Week: 1

## 2019-01-28 ENCOUNTER — Telehealth: Payer: Self-pay | Admitting: Cardiology

## 2019-01-28 ENCOUNTER — Telehealth: Payer: Self-pay

## 2019-01-28 NOTE — Telephone Encounter (Signed)
  Family states that lab results showed up on patient's MyChart but they would like a call to go over results. They do not use MyChart and would like to make sure that the office is aware that they would prefer communication by phone and not [place].

## 2019-01-28 NOTE — Telephone Encounter (Signed)
[id number] 

## 2019-01-28 NOTE — Telephone Encounter (Signed)
Opened in error

## 2019-01-29 NOTE — Telephone Encounter (Signed)
Follow up    Mallory Jensen is returning a call for results    Please call her mobile number [phone]

## 2019-01-29 NOTE — Telephone Encounter (Signed)
I spoke to the patient's daughter [name]) [phone] about lab results.  She would prefer a call in the future, they do not use MyChart.  She also mentioned that at the Virtual visit with Ricka Burdock 12/8, it was mentioned that she would have f/u with Coletta Memos in 2 weeks, again virtually.    An appointment has been made for 02/16/19.  They are looking for something sooner.  Please advise, thank you.

## 2019-01-30 LAB — BASIC METABOLIC PANEL
BUN/Creatinine Ratio: 23 (ref 12–28)
BUN: 35 mg/dL (ref 10–36)
CO2: 21 mmol/L (ref 20–29)
Calcium: 9.6 mg/dL (ref 8.7–10.3)
Chloride: 105 mmol/L (ref 96–106)
Creatinine, Ser: 1.51 mg/dL — ABNORMAL HIGH (ref 0.57–1.00)
GFR calc Af Amer: 34 mL/min/{1.73_m2} — ABNORMAL LOW (ref >59)
GFR calc non Af Amer: 29 mL/min/{1.73_m2} — ABNORMAL LOW (ref >59)
Glucose: 107 mg/dL — ABNORMAL HIGH (ref 65–99)
Potassium: 4.9 mmol/L (ref 3.5–5.2)
Sodium: 142 mmol/L (ref 134–144)

## 2019-01-31 NOTE — Progress Notes (Signed)
Home Visit on 12/18 on behalf of [facility] to draw BMET and take VS.   BMET brought to the [facility] on [address]   VS: 126/58, 69, 18, 96%RA  No complaints.  She stated she was going to be baking cookies today.

## 2019-01-31 NOTE — Telephone Encounter (Signed)
You might want to ask Coletta Memos if he will see this patient sooner and add it to his schedule. Check with him.

## 2019-02-01 NOTE — Telephone Encounter (Signed)
Returned call to pts Mallory Jensen, she was asking why pt's f/u appt was scheduled for 1 month when she needed to f/u in 2 weeks to discuss her medications/lab results. Discussed lab results and scheduled f/u appointment for tomorrow. Explained that she was fine to keep appt but she states that she wants to keep pt "on track"

## 2019-02-01 NOTE — Progress Notes (Signed)
Virtual Visit via Video Note   This visit type was conducted due to national recommendations for restrictions regarding the COVID-19 Pandemic (e.g. social distancing) in an effort to limit this patient's exposure and mitigate transmission in our community.  Due to her co-morbid illnesses, this patient is at least at moderate risk for complications without adequate follow up.  This format is felt to be most appropriate for this patient at this time.  All issues noted in this document were discussed and addressed.  A limited physical exam was performed with this format.  Please refer to the patient's chart for her consent to telehealth for [facility].  Evaluation Performed:  Follow-up visit  This visit type was conducted due to national recommendations for restrictions regarding the COVID-19 Pandemic (e.g. social distancing).  This format is felt to be most appropriate for this patient at this time.  All issues noted in this document were discussed and addressed.  No physical exam was performed (except for noted visual exam findings with Video Visits).  Please refer to the patient's chart (MyChart message for video visits and phone note for telephone visits) for the patient's consent to telehealth for [facility]  Date:  02/02/2019   ID:  Mallory Jensen, DOB [birth year], MRN [id number]  Patient Location:  [place] [address]   Provider location:     [place] [address] Office [phone] Fax [phone]   PCP:  Haywood Pao, MD  Cardiologist:  Peter Martinique, MD  Electrophysiologist:  None   Chief Complaint: Follow-up lower extremity edema and hypertension  History of Present Illness:    Mallory Jensen is a 85 y.o. female who presents via audio/video conferencing for a telehealth visit today.  Patient verified DOB and address.  The patient does not symptoms concerning for COVID-19 infection  (fever, chills, cough, or new SHORTNESS OF BREATH).   Ms. Schiller is a past medical history of hypertension, RBBB, mild aortic stenosis, pancreatitis, thoracic compression fracture, hypercholesterolemia, and altered mental status.  She was last seen by Dr. Martinique on 03/30/2018.  During that time she reported that she had had double pneumonia in January and recovered well.  She had no complaints of dyspnea, cough, fever, or chest pain.  Her biggest complaint at that time was left carpal tunnel like symptoms.  She was also noted to have some chronic edema at that time.  She presented to the clinic 01/15/2019, with her daughter, after also seeing her PCP this morning for increased work of breathing.  She stated after starting her amlodipine 2-1/2 weeks ago she  noticed increased leg swelling and increased shortness of breath with activity.  She stated she followed a low-sodium diet did not drink excess fluids and was very physically active until COVID-19.  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   Her PCP  drew a BMP that day.  We  requested those labs.  I  decreased her amlodipine to 5 mg daily, had her take Lasix 40 mg x 3 days, potassium 20 mEq x 3 days, and gave her the salty 6 sheet.  She was seen by Jory Sims, DNP on 01/19/2019.  During that time she had lost 5 pounds and chlorthalidone 12.5 was added to her medication regimen.  Her BMP was rechecked on 01/29/2019 and showed that her creatinine was elevated at 1.51 at that time.  Her baseline creatinine is around 1.08.  She is seen virtually today with her daughter and states her leg swelling is much better. She was able to bake 300 cookies this morning. She does feel like she has some extra fluid around her mid section. She continues to weigh herself daily and is 140 pounds today, down from 151.8 pounds on 01/15/2019. She continues to eat a low-sodium diet.  Her blood pressure has also  been better controlled with the addition of chlorthalidone.  She does still notice increased work of breathing with vigorous activities but normal breathing with routine daily activities.  I will repeat her BMP in 1 week and have her follow-up in 1 month.  She denies chest pain, shortness of breath, lower extremity edema, fatigue, palpitations, melena, hematuria, hemoptysis, diaphoresis, weakness, presyncope, syncope, orthopnea, and PND.   Prior CV studies:   The following studies were reviewed today:  EKG 01/15/2019 normal sinus rhythm right bundle branch block left anterior fascicular block possible lateral infarct undetermined age 15 bpm- No acute changes  EKG 08/28/2018 Sinus rhythm first-degree AV block right bundle branch block inferior infarct undetermined age 8 bpm  Echocardiogram 08/30/2012 Study Conclusions   - Left ventricle: The cavity size was normal. Wall thickness  was increased in a pattern of moderate LVH. Systolic  function was normal. The estimated ejection fraction was  in the range of 60% to 65%. Doppler parameters are  consistent with abnormal left ventricular relaxation  (grade 1 diastolic dysfunction).  - Mitral valve: Mildly calcified annulus.  - Left atrium: The atrium was mildly dilated.  Impressions:   Past Medical History:  Diagnosis Date  . Acute pancreatitis January 2014  . Chronic back pain   . Chronic fatigue   . Compression fracture of vertebral column ([place])   . Headache    PMH  . HTN (hypertension)   . Hypercholesteremia   . Macular hole   . Mild aortic stenosis   . MRSA (methicillin resistant Staphylococcus aureus)   . Osteoporosis   . Ovarian cyst   . RBBB   . Staph infection    Past Surgical History:  Procedure Laterality Date  . ABDOMINAL HYSTERECTOMY    . APPENDECTOMY    . athroscopy Right shoulder    . Biopsy Left Breast x 2    . CESAREAN SECTION    . CHOLECYSTECTOMY    . PARS PLANA VITRECTOMY Left 10/09/2016    Procedure: PARS PLANA VITRECTOMY WITH 25 GAUGE;  Surgeon: Bernarda Caffey, MD;  Location: [place];  Service: Ophthalmology;  Laterality: Left;  . PARS PLANA VITRECTOMY W/ REPAIR OF MACULAR HOLE Right 2015     No outpatient medications have been marked as taking for the 02/02/19 encounter (Appointment) with Deberah Pelton, NP.     Allergies:   Penicillins, Codeine, and Tramadol hcl   Social History   Tobacco Use  . Smoking status: Never Smoker  . Smokeless tobacco: Never Used  Substance Use Topics  . Alcohol use: Yes    Comment: glass of wine infrequently  . Drug use: No     Family Hx: The patient's family history includes Cancer in an other family member; Cataracts in her mother; Heart disease in her brother, brother and another family member. There is no history of Amblyopia, Blindness, Glaucoma, Macular degeneration, Retinal detachment, Strabismus, or Retinitis pigmentosa.  ROS:   Please see the history of present illness.     All other systems reviewed and are negative.   Labs/Other  Tests and Data Reviewed:    Recent Labs: 01/29/2019: BUN 35; Creatinine, Ser 1.51; Potassium 4.9; Sodium 142   Recent Lipid Panel Lab Results  Component Value Date/Time   CHOL 76 03/16/2012 06:15 AM   TRIG 118 03/16/2012 06:15 AM   HDL 6 (L) 03/16/2012 06:15 AM   CHOLHDL 12.7 03/16/2012 06:15 AM   LDLCALC 46 03/16/2012 06:15 AM    Wt Readings from Last 3 Encounters:  01/19/19 146 lb 8 oz (66.5 kg)  01/15/19 151 lb 12.8 oz (68.9 kg)  11/23/18 149 lb (67.6 kg)     Exam:    Vital Signs:  There were no vitals taken for this visit.   Well nourished, well developed female in no  acute distress.   ASSESSMENT & PLAN:    1.  Acute kidney injury-creatinine 1.51 01/29/2019.  Her baseline creatinine is around 1.08.  This appears to be a result of her diuresis with Lasix and increased blood pressure.  Consider discontinuing chlorthalidone and switching metoprolol to carvedilol if creatinine  continues to be elevated. Recheck BMP in 1 week  Dyspnea on exertion-continues with vigorous activities.  She is back to all her normal baking and does not notice increased work of breathing with routine activities. Continue chlorthalidone Continue amlodipine Continue losartan Continue metoprolol-consider changing to carvedilol BMP in one week   Lower Extremity edema-no edema today.  Instructed to continue to observe for lower extremity edema and maintain diet. lower extremity support stockings-too difficult for her to manage given her age. Elevate extremities when not active through the day Low sodium Heart healthy diet-salty 6 Increase physical activity as tolerated Continue Daily wts  Essential Hypertension-BP today 126/58 .    Well-controlled at home Continue amlodipine to 5 mg daily Continue losartan 100 mg daily Continue metoprolol Continue chlorthalidone Heart healthy low-sodium diet Increase physical activity as tolerated Blood pressure log  Chronic Right Bundle Branch Block- EKG today showsnormal sinus rhythm right bundle branch block left anterior fascicular block possible lateral infarct undetermined age 45 bpm- Continue to monitor  COVID-19 Education: The signs and symptoms of COVID-19 were discussed with the patient and how to seek care for testing (follow up with PCP or arrange E-visit).  The importance of social distancing was discussed today.  Patient Risk:   After full review of this patients clinical status, I feel that they are at least moderate risk at this time.  Time:   Today, I have spent 15 minutes with the patient with telehealth technology discussing blood pressure, lower extremity edema, diet, physical activity, blood pressure medications, and lab work.     Medication Adjustments/Labs and Tests Ordered: Current medicines are reviewed at length with the patient today.  Concerns regarding medicines are outlined above.   Tests Ordered: No orders of  the defined types were placed in this encounter.  Medication Changes: No orders of the defined types were placed in this encounter.   Disposition:  in 1 month(s) with APP or Dr. Martinique  Signed,   Jossie Ng. [facility] [place] [address] Office [phone] Fax [phone]

## 2019-02-02 ENCOUNTER — Encounter: Payer: Self-pay | Admitting: General Practice

## 2019-02-02 ENCOUNTER — Telehealth: Payer: Self-pay | Admitting: *Deleted

## 2019-02-02 ENCOUNTER — Telehealth (INDEPENDENT_AMBULATORY_CARE_PROVIDER_SITE_OTHER): Payer: Medicare Other | Admitting: General Practice

## 2019-02-02 ENCOUNTER — Telehealth: Payer: Self-pay

## 2019-02-02 VITALS — BP 133/60 | HR 60 | Resp 20 | Ht 60.0 in | Wt 140.0 lb

## 2019-02-02 DIAGNOSIS — N179 Acute kidney failure, unspecified: Secondary | ICD-10-CM

## 2019-02-02 NOTE — Telephone Encounter (Signed)
[place] Visit Follow Up Request   Date of Request ([place]):  February 02, 2019  Requesting Provider:  Coletta Memos, FNP    Agency Requested:    [facility] Contact:  Glory Buff, NP [address] [place], [place] [address] Phone #:  [phone] Fax #:  [phone]  Patient Demographic Information: Name:  Mallory Jensen Age:  85 y.o.   DOB:  Jul 17, 1922  MRN:  [id number]   Home visit progress note(s), lab results, telemetry strips, etc were reviewed.  Provider Recommendations: REPEAT LABS  Follow up home services requested:  Vital Signs (BP, Pulse, O2, Weight)  Labs:  BMET

## 2019-02-02 NOTE — Patient Instructions (Addendum)
  Medication Instructions: No Changes *If you need a refill on your cardiac medications before your next appointment, please call your pharmacy*  Lab Work: BMP in 1 week  If you have labs (blood work) drawn today and your tests are completely normal, you will receive your results only by: Marland Kitchen MyChart Message (if you have MyChart) OR . A paper copy in the mail If you have any lab test that is abnormal or we need to change your treatment, we will call you to review the results.   Follow-Up: At [facility] you and your health needs are our priority.  As part of our continuing mission to provide you with exceptional heart care, we have created designated Provider Care Teams.  These Care Teams include your primary Cardiologist (physician) and Advanced Practice Providers (APPs -  Physician Assistants and Nurse Practitioners) who all work together to provide you with the care you need, when you need it.  Your next appointment:   1 month(s)  The format for your next appointment:   Virtual Visit   Provider:   You may see Peter Martinique, MD , Coletta Memos, NP or one of the following Advanced Practice Providers on your designated Care Team:    Almyra Deforest, PA-C  Fabian Sharp, [place] or   Roby Lofts, [place]   Other Instructions Follow [place] dietary recommendations handout. Continue with current  physical activity.

## 2019-02-02 NOTE — Telephone Encounter (Signed)
Orders have been placed and faxed to Glory Buff, NP and to [employer].

## 2019-02-02 NOTE — Telephone Encounter (Signed)
I placed the order.

## 2019-02-02 NOTE — Telephone Encounter (Signed)
[place] Visit Follow Up Request   Date of Request ([place]):  February 02, 2019  Requesting Provider:  Coletta Memos, FNP  Agency Requested:    [facility] Contact:  Glory Buff, NP [address] [place],  [address] Phone #:  [phone] Fax #:  [phone]  Patient Demographic Information: Name:  Mallory Jensen Age:  85 y.o.   DOB:  23-May-1922  MRN:  [id number]   Home visit progress note(s), lab results, telemetry strips, etc were reviewed.  Provider Recommendations: Repeat Labs  Follow up home services requested:  Labs:  BMP

## 2019-02-10 NOTE — Progress Notes (Signed)
Home visit on behalf of [facility] to draw labs.  Took 3 attempts and small amount of blood brought to [facility] but had to return to get more blood since the amount the first time was insufficient.  Second trip to see her took several attempts as well, but sufficient amount of blood was again brought to the [facility] on [address]

## 2019-02-11 LAB — BASIC METABOLIC PANEL
BUN/Creatinine Ratio: 30 — ABNORMAL HIGH (ref 12–28)
BUN: 49 mg/dL — ABNORMAL HIGH (ref 10–36)
CO2: 19 mmol/L — ABNORMAL LOW (ref 20–29)
Calcium: 9 mg/dL (ref 8.7–10.3)
Chloride: 105 mmol/L (ref 96–106)
Creatinine, Ser: 1.62 mg/dL — ABNORMAL HIGH (ref 0.57–1.00)
GFR calc Af Amer: 31 mL/min/{1.73_m2} — ABNORMAL LOW (ref >59)
GFR calc non Af Amer: 27 mL/min/{1.73_m2} — ABNORMAL LOW (ref >59)
Glucose: 127 mg/dL — ABNORMAL HIGH (ref 65–99)
Potassium: 5.1 mmol/L (ref 3.5–5.2)
Sodium: 140 mmol/L (ref 134–144)

## 2019-02-15 NOTE — Progress Notes (Signed)
Virtual Visit via Telephone Note   This visit type was conducted due to national recommendations for restrictions regarding the COVID-19 Pandemic (e.g. social distancing) in an effort to limit this patient's exposure and mitigate transmission in our community.  Due to her co-morbid illnesses, this patient is at least at moderate risk for complications without adequate follow up.  This format is felt to be most appropriate for this patient at this time.  The patient did not have access to video technology/had technical difficulties with video requiring transitioning to audio format only (telephone).  All issues noted in this document were discussed and addressed.  No physical exam could be performed with this format.  Please refer to the patient's chart for her  consent to telehealth for [facility].  Evaluation Performed:  Follow-up visit  This visit type was conducted due to national recommendations for restrictions regarding the COVID-19 Pandemic (e.g. social distancing).  This format is felt to be most appropriate for this patient at this time.  All issues noted in this document were discussed and addressed.  No physical exam was performed (except for noted visual exam findings with Video Visits).  Please refer to the patient's chart (MyChart message for video visits and phone note for telephone visits) for the patient's consent to telehealth for [facility]  Date:  02/16/2019   ID:  Mallory Jensen, DOB January 30, 1923, MRN [id number]  Patient Location:  [place] [address]   Provider location:   [place] [address] Office [phone] Fax [phone]   PCP:  Haywood Pao, MD  Cardiologist:  Peter Martinique, MD  Electrophysiologist:  None   Chief Complaint:  Follow-up lower extremity edema, AKI and hypertension.  History of Present Illness:    Mallory Jensen is a 86 y.o. female who presents via  audio/video conferencing for a telehealth visit today.  Patient verified DOB and address.  The patient does not symptoms concerning for COVID-19 infection (fever, chills, cough, or new SHORTNESS OF BREATH).   Ms. Mcmurtry is a past medical history of hypertension, RBBB, mild aortic stenosis, pancreatitis, thoracic compression fracture, hypercholesterolemia, and altered mental status.  She was last seen by Dr. Martinique on 03/30/2018. During that time she reported that she had had double pneumonia in January and recovered well. She had no complaints of dyspnea, cough, fever, or chest pain. Her biggest complaint at that time was left carpal tunnel like symptoms. She was also noted to have some chronic edema at that time.  She presented to the clinic 01/15/2019, with her daughter, after also seeing her PCP this morning for increased work of breathing. She statedafter starting her amlodipine 2-1/2 weeks ago she  noticed increased leg swelling and increased shortness of breath with activity. She stated she followed a low-sodium diet did not drink excess fluids and was very physically active until COVID-19. 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  Her PCP  drew a BMP that day. We  requested those labs. I  decreased her amlodipine to 5 mg daily, had her take Lasix 40 mg x 3 days, potassium 20 mEq x 3 days, and gave her the salty 6 sheet.  She was seen by Jory Sims, DNP on 01/19/2019.  During that time she had lost 5 pounds and chlorthalidone 12.5 was added to her medication regimen.  Her BMP was rechecked on 01/29/2019 and  showed that her creatinine was elevated at 1.51 at that time.  Her baseline creatinine is around 1.08.  She was seen virtually by me with her daughter on 02/02/2019 and stated her leg swelling was much better. She was able to bake 300 cookies this morning. She did feel like she had some extra fluid around her mid  section. She continued to weigh herself daily and was 140 pounds, down from 151.8 pounds on 01/15/2019. She continued to eat a low-sodium diet.  Her blood pressure had also been better controlled with the addition of chlorthalidone.  She did still notice increased work of breathing with vigorous activities but normal breathing with routine daily activities.  I repeated her BMP and she is seen today for follow-up.  She states she feels well.  She continues to weigh herself daily and follow a low-sodium diet.  Her weight today is 141 pounds.  This is her dry weight.  She continues to be physically active and bake.  Due to her acute kidney injury I will stop her chlorthalidone and metoprolol.  Start carvedilol and repeat a BMP in 2 weeks.  She will follow-up with Dr. Martinique in 1 month.  She denies chest pain, increased shortness of breath, lower extremity edema, fatigue, palpitations, melena, hematuria, hemoptysis, diaphoresis, weakness, presyncope, syncope, orthopnea, and PND.   Prior CV studies:   The following studies were reviewed today:  EKG 01/15/2019 normal sinus rhythm right bundle branch block left anterior fascicular block possible lateral infarct undetermined age 30 bpm- No acute changes  EKG 08/28/2018 Sinus rhythm first-degree AV block right bundle branch block inferior infarct undetermined age 66 bpm  Echocardiogram 08/30/2012 Study Conclusions   - Left ventricle: The cavity size was normal. Wall thickness  was increased in a pattern of moderate LVH. Systolic  function was normal. The estimated ejection fraction was  in the range of 60% to 65%. Doppler parameters are  consistent with abnormal left ventricular relaxation  (grade 1 diastolic dysfunction).  - Mitral valve: Mildly calcified annulus.  - Left atrium: The atrium was mildly dilated.  Past Medical History:  Diagnosis Date  . Acute pancreatitis January 2014  . Chronic back pain   . Chronic fatigue   .  Compression fracture of vertebral column ([place])   . Headache    PMH  . HTN (hypertension)   . Hypercholesteremia   . Macular hole   . Mild aortic stenosis   . MRSA (methicillin resistant Staphylococcus aureus)   . Osteoporosis   . Ovarian cyst   . RBBB   . Staph infection    Past Surgical History:  Procedure Laterality Date  . ABDOMINAL HYSTERECTOMY    . APPENDECTOMY    . athroscopy Right shoulder    . Biopsy Left Breast x 2    . CESAREAN SECTION    . CHOLECYSTECTOMY    . PARS PLANA VITRECTOMY Left 10/09/2016   Procedure: PARS PLANA VITRECTOMY WITH 25 GAUGE;  Surgeon: Bernarda Caffey, MD;  Location: [place];  Service: Ophthalmology;  Laterality: Left;  . PARS PLANA VITRECTOMY W/ REPAIR OF MACULAR HOLE Right 2015     Current Meds  Medication Sig  . amLODipine (NORVASC) 5 MG tablet Take 1 tablet (5 mg total) by mouth daily.  Marland Kitchen aspirin EC 81 MG tablet Take 1 tablet (81 mg total) by mouth daily.  . calcium-vitamin D (OSCAL WITH D) 500-200 MG-UNIT per tablet Take 1 tablet by mouth daily.  . Cholecalciferol (VITAMIN D3) 2000 units TABS  Take 2,000 Units by mouth daily.  . furosemide (LASIX) 20 MG tablet Take 1 tablet (20 mg total) by mouth daily as needed for edema.  Marland Kitchen GINKGO BILOBA PO Take 1 tablet by mouth daily.  . Glucosamine-Chondroitin (COSAMIN DS PO) Take 1 tablet by mouth 2 (two) times daily.  . iron polysaccharides (FERREX 150) 150 MG capsule Take 150 mg by mouth daily.  Marland Kitchen losartan (COZAAR) 100 MG tablet Take 1 tablet (100 mg total) by mouth daily.  . Multiple Vitamin (MULTIVITAMIN WITH MINERALS) TABS Take 1 tablet by mouth daily. Centrum Silver  . naproxen sodium (ANAPROX) 220 MG tablet Take 220 mg by mouth 2 (two) times daily as needed (for pain.).  Marland Kitchen pantoprazole (PROTONIX) 40 MG tablet Take 1 tablet (40 mg total) by mouth daily.  Vladimir Faster Glycol-Propyl Glycol (SYSTANE ULTRA) 0.4-0.3 % SOLN Place 1-2 drops into both eyes 4 (four) times daily as needed (for dry eyes).  .  potassium chloride SA (KLOR-CON) 20 MEQ tablet Take 1 tablet (20 mEq total) by mouth daily.  . [DISCONTINUED] chlorthalidone (HYGROTON) 25 MG tablet Take 0.5 tablets (12.5 mg total) by mouth daily.  . [DISCONTINUED] furosemide (LASIX) 20 MG tablet TAKE 1-2 TABLETS ONCE DAILY AS NEEDED FOR FLUID RETENTION  . [DISCONTINUED] metoprolol tartrate (LOPRESSOR) 25 MG tablet Take 12.5 mg by mouth 2 (two) times daily.     Allergies:   Penicillins, Codeine, and Tramadol hcl   Social History   Tobacco Use  . Smoking status: Never Smoker  . Smokeless tobacco: Never Used  Substance Use Topics  . Alcohol use: Yes    Comment: glass of wine infrequently  . Drug use: No     Family Hx: The patient's family history includes Cancer in an other family member; Cataracts in her mother; Heart disease in her brother, brother and another family member. There is no history of Amblyopia, Blindness, Glaucoma, Macular degeneration, Retinal detachment, Strabismus, or Retinitis pigmentosa.  ROS:   Please see the history of present illness.     All other systems reviewed and are negative.   Labs/Other Tests and Data Reviewed:    Recent Labs: 02/10/2019: BUN 49; Creatinine, Ser 1.62; Potassium 5.1; Sodium 140   Recent Lipid Panel Lab Results  Component Value Date/Time   CHOL 76 03/16/2012 06:15 AM   TRIG 118 03/16/2012 06:15 AM   HDL 6 (L) 03/16/2012 06:15 AM   CHOLHDL 12.7 03/16/2012 06:15 AM   LDLCALC 46 03/16/2012 06:15 AM    Wt Readings from Last 3 Encounters:  02/16/19 141 lb (64 kg)  02/02/19 140 lb (63.5 kg)  01/19/19 146 lb 8 oz (66.5 kg)     Exam:    Vital Signs:  BP 135/68   Pulse 70   Wt 141 lb (64 kg)   BMI 27.54 kg/m    Well nourished, well developed female in no  acute distress.   ASSESSMENT & PLAN:    1.  Acute kidney injury-creatinine 1.62 02/10/2019, 1.51 01/29/2019.  Her baseline creatinine is around 1.08.  This appears to be a result of her diuresis with Lasix and  increased blood pressure.   Stop chlorthalidone Stop metoprolol Start carvedilol 3.125 Repeat BMP in 2 weeks  Dyspnea on exertion-continues with vigorous activities.  She  continues her normal baking and does not notice increased work of breathing with routine activities. Stop chlorthalidone Stop metoprolol Continue amlodipine Continue losartan Start carvedilol  Lower Extremity edema-no edema today.  Instructed to continue to observe for lower  extremity edema and continue low-salt  diet. lower extremity support stockings-too difficult for her to manage given her age. Elevate extremities when not active through the day Low sodium Heart healthy diet-salty 6 Increase physical activity as tolerated Continue Daily wts  Essential Hypertension-BP today  135/68.   Well-controlled at home Continue amlodipine to 5 mg daily Continue losartan 100 mg daily Stop metoprolol Stop chlorthalidone Heart healthy low-sodium diet Increase physical activity as tolerated Blood pressure log  Chronic Right Bundle Branch Block-heart rate today 70. Continue to monitor  COVID-19 Education: The signs and symptoms of COVID-19 were discussed with the patient and how to seek care for testing (follow up with PCP or arrange E-visit).  The importance of social distancing was discussed today.  Patient Risk:   After full review of this patients clinical status, I feel that they are at least moderate risk at this time.  Time:   Today, I have spent 74minutes with the patient with telehealth technology discussing blood work, AKI, HTN, diet, and physical activity.     Medication Adjustments/Labs and Tests Ordered: Current medicines are reviewed at length with the patient today.  Concerns regarding medicines are outlined above.   Tests Ordered: Orders Placed This Encounter  Procedures  . BMET   Medication Changes: Meds ordered this encounter  Medications  . carvedilol (COREG) 3.125 MG tablet    Sig:  Take 1 tablet (3.125 mg total) by mouth 2 (two) times daily.    Dispense:  60 tablet    Refill:  3  . furosemide (LASIX) 20 MG tablet    Sig: Take 1 tablet (20 mg total) by mouth daily as needed for edema.    Dispense:  30 tablet    Disposition:  in 1 month(s)       Jossie Ng. [facility] [address] Office [phone] Fax [phone]

## 2019-02-16 ENCOUNTER — Telehealth: Payer: Self-pay | Admitting: *Deleted

## 2019-02-16 ENCOUNTER — Telehealth (INDEPENDENT_AMBULATORY_CARE_PROVIDER_SITE_OTHER): Payer: Medicare Other | Admitting: General Practice

## 2019-02-16 ENCOUNTER — Telehealth: Payer: Self-pay

## 2019-02-16 VITALS — BP 135/68 | HR 70 | Wt 141.0 lb

## 2019-02-16 DIAGNOSIS — R0600 Dyspnea, unspecified: Secondary | ICD-10-CM

## 2019-02-16 DIAGNOSIS — N179 Acute kidney failure, unspecified: Secondary | ICD-10-CM

## 2019-02-16 DIAGNOSIS — R600 Localized edema: Secondary | ICD-10-CM

## 2019-02-16 DIAGNOSIS — Z7189 Other specified counseling: Secondary | ICD-10-CM | POA: Diagnosis not present

## 2019-02-16 DIAGNOSIS — Z79899 Other long term (current) drug therapy: Secondary | ICD-10-CM

## 2019-02-16 DIAGNOSIS — I4510 Unspecified right bundle-branch block: Secondary | ICD-10-CM

## 2019-02-16 DIAGNOSIS — I10 Essential (primary) hypertension: Secondary | ICD-10-CM | POA: Diagnosis not present

## 2019-02-16 DIAGNOSIS — R0609 Other forms of dyspnea: Secondary | ICD-10-CM

## 2019-02-16 MED ORDER — CARVEDILOL 3.125 MG PO TABS: 3.1250 mg | ORAL_TABLET | Freq: Two times a day (BID) | ORAL | 3 refills | Status: DC

## 2019-02-16 MED ORDER — FUROSEMIDE 20 MG PO TABS: 20.0000 mg | ORAL_TABLET | Freq: Every day | ORAL | Status: AC | PRN

## 2019-02-16 NOTE — Patient Instructions (Addendum)
Medication Instructions:  STOP CHLORTHALIDONE  STOP METOPROLOL  START CARVEDILOL 3.125MG  TWICE DAILY  If you need a refill on your cardiac medications before your next appointment, please call your pharmacy.  Labwork: BMET IN 2 WEEKS (03-02-2019) HERE IN OUR OFFICE AT [facility]-You will NOT need to fast .  WE WILL NOTIFY [facility] FOR LAB DRAW, SOMEONE WILL BE CALLING YOU FOR THIS.  Special Instructions: CONTINUE SALTY 6  CONTINUE TO TAKE AND LOG BLOOD PRESSURE, HEART-RATE AND WEIGHT-PLEASE BRING THIS LOF WITH YOU TO YOUR FOLLOW UP APPOINTMENT  Reduce your risk of getting COVID-19 With your heart disease it is especially important for people at increased risk of severe illness from COVID-19, and those who live with them, to protect themselves from getting COVID-19. The best way to protect yourself and to help reduce the spread of the virus that causes COVID-19 is to: Marland Kitchen Limit your interactions with other people as much as possible. . Take precautions to prevent getting COVID-19 when you do interact with others. If you start feeling sick and think you may have COVID-19, get in touch with your healthcare provider within 24 hours.  Follow-Up: AS SCHEDULED 04-02-19 @3PM  In Person WITH Peter Martinique, MD.    At [facility], you and your health needs are our priority.  As part of our continuing mission to provide you with exceptional heart care, we have created designated Provider Care Teams.  These Care Teams include your primary Cardiologist (physician) and Advanced Practice Providers (APPs -  Physician Assistants and Nurse Practitioners) who all work together to provide you with the care you need, when you need it.  Thank you for choosing [facility] at [facility]!!    NAME:  DATE TIME WT BP  HR COMMENT DATE TIME WT BP  HR COMMENT

## 2019-02-16 NOTE — Telephone Encounter (Signed)
PLEASE ORDER [facility] FOR FOLLOW UP LABS IN 2 WEEKS 03-02-19.    Provider Recommendations: REPEAT LABS  Follow up home services requested:  Vital Signs (BP, Pulse, O2, Weight)  Labs:  BMET

## 2019-02-16 NOTE — Telephone Encounter (Signed)
[facility] orders have been sent

## 2019-02-16 NOTE — Telephone Encounter (Signed)
[facility] Visit Follow Up Request   Date of Request ([place]):  February 16, 2019  Requesting Provider:  Coletta Memos, NP    Agency Requested:    [facility] Contact:  Glory Buff, NP [address] [place], [place] [address] Phone #:  [phone] Fax #:  [phone]  Patient Demographic Information: Name:  Mallory Jensen Age:  86 y.o.   DOB:  12-10-1922  MRN:  [id number]   Home visit progress note(s), lab results, telemetry strips, etc were reviewed.  Provider Recommendations: REPEAT LABS (BMET) 03/02/19; VITAL SIGNS  Follow up home services requested:  Vital Signs (BP, Pulse, O2, Weight)  Labs:  BMET

## 2019-02-18 DIAGNOSIS — D044 Carcinoma in situ of skin of scalp and neck: Secondary | ICD-10-CM | POA: Diagnosis not present

## 2019-02-23 DIAGNOSIS — J189 Pneumonia, unspecified organism: Secondary | ICD-10-CM | POA: Diagnosis not present

## 2019-03-02 DIAGNOSIS — N179 Acute kidney failure, unspecified: Secondary | ICD-10-CM | POA: Diagnosis not present

## 2019-03-02 DIAGNOSIS — R600 Localized edema: Secondary | ICD-10-CM | POA: Diagnosis not present

## 2019-03-02 DIAGNOSIS — I10 Essential (primary) hypertension: Secondary | ICD-10-CM | POA: Diagnosis not present

## 2019-03-02 DIAGNOSIS — Z79899 Other long term (current) drug therapy: Secondary | ICD-10-CM | POA: Diagnosis not present

## 2019-03-02 DIAGNOSIS — E7800 Pure hypercholesterolemia, unspecified: Secondary | ICD-10-CM | POA: Diagnosis not present

## 2019-03-03 ENCOUNTER — Telehealth: Payer: Self-pay

## 2019-03-03 ENCOUNTER — Telehealth: Payer: Self-pay | Admitting: *Deleted

## 2019-03-03 DIAGNOSIS — I10 Essential (primary) hypertension: Secondary | ICD-10-CM

## 2019-03-03 DIAGNOSIS — N179 Acute kidney failure, unspecified: Secondary | ICD-10-CM

## 2019-03-03 DIAGNOSIS — R600 Localized edema: Secondary | ICD-10-CM

## 2019-03-03 DIAGNOSIS — Z79899 Other long term (current) drug therapy: Secondary | ICD-10-CM

## 2019-03-03 LAB — BASIC METABOLIC PANEL
BUN/Creatinine Ratio: 33 — ABNORMAL HIGH (ref 12–28)
BUN: 43 mg/dL — ABNORMAL HIGH (ref 10–36)
CO2: 21 mmol/L (ref 20–29)
Calcium: 9.1 mg/dL (ref 8.7–10.3)
Chloride: 102 mmol/L (ref 96–106)
Creatinine, Ser: 1.3 mg/dL — ABNORMAL HIGH (ref 0.57–1.00)
GFR calc Af Amer: 41 mL/min/{1.73_m2} — ABNORMAL LOW (ref >59)
GFR calc non Af Amer: 35 mL/min/{1.73_m2} — ABNORMAL LOW (ref >59)
Glucose: 85 mg/dL (ref 65–99)
Potassium: 6.2 mmol/L (ref 3.5–5.2)
Sodium: 135 mmol/L (ref 134–144)

## 2019-03-03 LAB — SPECIMEN STATUS REPORT

## 2019-03-03 NOTE — Telephone Encounter (Signed)
[facility] Visit Initial Request  Date of Request ([place]):  March 03, 2019  Requesting Provider:  Coletta Memos, NP    Agency Requested:    [facility]:  Joen Laura, RN, [place], [place], [place]  [address] [email or web address]  Phone #: [phone] Fax #: [phone]  Patient Demographic Information: Name:  Mallory Jensen Age:  86 y.o.   DOB:  1922/10/11  MRN:  [id number]   Address:   [address] [place] [address]   Phone Numbers:   Home Phone [phone]  Mobile [phone]  Home Phone [phone]     Emergency Contact Information on File:   Contact Information    Name Relation Home Work Mobile   [place] Daughter [phone]   Shutt,Cindy Daughter [phone]     Lars Pinks [phone]  [phone]      The above family members may be contacted for information on this patient (review DPR on file):  No    Patient Clinical Information:  Primary Care Provider:  Haywood Pao, MD  Primary Cardiologist:  Peter Martinique, MD  Primary Electrophysiologist:  None   Past Medical Hx: Ms. Tiller  has a past medical history of Acute pancreatitis (January 2014), Chronic back pain, Chronic fatigue, Compression fracture of vertebral column ([place]), Headache, HTN (hypertension), Hypercholesteremia, Macular hole, Mild aortic stenosis, MRSA (methicillin resistant Staphylococcus aureus), Osteoporosis, Ovarian cyst, RBBB, and Staph infection.   Allergies: She is allergic to penicillins; codeine; and tramadol hcl.   Medications: Current Outpatient Medications on File Prior to Visit  Medication Sig  . amLODipine (NORVASC) 5 MG tablet Take 1 tablet (5 mg total) by mouth daily.  Marland Kitchen aspirin EC 81 MG tablet Take 1 tablet (81 mg total) by mouth daily.  . calcium-vitamin D (OSCAL WITH D) 500-200 MG-UNIT per tablet Take 1 tablet by mouth daily.  .  carvedilol (COREG) 3.125 MG tablet Take 1 tablet (3.125 mg total) by mouth 2 (two) times daily.  . Cholecalciferol (VITAMIN D3) 2000 units TABS Take 2,000 Units by mouth daily.  . furosemide (LASIX) 20 MG tablet Take 1 tablet (20 mg total) by mouth daily as needed for edema.  Marland Kitchen GINKGO BILOBA PO Take 1 tablet by mouth daily.  . Glucosamine-Chondroitin (COSAMIN DS PO) Take 1 tablet by mouth 2 (two) times daily.  . iron polysaccharides (FERREX 150) 150 MG capsule Take 150 mg by mouth daily.  Marland Kitchen losartan (COZAAR) 100 MG tablet Take 1 tablet (100 mg total) by mouth daily.  . Multiple Vitamin (MULTIVITAMIN WITH MINERALS) TABS Take 1 tablet by mouth daily. Centrum Silver  . naproxen sodium (ANAPROX) 220 MG tablet Take 220 mg by mouth 2 (two) times daily as needed (for pain.).  Marland Kitchen pantoprazole (PROTONIX) 40 MG tablet Take 1 tablet (40 mg total) by mouth daily.  Vladimir Faster Glycol-Propyl Glycol (SYSTANE ULTRA) 0.4-0.3 % SOLN Place 1-2 drops into both eyes 4 (four) times daily as needed (for dry eyes).  . potassium chloride SA (KLOR-CON) 20 MEQ tablet Take 1 tablet (20 mEq total) by mouth daily.   No current facility-administered medications on file prior to visit.     Social Hx: She  reports that she has never smoked. She has never used smokeless tobacco. She reports current alcohol use. She reports that she does not use drugs.    Diagnosis/Reason for Visit:   AKI, HTN, RBBB, DOE, LOWER EXTREMITY EDEMA  Services Requested:  Vital Signs (  BP, Pulse, O2, Weight)  Physical Exam  Medication Reconciliation  Labs:  BMET TO BE DONE 03/05/19  # of Visits Needed/Frequency per Week: 5 VISITS

## 2019-03-03 NOTE — Telephone Encounter (Signed)
[facility] Visit Follow Up Request   Date of Request ([place]):  March 03, 2019  Requesting Provider:  Coletta Memos, NP    Agency Requested:    [facility] Contact:  Glory Buff, NP [address] [place], [place] [address] Phone #:  [phone] Fax #:  [phone]  Patient Demographic Information: Name:  Mallory Jensen Age:  86 y.o.   DOB:  May 30, 1922  MRN:  [id number]   Home visit progress note(s), lab results, telemetry strips, etc were reviewed.  Provider Recommendations: REPEAT LABS (BMET); VITAL SIGNS  Follow up home services requested:  Vital Signs (BP, Pulse, O2, Weight)  Labs:  BMET Friday- 03-05-2019

## 2019-03-04 ENCOUNTER — Telehealth: Payer: Self-pay | Admitting: Cardiology

## 2019-03-04 NOTE — Telephone Encounter (Signed)
Patient's daughter, [name], is following up in regards to [place] mix up. She states she has called [facility] to make them aware that patient is having blood drawn elsewhere. [name] states [facility] is the only agency that should be taking patient's blood. Please call to clarify.

## 2019-03-04 NOTE — Telephone Encounter (Signed)
New Message  Pt called and she has a lot of questions regarding home health and [facility]. She has questions she would like to ask  Please call to discuss

## 2019-03-04 NOTE — Telephone Encounter (Signed)
Patient calling back stating she got a call from [facility] for her lab appointment tomorrow and also got a call from [facility] that said they were also coming. She does not understand why she got a call from both and does not have a time either. She would like a call back.

## 2019-03-04 NOTE — Telephone Encounter (Signed)
Spoke to [place] at [facility].Advised Coletta Memos NP ordered a bmet to be done Monday 03/08/19.Order faxed to [phone].

## 2019-03-04 NOTE — Telephone Encounter (Signed)
Returned call to patient.She stated she has [facility].Stated she received a call from [facility] someone is coming out to draw her blood tomorrow.Stated she does not know why they called.Advised she will have to call [facility] back and let them know she already has a home health service.

## 2019-03-05 NOTE — Telephone Encounter (Signed)
Called Gwinda Passe at [facility]. Verified that [facility] is going Monday 03-08-2019 to do lab draw.

## 2019-03-08 DIAGNOSIS — I10 Essential (primary) hypertension: Secondary | ICD-10-CM | POA: Diagnosis not present

## 2019-03-15 DIAGNOSIS — Z79899 Other long term (current) drug therapy: Secondary | ICD-10-CM | POA: Diagnosis not present

## 2019-03-29 ENCOUNTER — Telehealth: Payer: Self-pay | Admitting: General Practice

## 2019-03-29 NOTE — Telephone Encounter (Signed)
Spoke to patient she  States she wanted to talk to [place]. She stated she wanted to know if she can take  Lasix dose today .  "I am retaining fliud"  Weight is  145 lbs today and yesterday, 2 days ago 143.5 lb She states she retaining in diaphragm some in legs. She would like to take medication @12  noon if possible  patient aware will defer to [facility] NP

## 2019-03-29 NOTE — Telephone Encounter (Signed)
  Pt c/o medication issue:  1. Name of Medication: furosemide (LASIX) 20 MG tablet  2. How are you currently taking this medication (dosage and times per day)? As needed  3. Are you having a reaction (difficulty breathing--STAT)? no  4. What is your medication issue? Patient states she needs permission to take the medication today. She states she would need to take it before noon. She would not give any further information.

## 2019-03-29 NOTE — Telephone Encounter (Signed)
S/w JC, FNP ok to take lasix 20mg  today. Called pt and she states her weight went down 139# today her weight is 145# she states that she will CB tomorrow if this is ineffective.

## 2019-03-31 DIAGNOSIS — J90 Pleural effusion, not elsewhere classified: Secondary | ICD-10-CM | POA: Diagnosis not present

## 2019-03-31 DIAGNOSIS — I517 Cardiomegaly: Secondary | ICD-10-CM | POA: Diagnosis not present

## 2019-04-01 NOTE — Progress Notes (Signed)
Mallory Jensen Date of Birth: 1922/03/25 Medical Record [id number]  History of Present Illness: Mallory Jensen is seen for follow up of HTN. She has a history of hypertension, hyperlipidemia, and right bundle branch block.   This past year amlodipine was added for HTN. She developed increased edema initially treated with lasix then switched to chlorthalidone. Amlodipine was reduced. Subsequent creatinine went up to 1.62 and chlorthalidone was discontinued. Creatinine improved to 1.3. She is still on prn lasix. Recent lab showed potassium of 6.2. ? Hemolysis. This is being repeated.   On follow up today she is seen with her daughter. She reports increased edema and swelling in her ankles and abdomen for several weeks. Appetite is poor and her weight is actually down from last year. She is taking lasix prn. Feels weak and SOB. She states she cannot get support hose on.   Current Outpatient Medications on File Prior to Visit  Medication Sig Dispense Refill  . amLODipine (NORVASC) 5 MG tablet Take 1 tablet (5 mg total) by mouth daily.    Marland Kitchen aspirin EC 81 MG tablet Take 1 tablet (81 mg total) by mouth daily. 90 tablet 3  . calcium-vitamin D (OSCAL WITH D) 500-200 MG-UNIT per tablet Take 1 tablet by mouth daily.    . carvedilol (COREG) 3.125 MG tablet Take 1 tablet (3.125 mg total) by mouth 2 (two) times daily. 60 tablet 3  . Cholecalciferol (VITAMIN D3) 2000 units TABS Take 2,000 Units by mouth daily.    . furosemide (LASIX) 20 MG tablet Take 1 tablet (20 mg total) by mouth daily as needed for edema. 30 tablet   . GINKGO BILOBA PO Take 1 tablet by mouth daily.    . Glucosamine-Chondroitin (COSAMIN DS PO) Take 1 tablet by mouth 2 (two) times daily.    . iron polysaccharides (FERREX 150) 150 MG capsule Take 150 mg by mouth daily.    Marland Kitchen losartan (COZAAR) 100 MG tablet Take 1 tablet (100 mg total) by mouth daily.    . Multiple Vitamin (MULTIVITAMIN WITH MINERALS) TABS Take 1 tablet by mouth daily.  Centrum Silver    . naproxen sodium (ANAPROX) 220 MG tablet Take 220 mg by mouth 2 (two) times daily as needed (for pain.).    Marland Kitchen pantoprazole (PROTONIX) 40 MG tablet Take 1 tablet (40 mg total) by mouth daily.    Vladimir Faster Glycol-Propyl Glycol (SYSTANE ULTRA) 0.4-0.3 % SOLN Place 1-2 drops into both eyes 4 (four) times daily as needed (for dry eyes).     No current facility-administered medications on file prior to visit.    Allergies  Allergen Reactions  . Penicillins Rash and Other (See Comments)    PATIENT HAS HAD A PCN REACTION WITH IMMEDIATE RASH, FACIAL/TONGUE/THROAT SWELLING, SOB, OR LIGHTHEADEDNESS WITH HYPOTENSION:  #  #  #  YES  #  #  #   Has patient had a PCN reaction causing severe rash involving mucus membranes or skin necrosis: No Has patient had a PCN reaction that required hospitalization: No Has patient had a PCN reaction occurring within the last 10 years: No If all of the above answers are "NO", then may proceed with Cephalosporin use.   . Codeine Nausea Only  . Tramadol Hcl Other (See Comments)    "Made me crazy"---caused mood changes    Past Medical History:  Diagnosis Date  . Acute pancreatitis January 2014  . Chronic back pain   . Chronic fatigue   . Compression fracture of vertebral  column ([place])   . Headache    PMH  . HTN (hypertension)   . Hypercholesteremia   . Macular hole   . Mild aortic stenosis   . MRSA (methicillin resistant Staphylococcus aureus)   . Osteoporosis   . Ovarian cyst   . RBBB   . Staph infection     Past Surgical History:  Procedure Laterality Date  . ABDOMINAL HYSTERECTOMY    . APPENDECTOMY    . athroscopy Right shoulder    . Biopsy Left Breast x 2    . CESAREAN SECTION    . CHOLECYSTECTOMY    . PARS PLANA VITRECTOMY Left 10/09/2016   Procedure: PARS PLANA VITRECTOMY WITH 25 GAUGE;  Surgeon: Bernarda Caffey, MD;  Location: [place];  Service: Ophthalmology;  Laterality: Left;  . PARS PLANA VITRECTOMY W/ REPAIR OF MACULAR  HOLE Right 2015    Social History   Tobacco Use  Smoking Status Never Smoker  Smokeless Tobacco Never Used    Social History   Substance and Sexual Activity  Alcohol Use Yes   Comment: glass of wine infrequently    Family History  Problem Relation Age of Onset  . Cataracts Mother   . Heart disease Brother   . Heart disease Brother   . Cancer Other   . Heart disease Other   . Amblyopia Neg Hx   . Blindness Neg Hx   . Glaucoma Neg Hx   . Macular degeneration Neg Hx   . Retinal detachment Neg Hx   . Strabismus Neg Hx   . Retinitis pigmentosa Neg Hx     Review of Systems: As noted in HPI.  All other systems were reviewed and are negative.  Physical Exam: BP (!) 114/52 (BP Location: Left Arm, Patient Position: Sitting, Cuff Size: Normal)   Pulse 68   Temp (!) 95.2 F (35.1 C)   Ht 5' (1.524 m)   Wt 147 lb (66.7 kg)   BMI 28.71 kg/m  GENERAL:  Well appearing, elderly WF in NAD. Seen in a wheelchair.  HEENT:  PERRL, EOMI, sclera are clear. Oropharynx is clear. NECK:  No jugular venous distention, carotid upstroke brisk and symmetric, no bruits, no thyromegaly or adenopathy LUNGS:  Clear to auscultation bilaterally CHEST:  Unremarkable HEART:  RRR,  PMI not displaced or sustained,S1 and S2 within normal limits, no S3, no S4: no clicks, no rubs, harsh gr 2/6 systolic murmur RUSB>> LLSB ABD:  Soft, nontender. BS +, no masses or bruits. Distended. No hepatomegaly, no splenomegaly EXT:  2 + pulses throughout, 1+ edema right ankle. 2+ LLE edema., no cyanosis no clubbing SKIN:  Warm and dry.  No rashes NEURO:  Alert and oriented x 3. Cranial nerves II through XII intact. PSYCH:  Cognitively intact    LABORATORY DATA: Lab Results  Component Value Date   WBC 10.0 10/09/2016   HGB 10.1 (L) 10/09/2016   HCT 29.1 (L) 10/09/2016   PLT 287 10/09/2016   GLUCOSE 85 03/02/2019   CHOL 76 03/16/2012   TRIG 118 03/16/2012   HDL 6 (L) 03/16/2012   LDLCALC 46 03/16/2012    ALT 9 08/28/2012   AST 17 08/28/2012   NA 135 03/02/2019   K 6.2 (HH) 03/02/2019   CL 102 03/02/2019   CREATININE 1.30 (H) 03/02/2019   BUN 43 (H) 03/02/2019   CO2 21 03/02/2019   TSH 2.965 08/30/2012   INR 0.97 08/28/2012   HGBA1C 5.6 08/28/2012    Dated 2.27.18: cholesterol 216, triglycerides  358, HDL 34, LDL 110.  Dated 07/11/16: BUN 26 creatinine 1.1, other chemistries normal. Dated 04/09/17: cholesterol 182, triglycerides 488, HDL 32, LDL 64. Hgb 10.6.  Dated 10/23/17: creatinine 1.2. Dated 04/21/18: cholesterol 169, triglycerides 369, HDL 27, LDL 63 Dated 12.4.20: Hgb 9.6.    Assessment / Plan: 1. HTN- BP is under excellent control.   Will continue current therapy with amlodipine, metoprolol, losartan. Avoid chlorthalidone due to worsening renal insufficiency  2. RBBB chronic.  3. Acute on chronic kidney failure. Creatinine improved to 1.3 with stopping chlorthalidone.   4. Edema. Recommend she take lasix 20 mg every day. Avoid sodium. Elevate feet when possible. Do not take potassium. Will check protein stores, TSH and UA to see if she is spilling protein.   5. Fatigue and dyspnea. Multifactorial with anemia, edema, deconditioning and age.   6. Murmur. Most c/w AS. Last Echo 2014. Given advanced age will hold off on Echo unless condition worsens.   Follow up 3 months

## 2019-04-02 ENCOUNTER — Other Ambulatory Visit: Payer: Self-pay

## 2019-04-02 ENCOUNTER — Encounter: Payer: Self-pay | Admitting: Cardiology

## 2019-04-02 ENCOUNTER — Ambulatory Visit (INDEPENDENT_AMBULATORY_CARE_PROVIDER_SITE_OTHER): Payer: Medicare Other | Admitting: Cardiology

## 2019-04-02 VITALS — BP 114/52 | HR 68 | Temp 95.2°F | Ht 60.0 in | Wt 147.0 lb

## 2019-04-02 DIAGNOSIS — I10 Essential (primary) hypertension: Secondary | ICD-10-CM | POA: Diagnosis not present

## 2019-04-02 DIAGNOSIS — R600 Localized edema: Secondary | ICD-10-CM | POA: Diagnosis not present

## 2019-04-02 DIAGNOSIS — I4510 Unspecified right bundle-branch block: Secondary | ICD-10-CM

## 2019-04-02 DIAGNOSIS — N1831 Chronic kidney disease, stage 3a: Secondary | ICD-10-CM

## 2019-04-02 NOTE — Patient Instructions (Signed)
Take lasix 20 mg daily  Stop taking potassium  We will check some blood work and a urine specimen today  Keep feet elevated when possible.   Avoid salt

## 2019-04-03 ENCOUNTER — Emergency Department (HOSPITAL_COMMUNITY): Payer: Medicare Other

## 2019-04-03 ENCOUNTER — Encounter (HOSPITAL_COMMUNITY): Payer: Self-pay

## 2019-04-03 ENCOUNTER — Encounter (HOSPITAL_COMMUNITY): Payer: Medicare Other

## 2019-04-03 ENCOUNTER — Telehealth: Payer: Self-pay | Admitting: Cardiology

## 2019-04-03 ENCOUNTER — Inpatient Hospital Stay (HOSPITAL_COMMUNITY)
Admission: EM | Admit: 2019-04-03 | Discharge: 2019-04-10 | DRG: 682 | Disposition: A | Discharge status: Home Health | Payer: Medicare Other | Attending: Internal Medicine | Admitting: Internal Medicine

## 2019-04-03 DIAGNOSIS — Z79899 Other long term (current) drug therapy: Secondary | ICD-10-CM

## 2019-04-03 DIAGNOSIS — I4510 Unspecified right bundle-branch block: Secondary | ICD-10-CM | POA: Diagnosis present

## 2019-04-03 DIAGNOSIS — R140 Abdominal distension (gaseous): Secondary | ICD-10-CM | POA: Diagnosis not present

## 2019-04-03 DIAGNOSIS — Z885 Allergy status to narcotic agent status: Secondary | ICD-10-CM

## 2019-04-03 DIAGNOSIS — R0602 Shortness of breath: Secondary | ICD-10-CM | POA: Diagnosis not present

## 2019-04-03 DIAGNOSIS — I130 Hypertensive heart and chronic kidney disease with heart failure and stage 1 through stage 4 chronic kidney disease, or unspecified chronic kidney disease: Secondary | ICD-10-CM | POA: Diagnosis present

## 2019-04-03 DIAGNOSIS — D631 Anemia in chronic kidney disease: Secondary | ICD-10-CM | POA: Diagnosis not present

## 2019-04-03 DIAGNOSIS — L8990 Pressure ulcer of unspecified site, unspecified stage: Secondary | ICD-10-CM | POA: Insufficient documentation

## 2019-04-03 DIAGNOSIS — K5710 Diverticulosis of small intestine without perforation or abscess without bleeding: Secondary | ICD-10-CM | POA: Diagnosis not present

## 2019-04-03 DIAGNOSIS — R918 Other nonspecific abnormal finding of lung field: Secondary | ICD-10-CM | POA: Diagnosis not present

## 2019-04-03 DIAGNOSIS — D649 Anemia, unspecified: Secondary | ICD-10-CM | POA: Diagnosis present

## 2019-04-03 DIAGNOSIS — J910 Malignant pleural effusion: Secondary | ICD-10-CM | POA: Diagnosis present

## 2019-04-03 DIAGNOSIS — Z9049 Acquired absence of other specified parts of digestive tract: Secondary | ICD-10-CM

## 2019-04-03 DIAGNOSIS — E785 Hyperlipidemia, unspecified: Secondary | ICD-10-CM | POA: Diagnosis present

## 2019-04-03 DIAGNOSIS — G8929 Other chronic pain: Secondary | ICD-10-CM | POA: Diagnosis present

## 2019-04-03 DIAGNOSIS — I083 Combined rheumatic disorders of mitral, aortic and tricuspid valves: Secondary | ICD-10-CM | POA: Diagnosis present

## 2019-04-03 DIAGNOSIS — R809 Proteinuria, unspecified: Secondary | ICD-10-CM | POA: Diagnosis not present

## 2019-04-03 DIAGNOSIS — N1832 Chronic kidney disease, stage 3b: Secondary | ICD-10-CM | POA: Diagnosis present

## 2019-04-03 DIAGNOSIS — Z9889 Other specified postprocedural states: Secondary | ICD-10-CM

## 2019-04-03 DIAGNOSIS — L89151 Pressure ulcer of sacral region, stage 1: Secondary | ICD-10-CM | POA: Diagnosis present

## 2019-04-03 DIAGNOSIS — Z8249 Family history of ischemic heart disease and other diseases of the circulatory system: Secondary | ICD-10-CM

## 2019-04-03 DIAGNOSIS — C561 Malignant neoplasm of right ovary: Secondary | ICD-10-CM | POA: Diagnosis present

## 2019-04-03 DIAGNOSIS — E0781 Sick-euthyroid syndrome: Secondary | ICD-10-CM | POA: Diagnosis present

## 2019-04-03 DIAGNOSIS — R971 Elevated cancer antigen 125 [CA 125]: Secondary | ICD-10-CM | POA: Diagnosis present

## 2019-04-03 DIAGNOSIS — Z7982 Long term (current) use of aspirin: Secondary | ICD-10-CM

## 2019-04-03 DIAGNOSIS — K859 Acute pancreatitis without necrosis or infection, unspecified: Secondary | ICD-10-CM | POA: Diagnosis present

## 2019-04-03 DIAGNOSIS — M810 Age-related osteoporosis without current pathological fracture: Secondary | ICD-10-CM | POA: Diagnosis present

## 2019-04-03 DIAGNOSIS — Z880 Allergy status to penicillin: Secondary | ICD-10-CM

## 2019-04-03 DIAGNOSIS — K839 Disease of biliary tract, unspecified: Secondary | ICD-10-CM | POA: Diagnosis present

## 2019-04-03 DIAGNOSIS — I5033 Acute on chronic diastolic (congestive) heart failure: Secondary | ICD-10-CM | POA: Diagnosis present

## 2019-04-03 DIAGNOSIS — R600 Localized edema: Secondary | ICD-10-CM

## 2019-04-03 DIAGNOSIS — N189 Chronic kidney disease, unspecified: Secondary | ICD-10-CM | POA: Diagnosis not present

## 2019-04-03 DIAGNOSIS — Z20822 Contact with and (suspected) exposure to covid-19: Secondary | ICD-10-CM | POA: Diagnosis present

## 2019-04-03 DIAGNOSIS — R180 Malignant ascites: Secondary | ICD-10-CM | POA: Diagnosis present

## 2019-04-03 DIAGNOSIS — I10 Essential (primary) hypertension: Secondary | ICD-10-CM | POA: Diagnosis present

## 2019-04-03 DIAGNOSIS — D75839 Thrombocytosis, unspecified: Secondary | ICD-10-CM | POA: Diagnosis present

## 2019-04-03 DIAGNOSIS — C9000 Multiple myeloma not having achieved remission: Secondary | ICD-10-CM | POA: Diagnosis not present

## 2019-04-03 DIAGNOSIS — R188 Other ascites: Secondary | ICD-10-CM

## 2019-04-03 DIAGNOSIS — N179 Acute kidney failure, unspecified: Secondary | ICD-10-CM | POA: Diagnosis present

## 2019-04-03 DIAGNOSIS — R609 Edema, unspecified: Secondary | ICD-10-CM | POA: Diagnosis not present

## 2019-04-03 DIAGNOSIS — K7689 Other specified diseases of liver: Secondary | ICD-10-CM | POA: Diagnosis not present

## 2019-04-03 DIAGNOSIS — D473 Essential (hemorrhagic) thrombocythemia: Secondary | ICD-10-CM | POA: Diagnosis not present

## 2019-04-03 DIAGNOSIS — I5032 Chronic diastolic (congestive) heart failure: Secondary | ICD-10-CM | POA: Diagnosis present

## 2019-04-03 DIAGNOSIS — E611 Iron deficiency: Secondary | ICD-10-CM | POA: Diagnosis present

## 2019-04-03 DIAGNOSIS — N170 Acute kidney failure with tubular necrosis: Principal | ICD-10-CM | POA: Diagnosis present

## 2019-04-03 DIAGNOSIS — E875 Hyperkalemia: Secondary | ICD-10-CM | POA: Diagnosis present

## 2019-04-03 DIAGNOSIS — C801 Malignant (primary) neoplasm, unspecified: Secondary | ICD-10-CM | POA: Diagnosis not present

## 2019-04-03 DIAGNOSIS — E038 Other specified hypothyroidism: Secondary | ICD-10-CM | POA: Diagnosis present

## 2019-04-03 DIAGNOSIS — J90 Pleural effusion, not elsewhere classified: Secondary | ICD-10-CM

## 2019-04-03 DIAGNOSIS — Z90710 Acquired absence of both cervix and uterus: Secondary | ICD-10-CM

## 2019-04-03 DIAGNOSIS — I5031 Acute diastolic (congestive) heart failure: Secondary | ICD-10-CM | POA: Diagnosis not present

## 2019-04-03 DIAGNOSIS — N183 Chronic kidney disease, stage 3 unspecified: Secondary | ICD-10-CM | POA: Diagnosis not present

## 2019-04-03 DIAGNOSIS — E039 Hypothyroidism, unspecified: Secondary | ICD-10-CM | POA: Diagnosis present

## 2019-04-03 DIAGNOSIS — N2889 Other specified disorders of kidney and ureter: Secondary | ICD-10-CM | POA: Diagnosis not present

## 2019-04-03 DIAGNOSIS — J984 Other disorders of lung: Secondary | ICD-10-CM | POA: Diagnosis not present

## 2019-04-03 DIAGNOSIS — C384 Malignant neoplasm of pleura: Secondary | ICD-10-CM | POA: Diagnosis not present

## 2019-04-03 LAB — IRON AND TIBC
Iron: 29 ug/dL (ref 28–170)
Saturation Ratios: 13 % (ref 10.4–31.8)
TIBC: 218 ug/dL — ABNORMAL LOW (ref 250–450)
UIBC: 189 ug/dL

## 2019-04-03 LAB — BASIC METABOLIC PANEL
Anion gap: 13 (ref 5–15)
Anion gap: 9 (ref 5–15)
BUN: 55 mg/dL — ABNORMAL HIGH (ref 8–23)
BUN: 52 mg/dL — ABNORMAL HIGH (ref 8–23)
CO2: 21 mmol/L — ABNORMAL LOW (ref 22–32)
CO2: 22 mmol/L (ref 22–32)
Calcium: 8.6 mg/dL — ABNORMAL LOW (ref 8.9–10.3)
Calcium: 8.5 mg/dL — ABNORMAL LOW (ref 8.9–10.3)
Chloride: 105 mmol/L (ref 98–111)
Chloride: 106 mmol/L (ref 98–111)
Creatinine, Ser: 2.21 mg/dL — ABNORMAL HIGH (ref 0.44–1.00)
Creatinine, Ser: 2.07 mg/dL — ABNORMAL HIGH (ref 0.44–1.00)
GFR calc Af Amer: 21 mL/min — ABNORMAL LOW (ref >60)
GFR calc Af Amer: 23 mL/min — ABNORMAL LOW (ref >60)
GFR calc non Af Amer: 18 mL/min — ABNORMAL LOW (ref >60)
GFR calc non Af Amer: 20 mL/min — ABNORMAL LOW (ref >60)
Glucose, Bld: 120 mg/dL — ABNORMAL HIGH (ref 70–99)
Glucose, Bld: 115 mg/dL — ABNORMAL HIGH (ref 70–99)
Potassium: 5.9 mmol/L — ABNORMAL HIGH (ref 3.5–5.1)
Potassium: 5 mmol/L (ref 3.5–5.1)
Sodium: 139 mmol/L (ref 135–145)
Sodium: 137 mmol/L (ref 135–145)

## 2019-04-03 LAB — COMPREHENSIVE METABOLIC PANEL
ALT: 10 IU/L (ref 0–32)
AST: 17 IU/L (ref 0–40)
Albumin/Globulin Ratio: 1.2 (ref 1.2–2.2)
Albumin: 3.5 g/dL (ref 3.5–4.6)
Alkaline Phosphatase: 97 IU/L (ref 39–117)
BUN/Creatinine Ratio: 26 (ref 12–28)
BUN: 52 mg/dL — ABNORMAL HIGH (ref 10–36)
Bilirubin Total: 0.3 mg/dL (ref 0.0–1.2)
CO2: 19 mmol/L — ABNORMAL LOW (ref 20–29)
Calcium: 8.6 mg/dL — ABNORMAL LOW (ref 8.7–10.3)
Chloride: 105 mmol/L (ref 96–106)
Creatinine, Ser: 1.99 mg/dL — ABNORMAL HIGH (ref 0.57–1.00)
GFR calc Af Amer: 24 mL/min/{1.73_m2} — ABNORMAL LOW (ref >59)
GFR calc non Af Amer: 21 mL/min/{1.73_m2} — ABNORMAL LOW (ref >59)
Globulin, Total: 3 g/dL (ref 1.5–4.5)
Glucose: 101 mg/dL — ABNORMAL HIGH (ref 65–99)
Potassium: 6.1 mmol/L (ref 3.5–5.2)
Sodium: 139 mmol/L (ref 134–144)
Total Protein: 6.5 g/dL (ref 6.0–8.5)

## 2019-04-03 LAB — SARS CORONAVIRUS 2 (TAT 6-24 HRS): SARS Coronavirus 2: NEGATIVE

## 2019-04-03 LAB — URINALYSIS
Bilirubin, UA: NEGATIVE
Glucose, UA: NEGATIVE
Ketones, UA: NEGATIVE
Leukocytes,UA: NEGATIVE
Nitrite, UA: NEGATIVE
RBC, UA: NEGATIVE
Specific Gravity, UA: 1.018 (ref 1.005–1.030)
Urobilinogen, Ur: 0.2 mg/dL (ref 0.2–1.0)
pH, UA: 5.5 (ref 5.0–7.5)

## 2019-04-03 LAB — FERRITIN: Ferritin: 167 ng/mL (ref 11–307)

## 2019-04-03 LAB — CBC
HCT: 29.9 % — ABNORMAL LOW (ref 36.0–46.0)
Hemoglobin: 9.3 g/dL — ABNORMAL LOW (ref 12.0–15.0)
MCH: 29.2 pg (ref 26.0–34.0)
MCHC: 31.1 g/dL (ref 30.0–36.0)
MCV: 94 fL (ref 80.0–100.0)
Platelets: 404 10*3/uL — ABNORMAL HIGH (ref 150–400)
RBC: 3.18 MIL/uL — ABNORMAL LOW (ref 3.87–5.11)
RDW: 17.2 % — ABNORMAL HIGH (ref 11.5–15.5)
WBC: 8.9 10*3/uL (ref 4.0–10.5)
nRBC: 0 % (ref 0.0–0.2)

## 2019-04-03 LAB — BRAIN NATRIURETIC PEPTIDE: B Natriuretic Peptide: 122.6 pg/mL — ABNORMAL HIGH (ref 0.0–100.0)

## 2019-04-03 LAB — PROCALCITONIN: Procalcitonin: <0.1 ng/mL

## 2019-04-03 LAB — TSH: TSH: 15.2 u[IU]/mL — ABNORMAL HIGH (ref 0.450–4.500)

## 2019-04-03 LAB — CREATININE, URINE, RANDOM: Creatinine, Urine: 65.94 mg/dL

## 2019-04-03 LAB — T4, FREE: Free T4: 0.75 ng/dL (ref 0.61–1.12)

## 2019-04-03 LAB — SODIUM, URINE, RANDOM: Sodium, Ur: 71 mmol/L

## 2019-04-03 MED ORDER — ACETAMINOPHEN 650 MG RE SUPP: 650.0000 mg | Freq: Four times a day (QID) | RECTAL | Status: DC | PRN

## 2019-04-03 MED ORDER — POLYVINYL ALCOHOL 1.4 % OP SOLN: 1.0000 [drp] | Freq: Four times a day (QID) | OPHTHALMIC | Status: DC | PRN

## 2019-04-03 MED ORDER — LEVOTHYROXINE SODIUM 25 MCG PO TABS
25.0000 ug | ORAL_TABLET | Freq: Every day | ORAL | Status: DC
  Administered 2019-04-03 – 2019-04-04 (×2): 25 ug via ORAL
  Filled 2019-04-03 (×2): qty 1

## 2019-04-03 MED ORDER — FUROSEMIDE 10 MG/ML IJ SOLN
40.0000 mg | Freq: Once | INTRAMUSCULAR | Status: AC
  Administered 2019-04-03: 40 mg via INTRAVENOUS
  Filled 2019-04-03: qty 4

## 2019-04-03 MED ORDER — PANTOPRAZOLE SODIUM 40 MG PO TBEC
40.0000 mg | DELAYED_RELEASE_TABLET | Freq: Every day | ORAL | Status: DC
  Administered 2019-04-03 – 2019-04-10 (×9): 40 mg via ORAL
  Filled 2019-04-03 (×8): qty 1

## 2019-04-03 MED ORDER — SODIUM ZIRCONIUM CYCLOSILICATE 10 G PO PACK
10.0000 g | PACK | Freq: Once | ORAL | Status: AC
  Administered 2019-04-03: 10 g via ORAL
  Filled 2019-04-03: qty 1

## 2019-04-03 MED ORDER — ONDANSETRON HCL 4 MG PO TABS: 4.0000 mg | ORAL_TABLET | Freq: Four times a day (QID) | ORAL | Status: DC | PRN

## 2019-04-03 MED ORDER — ASPIRIN EC 81 MG PO TBEC
81.0000 mg | DELAYED_RELEASE_TABLET | Freq: Every day | ORAL | Status: DC
  Administered 2019-04-03 – 2019-04-10 (×8): 81 mg via ORAL
  Filled 2019-04-03 (×8): qty 1

## 2019-04-03 MED ORDER — AMLODIPINE BESYLATE 5 MG PO TABS
5.0000 mg | ORAL_TABLET | Freq: Every day | ORAL | Status: DC
  Administered 2019-04-03 – 2019-04-06 (×4): 5 mg via ORAL
  Filled 2019-04-03 (×4): qty 1

## 2019-04-03 MED ORDER — ALBUTEROL SULFATE (2.5 MG/3ML) 0.083% IN NEBU
2.5000 mg | INHALATION_SOLUTION | Freq: Four times a day (QID) | RESPIRATORY_TRACT | Status: DC | PRN
  Administered 2019-04-04: 2.5 mg via RESPIRATORY_TRACT
  Filled 2019-04-03: qty 3

## 2019-04-03 MED ORDER — SODIUM CHLORIDE 0.9 % IV SOLN: INTRAVENOUS | Status: DC

## 2019-04-03 MED ORDER — HEPARIN SODIUM (PORCINE) 5000 UNIT/ML IJ SOLN
5000.0000 [IU] | Freq: Three times a day (TID) | INTRAMUSCULAR | Status: DC
  Administered 2019-04-03 – 2019-04-05 (×7): 5000 [IU] via SUBCUTANEOUS
  Filled 2019-04-03 (×5): qty 1

## 2019-04-03 MED ORDER — ACETAMINOPHEN 325 MG PO TABS
650.0000 mg | ORAL_TABLET | Freq: Four times a day (QID) | ORAL | Status: DC | PRN
  Administered 2019-04-06: 650 mg via ORAL
  Filled 2019-04-03: qty 2

## 2019-04-03 MED ORDER — ONDANSETRON HCL 4 MG/2ML IJ SOLN: 4.0000 mg | Freq: Four times a day (QID) | INTRAMUSCULAR | Status: DC | PRN

## 2019-04-03 MED ORDER — SODIUM CHLORIDE 0.9% FLUSH
3.0000 mL | Freq: Two times a day (BID) | INTRAVENOUS | Status: DC
  Administered 2019-04-04 – 2019-04-10 (×13): 3 mL via INTRAVENOUS

## 2019-04-03 MED ORDER — CARVEDILOL 3.125 MG PO TABS
3.1250 mg | ORAL_TABLET | Freq: Two times a day (BID) | ORAL | Status: DC
  Administered 2019-04-03 – 2019-04-06 (×7): 3.125 mg via ORAL
  Filled 2019-04-03 (×8): qty 1

## 2019-04-03 MED ORDER — POLYSACCHARIDE IRON COMPLEX 150 MG PO CAPS
150.0000 mg | ORAL_CAPSULE | Freq: Every day | ORAL | Status: DC
  Administered 2019-04-03 – 2019-04-04 (×2): 150 mg via ORAL
  Filled 2019-04-03 (×3): qty 1

## 2019-04-03 MED ORDER — HEPARIN SODIUM (PORCINE) 5000 UNIT/ML IJ SOLN
INTRAMUSCULAR | Status: AC
  Filled 2019-04-03: qty 1

## 2019-04-03 NOTE — ED Provider Notes (Signed)
[facility] EMERGENCY DEPARTMENT Provider Note   CSN: [id number] Arrival date & time: 04/03/19  0310     History Chief Complaint  Patient presents with  . Abnormal Lab    Mallory Jensen is a 86 y.o. female.  The history is provided by the patient and a relative.  Abnormal Lab Result type: chemistry   Chemistry:    Potassium:  High   BUN:  High   Creatinine:  High Patient with history of hypertension, chronic back pain, aortic stenosis presents with abnormal lab.  Patient was seen by her cardiologist yesterday and labs ordered.  She was found to be in acute renal failure as well as hyperkalemia.  Patient reports she has had very little appetite recently and does feel short of breath.  No chest pain is reported.  She does report some lower extremity edema as well as abdominal fullness.  No abdominal pain Course is stable, nothing worsens her symptoms     Past Medical History:  Diagnosis Date  . Acute pancreatitis January 2014  . Chronic back pain   . Chronic fatigue   . Compression fracture of vertebral column ([place])   . Headache    PMH  . HTN (hypertension)   . Hypercholesteremia   . Macular hole   . Mild aortic stenosis   . MRSA (methicillin resistant Staphylococcus aureus)   . Osteoporosis   . Ovarian cyst   . RBBB   . Staph infection     Patient Active Problem List   Diagnosis Date Noted  . Macular hole of left eye 10/09/2016  . Osteoporosis 03/15/2012  . Thoracic compression fracture ([place]) 03/15/2012  . Altered mental status 03/15/2012  . Pancreatitis 03/13/2012  . HTN (hypertension)   . Hypercholesteremia   . RBBB   . Mild aortic stenosis     Past Surgical History:  Procedure Laterality Date  . ABDOMINAL HYSTERECTOMY    . APPENDECTOMY    . athroscopy Right shoulder    . Biopsy Left Breast x 2    . CESAREAN SECTION    . CHOLECYSTECTOMY    . PARS PLANA VITRECTOMY Left 10/09/2016   Procedure: PARS PLANA VITRECTOMY WITH 25  GAUGE;  Surgeon: Bernarda Caffey, MD;  Location: [place] [facility];  Service: Ophthalmology;  Laterality: Left;  . PARS PLANA VITRECTOMY W/ REPAIR OF MACULAR HOLE Right 2015     OB History   No obstetric history on file.     Family History  Problem Relation Age of Onset  . Cataracts Mother   . Heart disease Brother   . Heart disease Brother   . Cancer Other   . Heart disease Other   . Amblyopia Neg Hx   . Blindness Neg Hx   . Glaucoma Neg Hx   . Macular degeneration Neg Hx   . Retinal detachment Neg Hx   . Strabismus Neg Hx   . Retinitis pigmentosa Neg Hx     Social History   Tobacco Use  . Smoking status: Never Smoker  . Smokeless tobacco: Never Used  Substance Use Topics  . Alcohol use: Yes    Comment: glass of wine infrequently  . Drug use: No    Home Medications Prior to Admission medications   Medication Sig Start Date End Date Taking? Authorizing Provider  amLODipine (NORVASC) 5 MG tablet Take 1 tablet (5 mg total) by mouth daily. 01/15/19   Deberah Pelton, NP  aspirin EC 81 MG tablet Take 1 tablet (81  mg total) by mouth daily. 08/28/16   Martinique, Peter M, MD  calcium-vitamin D (OSCAL WITH D) 500-200 MG-UNIT per tablet Take 1 tablet by mouth daily.    [provider]  carvedilol (COREG) 3.125 MG tablet Take 1 tablet (3.125 mg total) by mouth 2 (two) times daily. 02/16/19 05/17/19  Deberah Pelton, NP  Cholecalciferol (VITAMIN D3) 2000 units TABS Take 2,000 Units by mouth daily.    [provider]  furosemide (LASIX) 20 MG tablet Take 1 tablet (20 mg total) by mouth daily as needed for edema. 02/16/19   Deberah Pelton, NP  GINKGO BILOBA PO Take 1 tablet by mouth daily.    [provider]  Glucosamine-Chondroitin (COSAMIN DS PO) Take 1 tablet by mouth 2 (two) times daily.    [provider]  iron polysaccharides (FERREX 150) 150 MG capsule Take 150 mg by mouth daily.    [provider]  losartan (COZAAR) 100 MG tablet Take 1 tablet (100  mg total) by mouth daily. 09/03/12   Tisovec, Fransico Him, MD  Multiple Vitamin (MULTIVITAMIN WITH MINERALS) TABS Take 1 tablet by mouth daily. Centrum Silver    [provider]  pantoprazole (PROTONIX) 40 MG tablet Take 1 tablet (40 mg total) by mouth daily. 03/18/12   Tisovec, Fransico Him, MD  Polyethyl Glycol-Propyl Glycol (SYSTANE ULTRA) 0.4-0.3 % SOLN Place 1-2 drops into both eyes 4 (four) times daily as needed (for dry eyes).    [provider]    Allergies    Penicillins, Codeine, and Tramadol hcl  Review of Systems   Review of Systems  Constitutional: Positive for appetite change and fatigue. Negative for fever.  Respiratory: Positive for shortness of breath.   Cardiovascular: Positive for leg swelling. Negative for chest pain.  All other systems reviewed and are negative.   Physical Exam Updated Vital Signs BP (!) 155/66   Pulse 75   Temp 97.6 F (36.4 C) (Oral)   Resp (!) 21   SpO2 96%   Physical Exam CONSTITUTIONAL:Elderly, no distress HEAD: Normocephalic/atraumatic EYES: EOMI/PERRL ENMT: Mucous membranes moist NECK: supple no meningeal signs SPINE/BACK:entire spine nontender CV: [id number] noted,systolic ejection murmur noted LUNGS: faint crackles bilaterally, no acute distress ABDOMEN: soft, nontender, distended NEURO: Pt is awake/alert/appropriate, moves all extremitiesx4.  No facial droop.   EXTREMITIES: pulses normal/equal, full ROM, +LE edema noted, left >right SKIN: warm, color normal PSYCH: no abnormalities of mood noted, alert and oriented to situation  ED Results / Procedures / Treatments   Labs (all labs ordered are listed, but only abnormal results are displayed) Labs Reviewed  CBC - Abnormal; Notable for the following components:      Result Value   RBC 3.18 (*)    Hemoglobin 9.3 (*)    HCT 29.9 (*)    RDW 17.2 (*)    Platelets 404 (*)    All other components within normal limits  BASIC METABOLIC PANEL - Abnormal; Notable for the  following components:   Potassium 5.9 (*)    CO2 21 (*)    Glucose, Bld 120 (*)    BUN 55 (*)    Creatinine, Ser 2.21 (*)    Calcium 8.6 (*)    GFR calc non Af Amer 18 (*)    GFR calc Af Amer 21 (*)    All other components within normal limits  SARS CORONAVIRUS 2 (TAT 6-24 HRS)    EKG EKG Interpretation  Date/Time:  Saturday April 03 2019 03:23:27 EST Ventricular  Rate:  72 PR Interval:  234 QRS Duration: 130 QT Interval:  416 QTC Calculation: 455 R Axis:   -52 Text Interpretation: Sinus rhythm with 1st degree A-V block Left axis deviation Right bundle branch block Inferior infarct , age undetermined Anterolateral infarct , age undetermined Abnormal ECG No significant change since last tracing Confirmed by Ripley Fraise [phone]) on 04/03/2019 3:32:42 AM   Radiology DG Chest Port 1 View  Result Date: 04/03/2019 CLINICAL DATA:  Acute kidney injury EXAM: PORTABLE CHEST 1 VIEW COMPARISON:  03/17/2012 FINDINGS: Hazy opacity at the right base. Cardiomegaly and low lung volumes. No edema or pneumothorax. IMPRESSION: Increased hazy opacity at the right base at least partially from pleural fluid. The underlying obscured lung could be atelectatic or consolidated. Electronically Signed   By: Monte Fantasia M.D.   On: 04/03/2019 06:15    Procedures Procedures   Medications Ordered in ED Medications  furosemide (LASIX) injection 40 mg (has no administration in time range)  sodium zirconium cyclosilicate (LOKELMA) packet 10 g (has no administration in time range)    ED Course  I have reviewed the triage vital signs and the nursing notes.  Pertinent labs & imaging results that were available during my care of the patient were reviewed by me and considered in my medical decision making (see chart for details).    MDM Rules/Calculators/A&P                      6:05 AM Patient sent to the ER for evaluation of worsening renal failure with hyperkalemia.  Patient was seen by  cardiology yesterday and was sent in for further evaluation. She has had significant worsening of her renal function over the past month.  She also has hyperkalemia but no acute EKG changes. She appears mildly tachypneic and has crackles on exam.  Will obtain chest x-ray, patient will be admitted. 7:18 AM Discussed with Dr. Fuller Plan for admission Daughter was updated on plan via phone. patient will require treatment for hyperkalemia. Lasix and Lokelma have been ordered Final Clinical Impression(s) / ED Diagnoses Final diagnoses:  Hyperkalemia  AKI (acute kidney injury) [facility]    Rx / [place] Orders ED Discharge Orders    None       Ripley Fraise, MD 04/03/19 [phone]

## 2019-04-03 NOTE — ED Notes (Signed)
PT transferred to room 9 and is waiting for IV team to start IV. Pt's labs orders and IV meds .

## 2019-04-03 NOTE — ED Triage Notes (Signed)
Pt reports that she went to her PCP and had lab work done and told it was abnormal including AKI and high K levels, sent here for recheck.

## 2019-04-03 NOTE — Telephone Encounter (Signed)
[facility] called with critical lab - K+ 6.1 and Creatinine 1.99.  She had issues with hyperkalemia in January and K+ suppl were stopped.  Now renal function worse as well.  She has had poor PO intake according to her daughter.  I discussed results with her daughter Kirke Shaggy and instructed her to take her mom to [facility] ER for redraw of BMET and treatment of elevated K+ if it remains elevated as well as workup of worsening renal function. SHe agrees with plan.

## 2019-04-03 NOTE — H&P (Signed)
History and Physical    Mallory Jensen [id number] DOB: 1923-01-27 DOA: 04/03/2019  Referring MD/NP/PA: Ripley Fraise, MD PCP: Haywood Pao, MD  Patient coming from: Home where she lives alone and ambulates with the use of a walker and was brought in by her daughter  Chief Complaint: Abnormal lab  I have personally briefly reviewed patient's old medical records in [place]   HPI: Mallory Jensen is a 86 y.o. female with medical history significant of hypertension, hyperlipidemia, aortic stenosis, chronic fatigue, osteoporosis, and RBBB.  Patient presents after being found to have abnormal lab work.  She had went to a routine follow-up with her cardiologist yesterday afternoon.  Review of records notes labs form 2/19, significant for potassium 6.1, BUN 52, and creatinine 1.99. over the last several weeks to months she has becoming more tired and fatigued.  She complains of leg swelling that is worse in the left leg.  Patient reports taking 1 to 2 pills of her diuretic lately without any improvement in symptoms.  At baseline patient reports that she has not been drinking much fluids and has been more sedimentary since the start of the pandemic.  Other associated symptoms include that she feels like she has been urinating less despite the diuretics.  Currently only using 2 pads per night and previously required 3.  Her stools are dark, but relates that to being on iron supplementation and she has not seen any gross blood.  She had an issue with her thyroid over 50 years ago where she was on medication for short period of time, but thereafter did not require.  Review of records note that her TSH was 2.214 within normal limits back in 2014.  She is scheduled to get her second dose Covid 19 vaccine on Monday at 8 AM.  ED Course: Upon admission into the emergency department patient was noted to have a temperature of 95.2 F-97.6, respiration 14-23, and all other vital signs  maintained.  Labs significant for hemoglobin 9.3 with elevated RDW, platelets 404, potassium 5.9, BUN 55, and creatinine 2.2.  Chest x-ray showed increased in a hazy opacity of the right lower lobe at least partially to be a pleural effusion.  Patient was given IV and 10 mg of Lokelma.  TRH called to admit.  Review of Systems  Constitutional: Positive for malaise/fatigue. Negative for fever.  HENT: Positive for congestion. Negative for nosebleeds.   Eyes: Negative for double vision and photophobia.  Respiratory: Negative for shortness of breath.   Cardiovascular: Positive for leg swelling. Negative for chest pain.  Gastrointestinal: Negative for abdominal pain, diarrhea, nausea and vomiting.  Genitourinary: Negative for dysuria and hematuria.  Musculoskeletal: Negative for falls, joint pain and myalgias.  Neurological: Positive for weakness. Negative for focal weakness and loss of consciousness.  Psychiatric/Behavioral: Negative for memory loss and substance abuse.    Past Medical History:  Diagnosis Date  . Acute pancreatitis January 2014  . Chronic back pain   . Chronic fatigue   . Compression fracture of vertebral column ([place])   . Headache    PMH  . HTN (hypertension)   . Hypercholesteremia   . Macular hole   . Mild aortic stenosis   . MRSA (methicillin resistant Staphylococcus aureus)   . Osteoporosis   . Ovarian cyst   . RBBB   . Staph infection     Past Surgical History:  Procedure Laterality Date  . ABDOMINAL HYSTERECTOMY    . APPENDECTOMY    .  athroscopy Right shoulder    . Biopsy Left Breast x 2    . CESAREAN SECTION    . CHOLECYSTECTOMY    . PARS PLANA VITRECTOMY Left 10/09/2016   Procedure: PARS PLANA VITRECTOMY WITH 25 GAUGE;  Surgeon: Bernarda Caffey, MD;  Location: [facility];  Service: Ophthalmology;  Laterality: Left;  . PARS PLANA VITRECTOMY W/ REPAIR OF MACULAR HOLE Right 2015     reports that she has never smoked. She has never used smokeless tobacco. She  reports current alcohol use. She reports that she does not use drugs.  Allergies  Allergen Reactions  . Penicillins Rash and Other (See Comments)    PATIENT HAS HAD A PCN REACTION WITH IMMEDIATE RASH, FACIAL/TONGUE/THROAT SWELLING, SOB, OR LIGHTHEADEDNESS WITH HYPOTENSION:  #  #  #  YES  #  #  #   Has patient had a PCN reaction causing severe rash involving mucus membranes or skin necrosis: No Has patient had a PCN reaction that required hospitalization: No Has patient had a PCN reaction occurring within the last 10 years: No If all of the above answers are "NO", then may proceed with Cephalosporin use.   . Codeine Nausea Only  . Tramadol Hcl Other (See Comments)    "Made me crazy"---caused mood changes    Family History  Problem Relation Age of Onset  . Cataracts Mother   . Heart disease Brother   . Heart disease Brother   . Cancer Other   . Heart disease Other   . Amblyopia Neg Hx   . Blindness Neg Hx   . Glaucoma Neg Hx   . Macular degeneration Neg Hx   . Retinal detachment Neg Hx   . Strabismus Neg Hx   . Retinitis pigmentosa Neg Hx     Prior to Admission medications   Medication Sig Start Date End Date Taking? Authorizing Provider  amLODipine (NORVASC) 5 MG tablet Take 1 tablet (5 mg total) by mouth daily. 01/15/19   Deberah Pelton, NP  aspirin EC 81 MG tablet Take 1 tablet (81 mg total) by mouth daily. 08/28/16   Martinique, Peter M, MD  calcium-vitamin D (OSCAL WITH D) 500-200 MG-UNIT per tablet Take 1 tablet by mouth daily.    [provider]  carvedilol (COREG) 3.125 MG tablet Take 1 tablet (3.125 mg total) by mouth 2 (two) times daily. 02/16/19 05/17/19  Deberah Pelton, NP  Cholecalciferol (VITAMIN D3) 2000 units TABS Take 2,000 Units by mouth daily.    [provider]  furosemide (LASIX) 20 MG tablet Take 1 tablet (20 mg total) by mouth daily as needed for edema. 02/16/19   Deberah Pelton, NP  GINKGO BILOBA PO Take 1 tablet by mouth daily.    [provider]  Glucosamine-Chondroitin (COSAMIN DS PO) Take 1 tablet by mouth 2 (two) times daily.    [provider]  iron polysaccharides (FERREX 150) 150 MG capsule Take 150 mg by mouth daily.    [provider]  losartan (COZAAR) 100 MG tablet Take 1 tablet (100 mg total) by mouth daily. 09/03/12   Tisovec, Fransico Him, MD  Multiple Vitamin (MULTIVITAMIN WITH MINERALS) TABS Take 1 tablet by mouth daily. Centrum Silver    [provider]  pantoprazole (PROTONIX) 40 MG tablet Take 1 tablet (40 mg total) by mouth daily. 03/18/12   Tisovec, Fransico Him, MD  Polyethyl Glycol-Propyl Glycol (SYSTANE ULTRA) 0.4-0.3 % SOLN Place 1-2 drops into both eyes 4 (four) times daily as  needed (for dry eyes).    [provider]    Physical Exam:  Constitutional: Elderly female who appears to be in no acute distress at this time Vitals:   04/03/19 0615 04/03/19 0630 04/03/19 0645 04/03/19 0700  BP: 138/63 114/61 133/64 110/62  Pulse: 76 73 73 72  Resp: (!) 21 18 (!) 23 19  Temp:      TempSrc:      SpO2: 94% 97% 96% 92%   Eyes: PERRL, lids and conjunctivae normal ENMT: Mucous membranes are dry. Posterior pharynx clear of any exudate or lesions.  Neck: normal, supple, no masses, no thyromegaly Respiratory: clear to auscultation bilaterally, no wheezing, no crackles. Normal respiratory effort. No accessory muscle use.  Cardiovascular: Regular rate and rhythm, positive systolic murmur present.  2+ pitting lower extremity edema. 2+ pedal pulses. No carotid bruits.  Abdomen: no tenderness, no masses palpated. No hepatosplenomegaly. Bowel sounds positive.  Musculoskeletal: no clubbing / cyanosis. No joint deformity upper and lower extremities. Good ROM, no contractures. Normal muscle tone.  Skin: no rashes, lesions, ulcers. No induration Neurologic: CN 2-12 grossly intact. Sensation intact, DTR normal. Strength 5/5 in all 4.  Psychiatric: Normal judgment and insight. Alert and  oriented x 3. Normal mood.     Labs on Admission: I have personally reviewed following labs and imaging studies  CBC: Recent Labs  Lab 04/03/19 0333  WBC 8.9  HGB 9.3*  HCT 29.9*  MCV 94.0  PLT [id number]*   Basic Metabolic Panel: Recent Labs  Lab 04/02/19 1611 04/03/19 0333  NA 139 139  K 6.1* 5.9*  CL 105 105  CO2 19* 21*  GLUCOSE 101* 120*  BUN 52* 55*  CREATININE 1.99* 2.21*  CALCIUM 8.6* 8.6*   GFR: Estimated Creatinine Clearance: 13.3 mL/min (A) (by C-G formula based on SCr of 2.21 mg/dL (H)). Liver Function Tests: Recent Labs  Lab 04/02/19 1611  AST 17  ALT 10  ALKPHOS 97  BILITOT 0.3  PROT 6.5  ALBUMIN 3.5   No results for input(s): LIPASE, AMYLASE in the last 168 hours. No results for input(s): AMMONIA in the last 168 hours. Coagulation Profile: No results for input(s): INR, PROTIME in the last 168 hours. Cardiac Enzymes: No results for input(s): CKTOTAL, CKMB, CKMBINDEX, TROPONINI in the last 168 hours. BNP (last 3 results) No results for input(s): PROBNP in the last 8760 hours. HbA1C: No results for input(s): HGBA1C in the last 72 hours. CBG: No results for input(s): GLUCAP in the last 168 hours. Lipid Profile: No results for input(s): CHOL, HDL, LDLCALC, TRIG, CHOLHDL, LDLDIRECT in the last 72 hours. Thyroid Function Tests: Recent Labs    04/02/19 1611  TSH 15.200*   Anemia Panel: No results for input(s): VITAMINB12, FOLATE, FERRITIN, TIBC, IRON, RETICCTPCT in the last 72 hours. Urine analysis:    Component Value Date/Time   COLORURINE YELLOW 08/28/2012 2009   APPEARANCEUR Clear 04/02/2019 1611   LABSPEC 1.011 08/28/2012 2009   PHURINE 7.5 08/28/2012 2009   GLUCOSEU Negative 04/02/2019 1611   HGBUR LARGE (A) 08/28/2012 2009   BILIRUBINUR Negative 04/02/2019 1611   KETONESUR NEGATIVE 08/28/2012 2009   PROTEINUR 1+ (A) 04/02/2019 1611   PROTEINUR >300 (A) 08/28/2012 2009   UROBILINOGEN 0.2 08/28/2012 2009   NITRITE Negative 04/02/2019  1611   NITRITE NEGATIVE 08/28/2012 2009   LEUKOCYTESUR Negative 04/02/2019 1611   Sepsis Labs: No results found for this or any previous visit (from the past 240 hour(s)).   Radiological Exams on Admission:  DG Chest Port 1 View  Result Date: 04/03/2019 CLINICAL DATA:  Acute kidney injury EXAM: PORTABLE CHEST 1 VIEW COMPARISON:  03/17/2012 FINDINGS: Hazy opacity at the right base. Cardiomegaly and low lung volumes. No edema or pneumothorax. IMPRESSION: Increased hazy opacity at the right base at least partially from pleural fluid. The underlying obscured lung could be atelectatic or consolidated. Electronically Signed   By: Monte Fantasia M.D.   On: 04/03/2019 06:15    EKG: Independently reviewed.  Sinus rhythm with first-degree AV block, left axis deviation and RBBB similar to previous tracings  Assessment/Plan Acute kidney injury superimposed on chronic kidney disease stage IIIb: Patient's baseline creatinine previously have been around 1.3 on 1/19.  Patient presents with creatinine elevated up to 2.21 with BUN 55.  The elevated BUN to creatinine ratio(24.8) suggest a prerenal renal cause of symptoms. -Check FeUr and FeNa -Hold further nephrotoxic agent   -Gentle IV fluids at 50 mL/h  Hyperkalemia: Acute.  Potassium 5.9 on admission, but previously had been 6.1 yesterday.  Patient on medications of losartan which can increase potassium levels.  Patient was initially given 40 mg of Lasix and 10 mg of Lokelma. -Recheck potassium level  -Hold losartan  Subclinical hypothyroidism: Acute.  Patient presents with signs of peripheral edema on physical exam and reports complaints of worsening fatigue and tiredness.  TSH elevated up to 15.2 on outpatient lab work from 2/19.  She is not on any thyroid supplementation.  Given patient patient's TSH greater than 10 it would warrant initiating thyroid replacement.  Suspect this could likely be more so the cause of patient's lower extremity edema. -Check  free T4(0.75), T3 -Start levothyroxine 25 mcg daily  -Reevaluate TSH in 4-6 weeks depending on clinical findings  Diastolic congestive heart failure: Patient with at least 2+ pitting edema of the bilateral lower extremities.  Symptoms had not improved with oral Lasix and patient's kidney function appeared to be declining.  Last echocardiogram noted EF of 60-65% with grade 1 diastolic dysfunction in 123456.  Patient was initially given 40 mg of Lasix IV. -Strict I&Os -Daily weights -Check BNP -Check Echocardiogram  Lower extremity edema: Exact cause not clearly known at this time. -Elevate leg  -Check Doppler ultrasound of lower extremities  Essential hypertension: Home medications include amlodipine 5 mg daily, Coreg 3.125 mg twice daily, furosemide 20 g daily as needed for edema, and losartan 100 mg daily. -Hold losartan and p.o. furosemide -Continue Coreg and amlodipine  Normocytic anemia: Hemoglobin 9.3 on admission, but was 10.1 yesterday. Patient appears to be on iron supplementation at home RDW elevated concerning for possibility of iron deficiency. -Check iron studies -Recheck CBC in a.m.   Thrombocytosis: Acute.  Platelet count elevated up to 404. -Recheck platelet count in a.m.  DVT prophylaxis: heparin  Code Status: Full  Family Communication: Daughter updated over the phone Disposition Plan: To be determined Consults called: None Admission status: Inpatient  Norval Morton MD [facility] Pager [phone]   If 7PM-7AM, please contact night-coverage [email or web address] Password [facility]  04/03/2019, 7:26 AM

## 2019-04-03 NOTE — ED Notes (Signed)
Eating Breakfast

## 2019-04-04 ENCOUNTER — Inpatient Hospital Stay (HOSPITAL_COMMUNITY): Payer: Medicare Other

## 2019-04-04 DIAGNOSIS — L8990 Pressure ulcer of unspecified site, unspecified stage: Secondary | ICD-10-CM | POA: Insufficient documentation

## 2019-04-04 DIAGNOSIS — I5032 Chronic diastolic (congestive) heart failure: Secondary | ICD-10-CM

## 2019-04-04 DIAGNOSIS — E0781 Sick-euthyroid syndrome: Secondary | ICD-10-CM

## 2019-04-04 DIAGNOSIS — I5031 Acute diastolic (congestive) heart failure: Secondary | ICD-10-CM

## 2019-04-04 DIAGNOSIS — R609 Edema, unspecified: Secondary | ICD-10-CM

## 2019-04-04 LAB — BASIC METABOLIC PANEL
Anion gap: 8 (ref 5–15)
BUN: 50 mg/dL — ABNORMAL HIGH (ref 8–23)
CO2: 21 mmol/L — ABNORMAL LOW (ref 22–32)
Calcium: 8.1 mg/dL — ABNORMAL LOW (ref 8.9–10.3)
Chloride: 108 mmol/L (ref 98–111)
Creatinine, Ser: 2.01 mg/dL — ABNORMAL HIGH (ref 0.44–1.00)
GFR calc Af Amer: 24 mL/min — ABNORMAL LOW (ref >60)
GFR calc non Af Amer: 21 mL/min — ABNORMAL LOW (ref >60)
Glucose, Bld: 90 mg/dL (ref 70–99)
Potassium: 4.9 mmol/L (ref 3.5–5.1)
Sodium: 137 mmol/L (ref 135–145)

## 2019-04-04 LAB — CBC
HCT: 26.2 % — ABNORMAL LOW (ref 36.0–46.0)
Hemoglobin: 8.5 g/dL — ABNORMAL LOW (ref 12.0–15.0)
MCH: 29.7 pg (ref 26.0–34.0)
MCHC: 32.4 g/dL (ref 30.0–36.0)
MCV: 91.6 fL (ref 80.0–100.0)
Platelets: 332 10*3/uL (ref 150–400)
RBC: 2.86 MIL/uL — ABNORMAL LOW (ref 3.87–5.11)
RDW: 17 % — ABNORMAL HIGH (ref 11.5–15.5)
WBC: 7.5 10*3/uL (ref 4.0–10.5)
nRBC: 0 % (ref 0.0–0.2)

## 2019-04-04 LAB — ECHOCARDIOGRAM COMPLETE
Height: 60 in
Weight: 2366.86 oz

## 2019-04-04 LAB — T3, FREE: T3, Free: 2.2 pg/mL (ref 2.0–4.4)

## 2019-04-04 LAB — VITAMIN B12: Vitamin B-12: 737 pg/mL (ref 180–914)

## 2019-04-04 LAB — UREA NITROGEN, URINE: Urea Nitrogen, Ur: 403 mg/dL

## 2019-04-04 MED ORDER — DOCUSATE SODIUM 100 MG PO CAPS
100.0000 mg | ORAL_CAPSULE | Freq: Every day | ORAL | Status: DC
  Administered 2019-04-04 – 2019-04-10 (×7): 100 mg via ORAL
  Filled 2019-04-04 (×7): qty 1

## 2019-04-04 MED ORDER — BISACODYL 10 MG RE SUPP
10.0000 mg | Freq: Once | RECTAL | Status: AC
  Administered 2019-04-04: 10 mg via RECTAL
  Filled 2019-04-04: qty 1

## 2019-04-04 MED ORDER — SODIUM CHLORIDE 0.9 % IV SOLN
510.0000 mg | Freq: Once | INTRAVENOUS | Status: AC
  Administered 2019-04-04: 510 mg via INTRAVENOUS
  Filled 2019-04-04: qty 17

## 2019-04-04 MED ORDER — POLYETHYLENE GLYCOL 3350 17 G PO PACK
17.0000 g | PACK | Freq: Every day | ORAL | Status: DC
  Administered 2019-04-04 – 2019-04-10 (×7): 17 g via ORAL
  Filled 2019-04-04 (×6): qty 1

## 2019-04-04 NOTE — Progress Notes (Signed)
PROGRESS NOTE  Mallory Jensen [id number] DOB: 06/09/1922 DOA: 04/03/2019 PCP: Mallory Pao, MD  Brief History   The patient is a 86 yr old woman who was directed to present to the ED from her cardiologist's office where she had gone on 04/02/2019 for a follow up appointment. At that visit she complained off poor urinary output, swelling of her lower extremities and increased work of breathing. Lab work performed there demonstrated an increased creatinine, hyperkalemia.   The patient was admitted to a telemetry bed. She was given NS at 50 cc/hr. Diuretics were held. Echocardiogram has been performed, but has not been reported yet. Her creatinine is roughly the same at 2.01. Hyperkalemia has resolved.   Consultants  . Nephrology  Procedures  . None  Antibiotics   Anti-infectives (From admission, onward)   None    .  Subjective  The patient is resting comfortably. The patient complains of shortness of breath with even slight activity and continued swelling of her legs.  Objective   Vitals:  Vitals:   04/03/19 1947 04/04/19 0749  BP: 124/62 134/64  Pulse: 71 72  Resp: 18 16  Temp: 98.5 F (36.9 C) 97.6 F (36.4 C)  SpO2: 95% 93%   Exam:  Constitutional:  . The patient is awake, alert, and oriented x 3. No acute distress. Respiratory:  . No increased work of breathing. . No wheezes, rales, or rhonchi . No tactile fremitus Cardiovascular:  . Regular rate and rhythm . No murmurs, ectopy, or gallups. . No lateral PMI. No thrills. Abdomen:  . Abdomen is soft, non-tender, non-distended . No hernias, masses, or organomegaly . Normoactive bowel sounds.  Musculoskeletal:  . No cyanosis or clubbing . There is 1-2+ pitting edema of lower extremities bilaterally Skin:  . No rashes, lesions, ulcers . palpation of skin: no induration or nodules Neurologic:  . CN 2-12 intact . Sensation all 4 extremities intact Psychiatric:  . Mental status o Mood, affect  appropriate o Orientation to person, place, time  . judgment and insight appear intact  I have personally reviewed the following:   Today's Data  . Vitals, BMP, CBC  Imaging  . Lower extremity doppler - pending  Cardiology Data  . Echocardiogram - pending  Scheduled Meds: . amLODipine  5 mg Oral Daily  . aspirin EC  81 mg Oral Daily  . carvedilol  3.125 mg Oral BID WC  . heparin  5,000 Units Subcutaneous Q8H  . iron polysaccharides  150 mg Oral Daily  . levothyroxine  25 mcg Oral QAC breakfast  . pantoprazole  40 mg Oral Daily  . sodium chloride flush  3 mL Intravenous Q12H   Continuous Infusions: . sodium chloride 50 mL/hr at 04/04/19 [id number]    Active Problems:   HTN (hypertension)   Subclinical hypothyroidism   Acute kidney injury superimposed on chronic kidney disease (HCC)   Hyperkalemia   Chronic diastolic CHF (congestive heart failure) (HCC)   Normocytic anemia   Thrombocytosis (HCC)   Pressure injury of skin   LOS: 1 day   A & P  Acute kidney injury superimposed on chronic kidney disease stage IIIb: Patient's baseline creatinine previously have been around 1.3 on 1/19.  Patient presents with creatinine elevated up to 2.21 with BUN 55.  The elevated BUN to creatinine ratio(24.8) suggest a prerenal renal cause of symptoms. Nephrology has been consulted. I appreciate their assistance. Monitor creatinine, electrolytes, and volume status.  Hyperkalemia: Acute.  Resolved. Potassium 5.9 on admission,  but previously had been 6.1 yesterday. Dow to 4.9 this morning. Losartan held. Diuretic stopped. The patient is getting Patient on medications of losartan which can increase potassium levels.  Patient was initially given 40 mg of Lasix and 10 mg of Lokelma in the ED.  Euthyroid sick syndrome: TSH 15.2 with FT3 of 2.2 and FT4 of 0.75. Thyroid studies should be repeated in 6 weeks after the patient has recovered from the acute illness.  Diastolic congestive heart failure:  Patient with at least 2+ pitting edema of the bilateral lower extremities.  Symptoms had not improved with oral Lasix and patient's kidney function appeared to be declining.  Last echocardiogram noted EF of 60-65% with grade 1 diastolic dysfunction in 123456.  Patient was initially given 40 mg of Lasix IV. Await results of echocardiogram performed today. Monitor volume status.  Lower extremity edema: Exact cause not clearly known at this time. Doppler of the lower extremities is pending.   Essential hypertension: Home medications include amlodipine 5 mg daily, Coreg 3.125 mg twice daily, furosemide 20 g daily as needed for edema, and losartan 100 mg daily. Coreg and amlodipine have been continued. The losartan and lasix have been held.   Normocytic anemia: Hemoglobin 9.3 on admission, but was 10.1 yesterday. 8.5 today. Patient appears to be on iron supplementation at home RDW elevated concerning for possibility of iron deficiency. Fe saturation and Iron level was low. Iron will be supplemented. Will check FOBT. Monitor CBC.  Thrombocytosis: Acute.  Platelet count elevated up to 404. 332 this morning. Monitor.  DVT prophylaxis: heparin  Code Status: Full  Family Communication: Daughter updated over the phone Disposition Plan: To be determined  Zury Fazzino, DO [facility] Direct contact: see [email or web address]  7PM-7AM contact night coverage as above 04/04/2019, 12:19 PM  LOS: 1 day

## 2019-04-04 NOTE — Progress Notes (Signed)
VASCULAR LAB PRELIMINARY  PRELIMINARY  PRELIMINARY  PRELIMINARY  Bilateral lower extremity venous duplex completed.    Preliminary report:  See CV proc for preliminary results.   Teneil Shiller, RVT 04/04/2019, 1:19 PM

## 2019-04-04 NOTE — Progress Notes (Signed)
  Echocardiogram 2D Echocardiogram has been performed.  Jennette Dubin 04/04/2019, 9:02 AM

## 2019-04-04 NOTE — Consult Note (Addendum)
Reason for Consult: AKI/CKD stage 3 Referring Physician:  Benny Lennert, DO  Mallory Jensen is an 86 y.o. female.  HPI: Mallory Jensen has a PMH significant for HTN, RBBB, HLD, and CKD stage 3 who was instructed to go to [facility] ED by her Cardiologist's office due to hyperkalemia and AKI/CKD.  She has had several alterations to her antihypertensive regimen over the past month.  She was started on amlodipine and then developed lower extremity edema and started on chlorthalidone but developed AKI with Scr up to 1.62 and was told to stop this.  Repeat Scr improved to 1.3 on 03/02/19.  She was then started on lasix 20 mg on 01/26/20 and repeat labs were notable for a K of 6.1 and Scr of 2.21 which preceded her presentation.  She was admitted for management of her hyperkalemia and we were consulted to further evaluate her AKI/CKD.  The trend in Scr is seen below.  Of note, she had been on losartan prior to admission and admitted to taking NSAIDs up until recently.  She admits to having progressive DOE and fatigue since becoming less active during the pandemic.  She also noted decreased UOP prior to admission and admits to poor po intake.   She denies any N/V/D, dysuria, pyuria, hematuria, foamy/frothy urine, urgency, frequency, or retention.   Trend in Creatinine: Creatinine, Ser  Date/Time Value Ref Range Status  04/04/2019 02:23 AM 2.01 (H) 0.44 - 1.00 mg/dL Final  04/03/2019 03:06 PM 2.07 (H) 0.44 - 1.00 mg/dL Final  04/03/2019 03:33 AM 2.21 (H) 0.44 - 1.00 mg/dL Final  04/02/2019 04:11 PM 1.99 (H) 0.57 - 1.00 mg/dL Final  03/02/2019 12:00 AM 1.30 (H) 0.57 - 1.00 mg/dL Final  02/10/2019 02:20 PM 1.62 (H) 0.57 - 1.00 mg/dL Final  01/29/2019 12:25 PM 1.51 (H) 0.57 - 1.00 mg/dL Final  01/25/2019 01:10 PM 1.39 (H) 0.57 - 1.00 mg/dL Final  01/22/2019 11:50 AM 1.46 (H) 0.57 - 1.00 mg/dL Final  10/09/2016 08:03 AM 1.08 (H) 0.44 - 1.00 mg/dL Final  09/03/2012 06:38 AM 0.78 0.50 - 1.10 mg/dL Final  09/02/2012  05:28 AM 0.72 0.50 - 1.10 mg/dL Final  09/01/2012 06:00 AM 0.72 0.50 - 1.10 mg/dL Final  08/31/2012 05:35 AM 0.77 0.50 - 1.10 mg/dL Final  08/30/2012 04:25 AM 1.23 (H) 0.50 - 1.10 mg/dL Final  08/29/2012 05:00 AM 0.89 0.50 - 1.10 mg/dL Final  08/28/2012 06:03 PM 0.63 0.50 - 1.10 mg/dL Final  08/28/2012 03:37 PM 0.65 0.50 - 1.10 mg/dL Final  03/18/2012 06:30 AM 0.76 0.50 - 1.10 mg/dL Final  03/17/2012 05:00 AM 0.92 0.50 - 1.10 mg/dL Final  03/16/2012 06:15 AM 1.08 0.50 - 1.10 mg/dL Final  03/15/2012 06:44 AM 1.47 (H) 0.50 - 1.10 mg/dL Final  03/14/2012 06:20 AM 2.33 (H) 0.50 - 1.10 mg/dL Final  03/13/2012 12:02 PM 3.70 (H) 0.50 - 1.10 mg/dL Final  03/13/2012 10:56 AM 3.18 (H) 0.50 - 1.10 mg/dL Final  08/17/2011 01:57 PM 1.00 0.50 - 1.10 mg/dL Final    PMH:   Past Medical History:  Diagnosis Date  . Acute pancreatitis January 2014  . Chronic back pain   . Chronic fatigue   . Compression fracture of vertebral column ([place])   . Headache    PMH  . HTN (hypertension)   . Hypercholesteremia   . Macular hole   . Mild aortic stenosis   . MRSA (methicillin resistant Staphylococcus aureus)   . Osteoporosis   . Ovarian cyst   . RBBB   .  Staph infection     PSH:   Past Surgical History:  Procedure Laterality Date  . ABDOMINAL HYSTERECTOMY    . APPENDECTOMY    . athroscopy Right shoulder    . Biopsy Left Breast x 2    . CESAREAN SECTION    . CHOLECYSTECTOMY    . PARS PLANA VITRECTOMY Left 10/09/2016   Procedure: PARS PLANA VITRECTOMY WITH 25 GAUGE;  Surgeon: Bernarda Caffey, MD;  Location: [place];  Service: Ophthalmology;  Laterality: Left;  . PARS PLANA VITRECTOMY W/ REPAIR OF MACULAR HOLE Right 2015    Allergies:  Allergies  Allergen Reactions  . Penicillins Rash and Other (See Comments)    PATIENT HAS HAD A PCN REACTION WITH IMMEDIATE RASH, FACIAL/TONGUE/THROAT SWELLING, SOB, OR LIGHTHEADEDNESS WITH HYPOTENSION:  #  #  #  YES  #  #  #   Has patient had a PCN reaction causing  severe rash involving mucus membranes or skin necrosis: No Has patient had a PCN reaction that required hospitalization: No Has patient had a PCN reaction occurring within the last 10 years: No If all of the above answers are "NO", then may proceed with Cephalosporin use.   . Codeine Nausea Only  . Tramadol Hcl Other (See Comments)    "Made me crazy"---caused mood changes    Medications:   Prior to Admission medications   Medication Sig Start Date End Date Taking? Authorizing Provider  albuterol (VENTOLIN HFA) 108 (90 Base) MCG/ACT inhaler Inhale 3 puffs into the lungs 2 (two) times daily.   Yes [provider]  amLODipine (NORVASC) 5 MG tablet Take 1 tablet (5 mg total) by mouth daily. 01/15/19  Yes Deberah Pelton, NP  aspirin EC 81 MG tablet Take 1 tablet (81 mg total) by mouth daily. 08/28/16  Yes Martinique, Peter M, MD  calcium-vitamin D (OSCAL WITH D) 500-200 MG-UNIT per tablet Take 1 tablet by mouth daily.   Yes [provider]  carvedilol (COREG) 3.125 MG tablet Take 1 tablet (3.125 mg total) by mouth 2 (two) times daily. 02/16/19 05/17/19 Yes Cleaver, Jossie Ng, NP  Cholecalciferol (VITAMIN D3) 2000 units TABS Take 2,000 Units by mouth daily.   Yes [provider]  docusate sodium (COLACE) 100 MG capsule Take 100 mg by mouth at bedtime.   Yes [provider]  furosemide (LASIX) 20 MG tablet Take 1 tablet (20 mg total) by mouth daily as needed for edema. Patient taking differently: Take 20 mg by mouth daily.  02/16/19  Yes Cleaver, Jossie Ng, NP  GINKGO BILOBA PO Take 1 tablet by mouth daily.   Yes [provider]  Glucosamine-Chondroitin (COSAMIN DS PO) Take 1 tablet by mouth daily.    Yes [provider]  iron polysaccharides (FERREX 150) 150 MG capsule Take 150 mg by mouth daily.   Yes [provider]  losartan (COZAAR) 100 MG tablet Take 1 tablet (100 mg total) by mouth daily. 09/03/12  Yes Tisovec, Fransico Him, MD  magnesium 30 MG  tablet Take 30 mg by mouth daily.   Yes [provider]  Multiple Vitamin (MULTIVITAMIN WITH MINERALS) TABS Take 1 tablet by mouth daily. Centrum Silver   Yes [provider]  pantoprazole (PROTONIX) 40 MG tablet Take 1 tablet (40 mg total) by mouth daily. 03/18/12  Yes Tisovec, Fransico Him, MD  Polyethyl Glycol-Propyl Glycol (SYSTANE ULTRA) 0.4-0.3 % SOLN Place 1-2 drops into both eyes 4 (four) times daily as needed (for dry eyes).   Yes  [provider]    Inpatient medications: . amLODipine  5 mg Oral Daily  . aspirin EC  81 mg Oral Daily  . carvedilol  3.125 mg Oral BID WC  . docusate sodium  100 mg Oral Daily  . heparin  5,000 Units Subcutaneous Q8H  . pantoprazole  40 mg Oral Daily  . polyethylene glycol  17 g Oral Daily  . sodium chloride flush  3 mL Intravenous Q12H    Discontinued Meds:   Medications Discontinued During This Encounter  Medication Reason  . naproxen sodium (ANAPROX) 220 MG tablet   . levothyroxine (SYNTHROID) tablet 25 mcg   . iron polysaccharides (NIFEREX) capsule 150 mg     Social History:  reports that she has never smoked. She has never used smokeless tobacco. She reports current alcohol use. She reports that she does not use drugs.  Family History:   Family History  Problem Relation Age of Onset  . Cataracts Mother   . Heart disease Brother   . Heart disease Brother   . Cancer Other   . Heart disease Other   . Amblyopia Neg Hx   . Blindness Neg Hx   . Glaucoma Neg Hx   . Macular degeneration Neg Hx   . Retinal detachment Neg Hx   . Strabismus Neg Hx   . Retinitis pigmentosa Neg Hx     Pertinent items are noted in HPI. Weight change:   Intake/Output Summary (Last 24 hours) at 04/04/2019 1353 Last data filed at 04/04/2019 0445 Gross per 24 hour  Intake 889.02 ml  Output 500 ml  Net 389.02 ml   BP 134/64 (BP Location: Right Arm)   Pulse 72   Temp 97.6 F (36.4 C) (Oral)   Resp 16   Ht 5' (1.524 m)   Wt 67.1 kg    SpO2 93%   BMI 28.89 kg/m  Vitals:   04/03/19 1611 04/03/19 1726 04/03/19 1947 04/04/19 0749  BP: (!) 121/59 134/61 124/62 134/64  Pulse: 70 70 71 72  Resp:  18 18 16   Temp: 97.8 F (36.6 C) 98 F (36.7 C) 98.5 F (36.9 C) 97.6 F (36.4 C)  TempSrc: Oral Oral Oral Oral  SpO2: 95% 100% 95% 93%  Weight:      Height:         General appearance: alert, cooperative and appears stated age Head: Normocephalic, without obvious abnormality, atraumatic, alopecia Eyes: negative findings: lids and lashes normal, conjunctivae and sclerae normal and corneas clear Neck: no adenopathy, no carotid bruit, no JVD, supple, symmetrical, trachea midline and thyroid not enlarged, symmetric, no tenderness/mass/nodules Resp: clear to auscultation bilaterally Cardio: regular rate and rhythm, II/VI SEM @ RUSB, no rub GI: soft, non-tender; bowel sounds normal; no masses,  no organomegaly Extremities: edema 1+ edema of left leg, trace edema of right  Labs: Basic Metabolic Panel: Recent Labs  Lab 04/02/19 1611 04/03/19 0333 04/03/19 1506 04/04/19 0223  NA 139 139 137 137  K 6.1* 5.9* 5.0 4.9  CL 105 105 106 108  CO2 19* 21* 22 21*  GLUCOSE 101* 120* 115* 90  BUN 52* 55* 52* 50*  CREATININE 1.99* 2.21* 2.07* 2.01*  ALBUMIN 3.5  --   --   --   CALCIUM 8.6* 8.6* 8.5* 8.1*   Liver Function Tests: Recent Labs  Lab 04/02/19 1611  AST 17  ALT 10  ALKPHOS 97  BILITOT 0.3  PROT 6.5  ALBUMIN 3.5   No results for input(s): LIPASE, AMYLASE  in the last 168 hours. No results for input(s): AMMONIA in the last 168 hours. CBC: Recent Labs  Lab 04/03/19 0333 04/04/19 0223  WBC 8.9 7.5  HGB 9.3* 8.5*  HCT 29.9* 26.2*  MCV 94.0 91.6  PLT 404* 332   PT/INR: @LABRCNTIP (inr:5) Cardiac Enzymes: )No results for input(s): CKTOTAL, CKMB, CKMBINDEX, TROPONINI in the last 168 hours. CBG: No results for input(s): GLUCAP in the last 168 hours.  Iron Studies:  Recent Labs  Lab 04/03/19 0805  IRON  29  TIBC 218*  FERRITIN 167    Xrays/Other Studies: DG Chest Port 1 View  Result Date: 04/03/2019 CLINICAL DATA:  Acute kidney injury EXAM: PORTABLE CHEST 1 VIEW COMPARISON:  03/17/2012 FINDINGS: Hazy opacity at the right base. Cardiomegaly and low lung volumes. No edema or pneumothorax. IMPRESSION: Increased hazy opacity at the right base at least partially from pleural fluid. The underlying obscured lung could be atelectatic or consolidated. Electronically Signed   By: Monte Fantasia M.D.   On: 04/03/2019 06:15   ECHOCARDIOGRAM COMPLETE  Result Date: 04/04/2019    ECHOCARDIOGRAM REPORT   Patient Name:   Mallory Jensen Date of Exam: 04/04/2019 Medical Rec #:  [id number]           Height:       60.0 in Accession #:    [id number]          Weight:       147.9 lb Date of Birth:  11/20/1922            BSA:          1.642 m Patient Age:    79 years            BP:           134/64 mmHg Patient Gender: F                   HR:           68 bpm. Exam Location:  Inpatient Procedure: 2D Echo Indications:    CHF-Acute Diastolic [id number]  History:        Patient has prior history of Echocardiogram examinations, most                 recent 08/30/2012. Risk Factors:Hypertension.  Sonographer:    Mikki Santee RDCS (AE) Referring Phys: [id number] RONDELL A SMITH IMPRESSIONS  1. Left ventricular ejection fraction, by estimation, is 70 to 75%. The left ventricle has hyperdynamic function. The left ventricle has no regional wall motion abnormalities. There is mild left ventricular hypertrophy. Left ventricular diastolic parameters are consistent with Grade I diastolic dysfunction (impaired relaxation).  2. Right ventricular systolic function is normal. The right ventricular size is normal. There is moderately elevated pulmonary artery systolic pressure. The estimated right ventricular systolic pressure is [id number] mmHg.  3. Left atrial size was mildly dilated.  4. The mitral valve is degenerative. Mild mitral valve  regurgitation.  5. The aortic valve is tricuspid. Aortic valve regurgitation is not visualized. Moderate aortic valve stenosis. Aortic valve mean gradient measures 20.5 mmHg. Aortic valve Vmax measures 3.24 m/s.  6. The inferior vena cava is normal in size with greater than 50% respiratory variability, suggesting right atrial pressure of 3 mmHg. FINDINGS  Left Ventricle: Left ventricular ejection fraction, by estimation, is 70 to 75%. The left ventricle has hyperdynamic function. The left ventricle has no regional wall motion abnormalities. The left ventricular internal cavity size was normal in size. There is  mild left ventricular hypertrophy. Left ventricular diastolic parameters are consistent with Grade I diastolic dysfunction (impaired relaxation). Right Ventricle: The right ventricular size is normal. No increase in right ventricular wall thickness. Right ventricular systolic function is normal. There is moderately elevated pulmonary artery systolic pressure. The tricuspid regurgitant velocity is 3.09 m/s, and with an assumed right atrial pressure of 3 mmHg, the estimated right ventricular systolic pressure is [id number] mmHg. Left Atrium: Left atrial size was mildly dilated. Right Atrium: Right atrial size was normal in size. Pericardium: There is no evidence of pericardial effusion. Presence of pericardial fat pad. Mitral Valve: The mitral valve is degenerative in appearance. There is mild thickening of the mitral valve leaflet(s). Moderate mitral annular calcification. Mild mitral valve regurgitation. Tricuspid Valve: The tricuspid valve is grossly normal. Tricuspid valve regurgitation is mild. Aortic Valve: The aortic valve is tricuspid. Aortic valve regurgitation is not visualized. Moderate aortic stenosis is present. Mild aortic valve annular calcification. Aortic valve mean gradient measures 20.5 mmHg. Aortic valve peak gradient measures 42.0 mmHg. Aortic valve area, by VTI measures 0.63 cm. Pulmonic Valve:  The pulmonic valve was grossly normal. Pulmonic valve regurgitation is not visualized. Aorta: The aortic root is normal in size and structure. Venous: The inferior vena cava is normal in size with greater than 50% respiratory variability, suggesting right atrial pressure of 3 mmHg. IAS/Shunts: No atrial level shunt detected by color flow Doppler.  LEFT VENTRICLE PLAX 2D LVIDd:         3.80 cm  Diastology LVIDs:         2.10 cm  LV e' lateral:   5.98 cm/s LV PW:         1.10 cm  LV E/e' lateral: 17.7 LV IVS:        1.20 cm  LV e' medial:    7.18 cm/s LVOT diam:     1.60 cm  LV E/e' medial:  14.8 LV SV:         43.63 ml LV SV Index:   26.57 LVOT Area:     2.01 cm  RIGHT VENTRICLE RV S prime:     12.70 cm/s TAPSE (M-mode): 1.8 cm LEFT ATRIUM           Index       RIGHT ATRIUM           Index LA diam:      3.60 cm 2.19 cm/m  RA Area:     11.30 cm LA Vol (A2C): 36.7 ml 22.35 ml/m RA Volume:   25.80 ml  15.71 ml/m LA Vol (A4C): 77.6 ml 47.26 ml/m  AORTIC VALVE AV Area (Vmax):    0.65 cm AV Area (Vmean):   0.64 cm AV Area (VTI):     0.63 cm AV Vmax:           324.00 cm/s AV Vmean:          213.500 cm/s AV VTI:            0.694 m AV Peak Grad:      42.0 mmHg AV Mean Grad:      20.5 mmHg LVOT Vmax:         104.00 cm/s LVOT Vmean:        67.500 cm/s LVOT VTI:          0.217 m LVOT/AV VTI ratio: 0.31  AORTA Ao Root diam: 3.10 cm MITRAL VALVE  TRICUSPID VALVE MV Area (PHT): 2.56 cm     TR Peak grad:   38.2 mmHg MV Decel Time: 296 msec     TR Vmax:        309.00 cm/s MV E velocity: 106.00 cm/s MV A velocity: 165.00 cm/s  SHUNTS MV E/A ratio:  0.64         Systemic VTI:  0.22 m                             Systemic Diam: 1.60 cm Rozann Lesches MD Electronically signed by Rozann Lesches MD Signature Date/Time: 04/04/2019/11:34:53 AM    Final    VAS [place] LOWER EXTREMITY VENOUS (DVT)  Result Date: 04/04/2019  Lower Venous DVTStudy Indications: Edema.  Comparison Study: No prior study on file for comparison  Performing Technologist: Sharion Dove RVS  Examination Guidelines: A complete evaluation includes B-mode imaging, spectral Doppler, color Doppler, and power Doppler as needed of all accessible portions of each vessel. Bilateral testing is considered an integral part of a complete examination. Limited examinations for reoccurring indications may be performed as noted. The reflux portion of the exam is performed with the patient in reverse Trendelenburg.  +---------+---------------+---------+-----------+----------+-------------------+ RIGHT    CompressibilityPhasicitySpontaneityPropertiesThrombus Aging      +---------+---------------+---------+-----------+----------+-------------------+ CFV      Full           Yes      Yes                                      +---------+---------------+---------+-----------+----------+-------------------+ SFJ      Full                                                             +---------+---------------+---------+-----------+----------+-------------------+ FV Prox  Full                                                             +---------+---------------+---------+-----------+----------+-------------------+ FV Mid   Full                                                             +---------+---------------+---------+-----------+----------+-------------------+ FV Distal               Yes      Yes                  patent by color and                                                       Doppler             +---------+---------------+---------+-----------+----------+-------------------+ PFV  Full                                                             +---------+---------------+---------+-----------+----------+-------------------+ POP      Full           Yes      Yes                                      +---------+---------------+---------+-----------+----------+-------------------+ PTV      Full                                                              +---------+---------------+---------+-----------+----------+-------------------+ PERO     Full                                                             +---------+---------------+---------+-----------+----------+-------------------+   +---------+---------------+---------+-----------+----------+--------------+ LEFT     CompressibilityPhasicitySpontaneityPropertiesThrombus Aging +---------+---------------+---------+-----------+----------+--------------+ CFV      Full           Yes      Yes                                 +---------+---------------+---------+-----------+----------+--------------+ SFJ      Full                                                        +---------+---------------+---------+-----------+----------+--------------+ FV Prox  Full                                                        +---------+---------------+---------+-----------+----------+--------------+ FV Mid   Full                                                        +---------+---------------+---------+-----------+----------+--------------+ FV DistalFull                                                        +---------+---------------+---------+-----------+----------+--------------+ PFV      Full                                                        +---------+---------------+---------+-----------+----------+--------------+  POP      Full           Yes      Yes                                 +---------+---------------+---------+-----------+----------+--------------+ PTV      Full                                                        +---------+---------------+---------+-----------+----------+--------------+ PERO     Full                                                        +---------+---------------+---------+-----------+----------+--------------+     Summary: BILATERAL: - No evidence of deep vein  thrombosis seen in the lower extremities, bilaterally.   *See table(s) above for measurements and observations.    Preliminary      Assessment/Plan: 1.  AKI/CKD stage 3- after starting lasix for peripheral edema.  UA with some protein but no blood or leukocytes.  Story is consistent with cardiorenal syndrome and is supported by ECHO which showed diastolic dysfunction.  She may also have had some ischemic ATN since her BP was low at her office visit with Cardiology on day of lab draw.  Thankfully her Scr is improving slightly with holding ARB and diuretic.  NSAID use may have played a role as well 1. Cont to hold lasix and losartan 2. Continue IVF's for another 24 hours 3. Check renal US to evaluate kidneys 4. Continue to follow UOP and SCr   2. CKD stage 3- presumably due to hypertensive nephrosclerosis.  But will check SPEP/UPEP to evaluate proteinuria. 3. Hyperkalemia- resolved 4. HTN- stable for now 5. Edema- has proteinuria as above and will quantify with 24 hour collection.  Likely related to amlodipine but also with diastolic dysfunction. 6. Diastolic CHF- as above.  Continue to hold diuretics for now. 7. Anemia- Hgb has been low for several years but has continued to drop which may also explain her DOE and fatigue.  Will need further workup with SPEP/UPEP, iron stores, and guaiac stools.  Transfuse for now and may require ESA as well.    Governor Rooks Evea Sheek 04/04/2019, 1:53 PM

## 2019-04-04 NOTE — Plan of Care (Signed)

## 2019-04-05 ENCOUNTER — Inpatient Hospital Stay (HOSPITAL_COMMUNITY): Payer: Medicare Other

## 2019-04-05 ENCOUNTER — Other Ambulatory Visit: Payer: Self-pay

## 2019-04-05 HISTORY — PX: IR PARACENTESIS: IMG2679

## 2019-04-05 LAB — CBC
HCT: 26.9 % — ABNORMAL LOW (ref 36.0–46.0)
Hemoglobin: 8.6 g/dL — ABNORMAL LOW (ref 12.0–15.0)
MCH: 29.5 pg (ref 26.0–34.0)
MCHC: 32 g/dL (ref 30.0–36.0)
MCV: 92.1 fL (ref 80.0–100.0)
Platelets: 307 10*3/uL (ref 150–400)
RBC: 2.92 MIL/uL — ABNORMAL LOW (ref 3.87–5.11)
RDW: 17 % — ABNORMAL HIGH (ref 11.5–15.5)
WBC: 7.3 10*3/uL (ref 4.0–10.5)
nRBC: 0 % (ref 0.0–0.2)

## 2019-04-05 LAB — BODY FLUID CELL COUNT WITH DIFFERENTIAL
Eos, Fluid: 0 %
Lymphs, Fluid: 25 %
Monocyte-Macrophage-Serous Fluid: 72 % (ref 50–90)
Neutrophil Count, Fluid: 3 % (ref 0–25)
Total Nucleated Cell Count, Fluid: 153 cu mm (ref 0–1000)

## 2019-04-05 LAB — KAPPA/LAMBDA LIGHT CHAINS
Kappa free light chain: 99.4 mg/L — ABNORMAL HIGH (ref 3.3–19.4)
Kappa, lambda light chain ratio: 1.46 (ref 0.26–1.65)
Lambda free light chains: 68 mg/L — ABNORMAL HIGH (ref 5.7–26.3)

## 2019-04-05 LAB — RENAL FUNCTION PANEL
Albumin: 2.4 g/dL — ABNORMAL LOW (ref 3.5–5.0)
Anion gap: 9 (ref 5–15)
BUN: 47 mg/dL — ABNORMAL HIGH (ref 8–23)
CO2: 21 mmol/L — ABNORMAL LOW (ref 22–32)
Calcium: 8.1 mg/dL — ABNORMAL LOW (ref 8.9–10.3)
Chloride: 109 mmol/L (ref 98–111)
Creatinine, Ser: 1.85 mg/dL — ABNORMAL HIGH (ref 0.44–1.00)
GFR calc Af Amer: 27 mL/min — ABNORMAL LOW (ref >60)
GFR calc non Af Amer: 23 mL/min — ABNORMAL LOW (ref >60)
Glucose, Bld: 102 mg/dL — ABNORMAL HIGH (ref 70–99)
Phosphorus: 3.7 mg/dL (ref 2.5–4.6)
Potassium: 5.2 mmol/L — ABNORMAL HIGH (ref 3.5–5.1)
Sodium: 139 mmol/L (ref 135–145)

## 2019-04-05 LAB — GRAM STAIN

## 2019-04-05 LAB — PROTEIN ELECTROPHORESIS, SERUM
A/G Ratio: 1 (ref 0.7–1.7)
Albumin ELP: 2.8 g/dL — ABNORMAL LOW (ref 2.9–4.4)
Alpha-1-Globulin: 0.2 g/dL (ref 0.0–0.4)
Alpha-2-Globulin: 0.7 g/dL (ref 0.4–1.0)
Beta Globulin: 0.7 g/dL (ref 0.7–1.3)
Gamma Globulin: 1.3 g/dL (ref 0.4–1.8)
Globulin, Total: 2.9 g/dL (ref 2.2–3.9)
Total Protein ELP: 5.7 g/dL — ABNORMAL LOW (ref 6.0–8.5)

## 2019-04-05 LAB — ALBUMIN, PLEURAL OR PERITONEAL FLUID: Albumin, Fluid: 2.3 g/dL

## 2019-04-05 LAB — GLUCOSE, PLEURAL OR PERITONEAL FLUID: Glucose, Fluid: 100 mg/dL

## 2019-04-05 LAB — PARATHYROID HORMONE, INTACT (NO CA): PTH: 54 pg/mL (ref 15–65)

## 2019-04-05 MED ORDER — ALBUMIN HUMAN 25 % IV SOLN
50.0000 g | Freq: Once | INTRAVENOUS | Status: DC
  Filled 2019-04-05: qty 50

## 2019-04-05 MED ORDER — LIDOCAINE HCL 1 % IJ SOLN
INTRAMUSCULAR | Status: DC | PRN
  Administered 2019-04-05: 10 mL

## 2019-04-05 MED ORDER — ALBUMIN HUMAN 25 % IV SOLN
50.0000 g | Freq: Once | INTRAVENOUS | Status: AC
  Administered 2019-04-05: 50 g via INTRAVENOUS
  Filled 2019-04-05: qty 150

## 2019-04-05 MED ORDER — FUROSEMIDE 10 MG/ML IJ SOLN
40.0000 mg | Freq: Once | INTRAMUSCULAR | Status: AC
  Administered 2019-04-05: 40 mg via INTRAVENOUS
  Filled 2019-04-05: qty 4

## 2019-04-05 MED ORDER — LIDOCAINE HCL 1 % IJ SOLN
INTRAMUSCULAR | Status: AC
  Filled 2019-04-05: qty 20

## 2019-04-05 NOTE — Progress Notes (Signed)
[facility] Progress Note  Name: Mallory Jensen MRN: [id number] DOB: 12/23/1922  Chief Complaint:  Instructed to go to ER for labs - hyperkalemia and AKI   Subjective:  strict ins/outs not available - has a purewick and incont x 1 overnight per nurse.  Nurse reports 140 mL bladder scan residual.  Had 150 mL and 2 other voids charted on 2/21.  Renal US without hydro but noted ascites and right pleural effusion.  Pt feels as though abd swelling worse over several weeks.  Review of systems:  No appetite - used to be a [employer] bre[employer] and not used to the food Some shortness of breath  abd feels distended.  Normally no issues urinating  ----- Background on consult:  Mallory Jensen has a PMH significant for HTN, RBBB, HLD, and CKD stage 3 who was instructed to go to [facility] ED by her Cardiologist's office due to hyperkalemia and AKI/CKD.  She has had several alterations to her antihypertensive regimen over the past month.  She was started on amlodipine and then developed lower extremity edema and started on chlorthalidone but developed AKI with Scr up to 1.62 and was told to stop this.  Repeat Scr improved to 1.3 on 03/02/19.  She was then started on lasix 20 mg on 01/26/20 and repeat labs were notable for a K of 6.1 and Scr of 2.21 which preceded her presentation.  She was admitted for management of her hyperkalemia and we were consulted to further evaluate her AKI/CKD.   Of note, she had been on losartan prior to admission and admitted to taking NSAIDs up until recently.  She admits to having progressive DOE and fatigue since becoming less active during the pandemic.  She also noted decreased UOP prior to admission and admits to poor po intake.   She denies any N/V/D, dysuria, pyuria, hematuria, foamy/frothy urine, urgency, frequency, or retention.    Intake/Output Summary (Last 24 hours) at 04/05/2019 0707 Last data filed at 04/04/2019 1810 Gross per 24 hour  Intake 1393.75 ml  Output 150  ml  Net 1243.75 ml    Vitals:  Vitals:   04/04/19 1641 04/04/19 1939 04/04/19 2127 04/05/19 0500  BP: (!) 127/56 (!) 130/54    Pulse: 73 73 77   Resp: 14 18 18    Temp: 97.6 F (36.4 C) 97.8 F (36.6 C)    TempSrc: Oral Oral    SpO2: 92% (!) 88% 92%   Weight:    69.5 kg  Height:         Physical Exam:  General adult female in bed in no acute distress HEENT normocephalic atraumatic extraocular movements intact sclera anicteric Neck supple trachea midline Lungs clear but reduced to auscultation bilaterally normal work of breathing at rest  Heart S1S2 no rub Abdomen soft but distended nontender; feels tighter per pt. Extremities no pitting edema  Psych normal mood and affect Neuro - alert and oriented x3; conversant and follows commands  Medications reviewed    Labs:  BMP Latest Ref Rng & Units 04/05/2019 04/04/2019 04/03/2019  Glucose 70 - 99 mg/dL 102(H) 90 115(H)  BUN 8 - 23 mg/dL 47(H) 50(H) 52(H)  Creatinine 0.44 - 1.00 mg/dL 1.85(H) 2.01(H) 2.07(H)  BUN/Creat Ratio 12 - 28 - - -  Sodium 135 - 145 mmol/L 139 137 137  Potassium 3.5 - 5.1 mmol/L 5.2(H) 4.9 5.0  Chloride 98 - 111 mmol/L 109 108 106  CO2 22 - 32 mmol/L 21(L) 21(L) 22  Calcium 8.9 -  10.3 mg/dL 8.1(L) 8.1(L) 8.5(L)     Assessment/Plan:   1. AKI/CKD stage 3- after starting lasix for peripheral edema.  UA with some protein but no blood or leukocytes.  c/w cardiorenal syndrome/pre-renal and ischemic insults.  ECHO which showed diastolic dysfunction.  Some ischemic ATN as BP was low at her office visit with Cardiology on day of lab draw.  renal US echogenicity suggestive of CKD but no hydro.  Thankfully her Scr is improving slightly with holding ARB and diuretic and supportive care.  NSAID use may have played a role as well 1. Hold losartan 2. Discontinue IV fluids.  Lasix IV x 1 3. Check pvr and place foley if over 250 ml retained  2. CKD stage 3- presumably due to microvascular disease from HTN.  But will  check SPEP/UPEP to evaluate proteinuria - pending  3. Hyperkalemia- changed to renal diet and discussed with pt 4. HTN- stable for now 5. Edema- has proteinuria as above and will quantify with 24 hour collection.  May be related to amlodipine but also with diastolic dysfunction. 6. Diastolic CHF- as above.  stop IV fluids.  7. Normocytic Anemia- component of iron deficiency.  Hgb has been low for several years but has continued to drop which may also explain her DOE and fatigue.  Will need further workup with SPEP/UPEP (pending).  Iron 29 with 13% sat. S/p feraheme x 1 on 2/21 and prev on oral iron.  Would guaiac stools per primary team.  Transfuse as needed for now and may require ESA as well.  8. Ascites - paracentesis per primary team discretion  Mallory Desanctis, MD 04/05/2019 7:28 AM

## 2019-04-05 NOTE — Consult Note (Signed)
[facility] [place]. Pt was enrolled into [facility] services last week on 2/16 in her home with Dtrs [name] and [place] present.  Upon enrollment pt presented with increased LE edema, dyspnea on exertion and increased abd distention.  Medications were adjusted under the direction of pt's community provider with the goal of improving these clinical findings.  Will follow hospital course and plan to resume home-based palliative support when pt discharges back home.  Please call [facility] if we can be of any assistance with d/c planning.  Wynetta Fines, RN [phone]

## 2019-04-05 NOTE — Progress Notes (Signed)
PROGRESS NOTE  Mallory Jensen [id number] DOB: 1922/07/22 DOA: 04/03/2019 PCP: Haywood Pao, MD  Brief History   The patient is a 86 yr old woman who was directed to present to the ED from her cardiologist's office where she had gone on 04/02/2019 for a follow up appointment. At that visit she complained off poor urinary output, swelling of her lower extremities and increased work of breathing. Lab work performed there demonstrated an increased creatinine, hyperkalemia.   The patient was admitted to a telemetry bed. She was given NS at 50 cc/hr. Diuretics were held. Echocardiogram has been performed. It demonstrates an EF of 70-75% and is hyperdynamic, but with no regional wall motion abnormalities. There is Grade I diastolic dysfunction. RV systolic function is normal. RV size is normal. Left atrium is mildly dilated. There is moderate aortic valve stenosis. Bilateral lower extremity dopplers were performed that were negative for DVT. X-ray of the abdomen demonstrated ascites and normal bowel gas pattern.  Nephrology has been consulted and paracentesis has been ordered.  Consultants  . Nephrology  Procedures  . None  Antibiotics   Anti-infectives (From admission, onward)   None     Subjective  The patient is resting comfortably. She is complaining of shortness of breath.  Objective   Vitals:  Vitals:   04/04/19 2127 04/05/19 0800  BP:  (!) 120/58  Pulse: 77 69  Resp: 18 16  Temp:  97.7 F (36.5 C)  SpO2: 92% 92%   Exam:  Constitutional:  . The patient is awake, alert, and oriented x 3. Mild dyspnea with conversation. Respiratory:  Marland Kitchen Mild dyspnea with conversation. . No wheezes, rales, or rhonchi . No tactile fremitus Cardiovascular:  . Regular rate and rhythm . No murmurs, ectopy, or gallups. . No lateral PMI. No thrills. Abdomen:  . Abdomen is soft, non-tender, distended with fluid wave. . No hernias, masses, or organomegaly . Normoactive bowel  sounds.  Musculoskeletal:  . No cyanosis or clubbing . There is 1-2+ pitting edema of lower extremities bilaterally Skin:  . No rashes, lesions, ulcers . palpation of skin: no induration or nodules Neurologic:  . CN 2-12 intact . Sensation all 4 extremities intact Psychiatric:  . Mental status o Mood, affect appropriate o Orientation to person, place, time  . judgment and insight appear intact  I have personally reviewed the following:   Today's Data  . Vitals, BMP, CBC, vitamin B12  Imaging  . Lower extremity doppler: Negative for DVT  Cardiology Data  . Echocardiogram:  It demonstrates an EF of 70-75% and is hyperdynamic, but with no regional wall motion abnormalities. There is Grade I diastolic dysfunction. RV systolic function is normal. RV size is normal. Left atrium is mildly dilated. There is moderate aortic valve stenosis.  Scheduled Meds: . amLODipine  5 mg Oral Daily  . aspirin EC  81 mg Oral Daily  . carvedilol  3.125 mg Oral BID WC  . docusate sodium  100 mg Oral Daily  . pantoprazole  40 mg Oral Daily  . polyethylene glycol  17 g Oral Daily  . sodium chloride flush  3 mL Intravenous Q12H   Continuous Infusions: . albumin human     Active Problems:   HTN (hypertension)   Subclinical hypothyroidism   Acute kidney injury superimposed on chronic kidney disease (HCC)   Hyperkalemia   Chronic diastolic CHF (congestive heart failure) (HCC)   Normocytic anemia   Thrombocytosis (HCC)   Pressure injury of skin  LOS: 2 days   A & P  Acute kidney injury superimposed on chronic kidney disease stage IIIb: Patient's baseline creatinine previously have been around 1.3 on 1/19.  Patient presents with creatinine elevated up to 2.21 with BUN 55.  The elevated BUN to creatinine ratio(24.8) suggest a prerenal renal cause of symptoms. Nephrology has been consulted. I appreciate their assistance. Monitor creatinine, electrolytes, and volume status.  Ascites: Unknown  etiology. Paracentesis ordered with chemistry. Albumin ordered.  Hyperkalemia: Acute.  Resolved. Potassium 5.9 on admission, but previously had been 6.1 yesterday. Dow to 4.9 this morning. Losartan held. Diuretic stopped. The patient is getting Patient on medications of losartan which can increase potassium levels. Patient was initially given 40 mg of Lasix and 10 mg of Lokelma in the ED.  Euthyroid sick syndrome: TSH 15.2 with FT3 of 2.2 and FT4 of 0.75. Thyroid studies should be repeated in 6 weeks after the patient has recovered from the acute illness.  Diastolic congestive heart failure: Patient with at least 2+ pitting edema of the bilateral lower extremities.  Symptoms had not improved with oral Lasix and patient's kidney function appeared to be declining. Echocardiogram was obtained on 04/04/2019.  It demonstrates an EF of 70-75% and is hyperdynamic, but with no regional wall motion abnormalities. There is Grade I diastolic dysfunction. RV systolic function is normal. RV size is normal. Left atrium is mildly dilated. There is moderate aortic valve stenosis. Patient was initially given 40 mg of Lasix IV. Await results of echocardiogram performed today. Monitor volume status.  Lower extremity edema: Exact cause not clearly known at this time. Doppler of the lower extremities is pending.   Essential hypertension: Home medications include amlodipine 5 mg daily, Coreg 3.125 mg twice daily, furosemide 20 g daily as needed for edema, and losartan 100 mg daily. Coreg and amlodipine have been continued. The losartan and lasix have been held.   Normocytic anemia: Hemoglobin 9.3 on admission, but was 10.1 yesterday. 8.5 today. Patient appears to be on iron supplementation at home RDW elevated concerning for possibility of iron deficiency. Fe saturation and Iron level was low. Iron will be supplemented. Will check FOBT. Monitor CBC.  Thrombocytosis: Acute.  Platelet count elevated up to 404. 307 this  morning. Monitor.  I have seen and examined this patient myself. I have spent 38 minutes in her evaluation and care.  DVT prophylaxis: heparin  Code Status: Full  Family Communication: Daughter updated over the phone Disposition Plan: To be determined  Jakki Doughty, DO [facility] Direct contact: see [email or web address]  7PM-7AM contact night coverage as above 04/05/2019, 12:47 PM  LOS: 1 day

## 2019-04-05 NOTE — Procedures (Signed)
PROCEDURE SUMMARY:  Successful US guided paracentesis from right lateral abdomen.  Yielded 5.2 liters of clear yellow fluid.  No immediate complications.  Patient tolerated well.  EBL = trace  Specimen was sent for labs.  Yaniah Thiemann S Kanyia Heaslip PA-C 04/05/2019 2:11 PM

## 2019-04-06 ENCOUNTER — Inpatient Hospital Stay (HOSPITAL_COMMUNITY): Payer: Medicare Other

## 2019-04-06 DIAGNOSIS — C9000 Multiple myeloma not having achieved remission: Secondary | ICD-10-CM

## 2019-04-06 DIAGNOSIS — N2889 Other specified disorders of kidney and ureter: Secondary | ICD-10-CM

## 2019-04-06 LAB — CBC WITH DIFFERENTIAL/PLATELET
Abs Immature Granulocytes: 0.03 10*3/uL (ref 0.00–0.07)
Basophils Absolute: 0.1 10*3/uL (ref 0.0–0.1)
Basophils Relative: 1 %
Eosinophils Absolute: 0.1 10*3/uL (ref 0.0–0.5)
Eosinophils Relative: 1 %
HCT: 27 % — ABNORMAL LOW (ref 36.0–46.0)
Hemoglobin: 8.5 g/dL — ABNORMAL LOW (ref 12.0–15.0)
Immature Granulocytes: 0 %
Lymphocytes Relative: 17 %
Lymphs Abs: 1.5 10*3/uL (ref 0.7–4.0)
MCH: 29.7 pg (ref 26.0–34.0)
MCHC: 31.5 g/dL (ref 30.0–36.0)
MCV: 94.4 fL (ref 80.0–100.0)
Monocytes Absolute: 1.1 10*3/uL — ABNORMAL HIGH (ref 0.1–1.0)
Monocytes Relative: 12 %
Neutro Abs: 6.2 10*3/uL (ref 1.7–7.7)
Neutrophils Relative %: 69 %
Platelets: 280 10*3/uL (ref 150–400)
RBC: 2.86 MIL/uL — ABNORMAL LOW (ref 3.87–5.11)
RDW: 17.1 % — ABNORMAL HIGH (ref 11.5–15.5)
WBC: 9 10*3/uL (ref 4.0–10.5)
nRBC: 0 % (ref 0.0–0.2)

## 2019-04-06 LAB — COMPREHENSIVE METABOLIC PANEL
ALT: 8 U/L (ref 0–44)
AST: 16 U/L (ref 15–41)
Albumin: 2.7 g/dL — ABNORMAL LOW (ref 3.5–5.0)
Alkaline Phosphatase: 49 U/L (ref 38–126)
Anion gap: 10 (ref 5–15)
BUN: 41 mg/dL — ABNORMAL HIGH (ref 8–23)
CO2: 18 mmol/L — ABNORMAL LOW (ref 22–32)
Calcium: 7.9 mg/dL — ABNORMAL LOW (ref 8.9–10.3)
Chloride: 109 mmol/L (ref 98–111)
Creatinine, Ser: 1.83 mg/dL — ABNORMAL HIGH (ref 0.44–1.00)
GFR calc Af Amer: 27 mL/min — ABNORMAL LOW (ref >60)
GFR calc non Af Amer: 23 mL/min — ABNORMAL LOW (ref >60)
Glucose, Bld: 139 mg/dL — ABNORMAL HIGH (ref 70–99)
Potassium: 4.8 mmol/L (ref 3.5–5.1)
Sodium: 137 mmol/L (ref 135–145)
Total Bilirubin: 0.6 mg/dL (ref 0.3–1.2)
Total Protein: 4.9 g/dL — ABNORMAL LOW (ref 6.5–8.1)

## 2019-04-06 LAB — IMMUNOFIXATION, URINE

## 2019-04-06 LAB — PROTEIN, URINE, 24 HOUR
Collection Interval-UPROT: 24 hours
Protein, 24H Urine: 168 mg/d — ABNORMAL HIGH (ref 50–100)
Protein, Urine: 16 mg/dL
Urine Total Volume-UPROT: 1050 mL

## 2019-04-06 LAB — FOLATE RBC
Folate, Hemolysate: 565 ng/mL
Folate, RBC: 2077 ng/mL (ref >498)
Hematocrit: 27.2 % — ABNORMAL LOW (ref 34.0–46.6)

## 2019-04-06 MED ORDER — SODIUM BICARBONATE 650 MG PO TABS
650.0000 mg | ORAL_TABLET | Freq: Two times a day (BID) | ORAL | Status: AC
  Administered 2019-04-06 (×2): 650 mg via ORAL
  Filled 2019-04-06 (×2): qty 1

## 2019-04-06 NOTE — Progress Notes (Addendum)
PROGRESS NOTE  Mallory Jensen [id number] DOB: 06-12-1922 DOA: 04/03/2019 PCP: Haywood Pao, MD  Brief History   The patient is a 86 yr old woman who was directed to present to the ED from her cardiologist's office where she had gone on 04/02/2019 for a follow up appointment. At that visit she complained off poor urinary output, swelling of her lower extremities and increased work of breathing. Lab work performed there demonstrated an increased creatinine, hyperkalemia.   The patient was admitted to a telemetry bed. She was given NS at 50 cc/hr. Diuretics were held. Echocardiogram has been performed. It demonstrates an EF of 70-75% and is hyperdynamic, but with no regional wall motion abnormalities. There is Grade I diastolic dysfunction. RV systolic function is normal. RV size is normal. Left atrium is mildly dilated. There is moderate aortic valve stenosis. Bilateral lower extremity dopplers were performed that were negative for DVT. X-ray of the abdomen demonstrated ascites and normal bowel gas pattern.  Nephrology has been consulted and paracentesis has been ordered.  Consultants  Nephrology  Procedures  Paracentesis  Antibiotics   Anti-infectives (From admission, onward)    None      Subjective  The patient is resting comfortably. She states that she feels somewhat better afterparacentesis yesterday. No new complaints.  Objective   Vitals:  Vitals:   04/05/19 2331 04/06/19 0837  BP:  (!) 118/46  Pulse: 82 69  Resp:  18  Temp:  98.9 F (37.2 C)  SpO2: 90% 92%   Exam:  Constitutional:  The patient is awake, alert, and oriented x 3. No acute distress. Respiratory:  No increased work of breathing. No wheezes, rales, or rhonchi No tactile fremitus Cardiovascular:  Regular rate and rhythm No murmurs, ectopy, or gallups. No lateral PMI. No thrills. Abdomen:  Abdomen is soft, non-tender, non-distended. No hernias, masses, or organomegaly Normoactive  bowel sounds.  Musculoskeletal:  No cyanosis or clubbing There is 1-2+ pitting edema of lower extremities bilaterally Skin:  No rashes, lesions, ulcers palpation of skin: no induration or nodules Neurologic:  CN 2-12 intact Sensation all 4 extremities intact Psychiatric:  Mental status Mood, affect appropriate Orientation to person, place, time  judgment and insight appear intact  I have personally reviewed the following:   Today's Data  Vitals, BMP, CBC, vitamin B12  Imaging  Lower extremity doppler: Negative for DVT  Cardiology Data  Echocardiogram:  It demonstrates an EF of 70-75% and is hyperdynamic, but with no regional wall motion abnormalities. There is Grade I diastolic dysfunction. RV systolic function is normal. RV size is normal. Left atrium is mildly dilated. There is moderate aortic valve stenosis.  Scheduled Meds:  amLODipine  5 mg Oral Daily   aspirin EC  81 mg Oral Daily   carvedilol  3.125 mg Oral BID WC   docusate sodium  100 mg Oral Daily   pantoprazole  40 mg Oral Daily   polyethylene glycol  17 g Oral Daily   sodium bicarbonate  650 mg Oral BID   sodium chloride flush  3 mL Intravenous Q12H   Continuous Infusions:   Active Problems:   HTN (hypertension)   Subclinical hypothyroidism   Acute kidney injury superimposed on chronic kidney disease (HCC)   Hyperkalemia   Chronic diastolic CHF (congestive heart failure) (HCC)   Normocytic anemia   Thrombocytosis (HCC)   Pressure injury of skin   LOS: 3 days   A & P  Acute kidney injury superimposed on chronic kidney disease  stage IIIb: Patient's baseline creatinine previously have been around 1.3 on 1/19.  Patient presents with creatinine elevated up to 2.21 with BUN 55.  The elevated BUN to creatinine ratio(24.8) suggest a prerenal renal cause of symptoms. Nephrology has been consulted. I appreciate their assistance. Monitor creatinine, electrolytes, and volume status. Cretinine is improved to 1.83  today.  Abnormal light chains on SPEP. UPEP pending: Ratio of Kappa/Lambda light chains is concerning for MM. LFT's normal.  Ascites: Unknown etiology. Paracentesis ordered with chemistry. Albumin given. Chemistry and cell count appear transudative. RUQ ultrasound with doppler is pending.    Hyperkalemia: Acute.  Resolved on renal diet. Potassium 5.9 on admission, but previously had been 6.1 yesterday. Dow to 4.9 this morning. Losartan held. Diuretic stopped. The patient is getting Patient on medications of losartan which can increase potassium levels. Patient was initially given 40 mg of Lasix and 10 mg of Lokelma in the ED.   Euthyroid sick syndrome: TSH 15.2 with FT3 of 2.2 and FT4 of 0.75. Thyroid studies should be repeated in 6 weeks after the patient has recovered from the acute illness.   Diastolic congestive heart failure: Patient with at least 2+ pitting edema of the bilateral lower extremities.  Symptoms had not improved with oral Lasix and patient's kidney function appeared to be declining. Echocardiogram was obtained on 04/04/2019.  It demonstrates an EF of 70-75% and is hyperdynamic, but with no regional wall motion abnormalities. There is Grade I diastolic dysfunction. RV systolic function is normal. RV size is normal. Left atrium is mildly dilated. There is moderate aortic valve stenosis. Patient was initially given 40 mg of Lasix IV. Await results of echocardiogram performed today. Monitor volume status.   Lower extremity edema: Exact cause not clearly known at this time. Doppler of the lower extremities is negative for DVT.   Essential hypertension: Currently normotensive. Home medications include amlodipine 5 mg daily, Coreg 3.125 mg twice daily, furosemide 20 g daily as needed for edema, and losartan 100 mg daily. Coreg and amlodipine have been continued. The losartan and lasix have been held.    Normocytic anemia: Hemoglobin 9.3 on admission, but was 10.1 yesterday. 8.5 today.  Patient appears to be on iron supplementation at home RDW elevated concerning for possibility of iron deficiency. Fe saturation and Iron level was low. Iron will be supplemented. FOBT is still pending. Hemoglobin is stable at 8.5.   Thrombocytosis: Acute.  Platelet count elevated up to 404. 280 this morning. Monitor.  Stage 1 pressure injury of sacrum: Present on admission. Wound care as per nursing.  I have seen and examined this patient myself. I have spent 34 minutes in her evaluation and care.   DVT prophylaxis: heparin  Code Status: Full  Family Communication: Daughter updated over the phone Disposition Plan: To be determined  Kawon Willcutt, DO [facility] Direct contact: see [email or web address]  7PM-7AM contact night coverage as above 04/06/2019, 11:17 AM  LOS: 1 day   ADDENDUM: right upper quadrant [place] and doppler: Diffuse hepatobiliary disease throughout liver. No evidence of portal hypertension.

## 2019-04-06 NOTE — Evaluation (Signed)
Physical Therapy Evaluation Patient Details Name: Mallory Jensen MRN: [id number] DOB: 19-Aug-1922 Today's Date: 04/06/2019   History of Present Illness  Pt is 86 yo female who presented to ED from cardiologist office. She was admitted with AKI, hyperkalemia, euthyroid sick syndrome, Diastolic CHF, and ascites. Paracentesis on 04/05/19 drawing off 5.2 L of fluid.  Clinical Impression  Pt admitted with above diagnosis. Pt required min A for transfers and min guard to ambulate 100' with RW.  Her O2 sats were stable on RA at rest but decreased to 83% with walking.  Pt normally very independent and motivated to return home with intermittent family supervision - daughters live close.  Discussed recommendation for HHPT and increased support from family upon initial return home.  Pt currently with functional limitations due to the deficits listed below (see PT Problem List). Pt will benefit from skilled PT to increase their independence and safety with mobility to allow discharge to the venue listed below.       Follow Up Recommendations Home health PT;Supervision - Intermittent    Equipment Recommendations  None recommended by PT    Recommendations for Other Services       Precautions / Restrictions Precautions Precautions: Fall Precaution Comments: monitor O2      Mobility  Bed Mobility Overal bed mobility: Needs Assistance Bed Mobility: Supine to Sit     Supine to sit: Min assist     General bed mobility comments: in chair at arrival - RN reports needed some assist  Transfers Overall transfer level: Needs assistance Equipment used: Rolling walker (2 wheeled) Transfers: Sit to/from Stand Sit to Stand: Min assist         General transfer comment: from chair and BSC; pt with good awareness of safety; required assist with toileting ADLs due to IV  Ambulation/Gait Ambulation/Gait assistance: Min guard Gait Distance (Feet): 100 Feet Assistive device: Rolling walker (2  wheeled) Gait Pattern/deviations: Decreased stride length Gait velocity: decreased   General Gait Details: mild instability but no LOB; fatigued easily and reports SHOB with DOE of 2/4 (see sats below)  Stairs            Wheelchair Mobility    Modified Rankin (Stroke Patients Only)       Balance Overall balance assessment: Needs assistance Sitting-balance support: No upper extremity supported;Feet supported Sitting balance-Leahy Scale: Good     Standing balance support: Bilateral upper extremity supported;During functional activity Standing balance-Leahy Scale: Fair                               Pertinent Vitals/Pain Pain Assessment: No/denies pain    Home Living Family/patient expects to be discharged to:: Private residence Living Arrangements: Alone Available Help at Discharge: Family;Available PRN/intermittently(daughters live close by) Type of Home: House Home Access: Stairs to enter Entrance Stairs-Rails: Psychiatric nurse of Steps: 3 Home Layout: One level Home Equipment: Walker - 2 wheels;Cane - single point;Bedside commode      Prior Function Level of Independence: Needs assistance   Gait / Transfers Assistance Needed: Reports she liked to go walk at [facility] until [place].  Now just walks around in her house.  Sometimes walks up driveway if she has supervision.  Does not typically use AD (only if feels "wobbly").  ADL's / Homemaking Assistance Needed: Pt reports independent.  Reports she can fix meals and performs ADLs (daughter does supervise showers). Does not drive.  Hand Dominance        Extremity/Trunk Assessment   Upper Extremity Assessment Upper Extremity Assessment: Overall WFL for tasks assessed;Defer to OT evaluation    Lower Extremity Assessment Lower Extremity Assessment: (MMT 5/5 and ROM WFL)       Communication   Communication: HOH  Cognition Arousal/Alertness: Awake/alert Behavior During  Therapy: WFL for tasks assessed/performed Overall Cognitive Status: Within Functional Limits for tasks assessed                                 General Comments: states feels "washed out"      General Comments General comments (skin integrity, edema, etc.): Pt was on RA at arrival with sats 95%.  Ambulated on RA and sats down to 83% but recovered to 91% in 1 minute of rest on RA.  Notified RN.  Left pt on RA with sats back to 95%.    Exercises     Assessment/Plan    PT Assessment Patient needs continued PT services  PT Problem List Decreased strength;Decreased mobility;Decreased safety awareness;Decreased range of motion;Decreased activity tolerance;Cardiopulmonary status limiting activity;Decreased balance;Decreased knowledge of use of DME       PT Treatment Interventions DME instruction;Therapeutic activities;Gait training;Therapeutic exercise;Patient/family education;Stair training;Balance training;Functional mobility training    PT Goals (Current goals can be found in the Care Plan section)  Acute Rehab PT Goals Patient Stated Goal: "return home and make my cauliflower soup" PT Goal Formulation: With patient Time For Goal Achievement: 04/20/19 Potential to Achieve Goals: Good    Frequency Min 3X/week   Barriers to discharge Decreased caregiver support      Co-evaluation               AM-PAC PT "6 Clicks" Mobility  Outcome Measure Help needed turning from your back to your side while in a flat bed without using bedrails?: A Little Help needed moving from lying on your back to sitting on the side of a flat bed without using bedrails?: A Little Help needed moving to and from a bed to a chair (including a wheelchair)?: A Little Help needed standing up from a chair using your arms (e.g., wheelchair or bedside chair)?: A Little Help needed to walk in hospital room?: A Little Help needed climbing 3-5 steps with a railing? : A Little 6 Click Score: 18     End of Session Equipment Utilized During Treatment: Gait belt Activity Tolerance: Patient tolerated treatment well Patient left: in chair;with call bell/phone within reach Nurse Communication: Mobility status(O2 sats dropped with activity.) PT Visit Diagnosis: Unsteadiness on feet (R26.81);Muscle weakness (generalized) (M62.81)    Time: [id number] PT Time Calculation (min) (ACUTE ONLY): 30 min   Charges:   PT Evaluation $PT Eval Moderate Complexity: 1 Mod          Maggie Font, PT Acute Rehab Services Pager [phone] [facility] [phone] [facility] [phone]   Karlton Lemon 04/06/2019, 4:58 PM

## 2019-04-06 NOTE — Progress Notes (Signed)
[facility] Progress Note  Name: Mallory Jensen MRN: [id number] DOB: 20-May-1922  Chief Complaint:  Instructed to go to ER for labs - hyperkalemia and AKI   Subjective:  Had 1.7 liters UOP over 2/22.  She had a paracentesis on 2/22 with 5.2 liters clear yellow fluid.  She did get albumin.  24 hour urine protein was ordered.  Unable to locate repeat bladder scan on 2/22.  Feels less less pressure on abdomen today - feels a little better.  Spoke with daughter via phone on rounds in room.  Review of systems: Has an appetite but doesn't like the food Breathing is better today  abd feels less distended   ----- Background on consult:  Mallory Jensen has a PMH significant for HTN, RBBB, HLD, and CKD stage 3 who was instructed to go to [facility] ED by her Cardiologist's office due to hyperkalemia and AKI/CKD.  She has had several alterations to her antihypertensive regimen over the past month.  She was started on amlodipine and then developed lower extremity edema and started on chlorthalidone but developed AKI with Scr up to 1.62 and was told to stop this.  Repeat Scr improved to 1.3 on 03/02/19.  She was then started on lasix 20 mg on 01/26/20 and repeat labs were notable for a K of 6.1 and Scr of 2.21 which preceded her presentation.  She was admitted for management of her hyperkalemia and we were consulted to further evaluate her AKI/CKD.   Of note, she had been on losartan prior to admission and admitted to taking NSAIDs up until recently.  She admits to having progressive DOE and fatigue since becoming less active during the pandemic.  She also noted decreased UOP prior to admission and admits to poor po intake.   She denies any N/V/D, dysuria, pyuria, hematuria, foamy/frothy urine, urgency, frequency, or retention.    Intake/Output Summary (Last 24 hours) at 04/06/2019 1001 Last data filed at 04/06/2019 0600 Gross per 24 hour  Intake 544.82 ml  Output 1100 ml  Net -555.18 ml     Vitals:  Vitals:   04/05/19 1543 04/05/19 1937 04/05/19 2331 04/06/19 0837  BP: (!) 106/49 (!) 127/51  (!) 118/46  Pulse: 74 91 82 69  Resp: 16   18  Temp: 98 F (36.7 C) 98.2 F (36.8 C)  98.9 F (37.2 C)  TempSrc:  Oral    SpO2: (!) 87% 91% 90% 92%  Weight:      Height:         Physical Exam:  General adult female in bed in no acute distress HEENT normocephalic atraumatic extraocular movements intact sclera anicteric Neck supple trachea midline Lungs clear but reduced to auscultation bilaterally normal work of breathing at rest  Heart S1S2 no rub Abdomen soft and less distended nontender Extremities no pitting edema  Psych normal mood and affect Neuro - alert and oriented x3; conversant and follows commands  Medications reviewed    Labs:  BMP Latest Ref Rng & Units 04/06/2019 04/05/2019 04/04/2019  Glucose 70 - 99 mg/dL 139(H) 102(H) 90  BUN 8 - 23 mg/dL 41(H) 47(H) 50(H)  Creatinine 0.44 - 1.00 mg/dL 1.83(H) 1.85(H) 2.01(H)  BUN/Creat Ratio 12 - 28 - - -  Sodium 135 - 145 mmol/L 137 139 137  Potassium 3.5 - 5.1 mmol/L 4.8 5.2(H) 4.9  Chloride 98 - 111 mmol/L 109 109 108  CO2 22 - 32 mmol/L 18(L) 21(L) 21(L)  Calcium 8.9 - 10.3 mg/dL 7.9(L) 8.1(L)  8.1(L)     Assessment/Plan:   1. AKI/CKD stage 3- after starting lasix for peripheral edema.  UA with some protein but no blood or leukocytes.  c/w cardiorenal syndrome/pre-renal and ischemic insults.  ECHO which showed diastolic dysfunction.  Some ischemic ATN as BP was low at her office visit with Cardiology on day of lab draw.  renal US echogenicity suggestive of CKD but no hydro.  Thankfully her Scr is improving slightly with holding ARB and diuretic and supportive care.  NSAID use may have played a role as well 1. Hold losartan 2. Defer lasix today and reassess tomorrow  3. S/p large volume paracentesis on 2/22 - renal function stable for now 4. Bicarb today x 2 doses   5. Check pvr and place foley if over 250 ml  retained - reordered  2. CKD stage 3- presumably due to microvascular disease from HTN.  SPEP without M spike.  Urine immunofixation ordered. 24 hour urine is in process.  Intact PTH 54  3. Hyperkalemia- changed to renal diet and discussed with pt; improved  4. HTN- controlled   5. Edema- has proteinuria as above and will quantify with 24 hour collection.  May be related to amlodipine but also with diastolic dysfunction. 6. Diastolic CHF- as above.  off IV fluids.  And s/p paracentesis. 7. Normocytic Anemia- component of iron deficiency.  Hgb has been low for several years but has continued to drop which may also explain her DOE and fatigue. UPEP pending.  SPEP ok.  Iron 29 with 13% sat. S/p feraheme x 1 on 2/21 and prev on oral iron.  Would guaiac stools per primary team.  Transfuse as needed for now and may require ESA as well.  Folate pending  8. Ascites - s/p paracentesis on 2/23.    Mallory Desanctis, MD 04/06/2019 10:22 AM

## 2019-04-07 ENCOUNTER — Inpatient Hospital Stay (HOSPITAL_COMMUNITY): Payer: Medicare Other

## 2019-04-07 ENCOUNTER — Other Ambulatory Visit: Payer: Self-pay | Admitting: General Practice

## 2019-04-07 DIAGNOSIS — R180 Malignant ascites: Secondary | ICD-10-CM

## 2019-04-07 LAB — CBC WITH DIFFERENTIAL/PLATELET
Abs Immature Granulocytes: 0.04 10*3/uL (ref 0.00–0.07)
Basophils Absolute: 0.1 10*3/uL (ref 0.0–0.1)
Basophils Relative: 1 %
Eosinophils Absolute: 0.1 10*3/uL (ref 0.0–0.5)
Eosinophils Relative: 1 %
HCT: 27.6 % — ABNORMAL LOW (ref 36.0–46.0)
Hemoglobin: 8.8 g/dL — ABNORMAL LOW (ref 12.0–15.0)
Immature Granulocytes: 0 %
Lymphocytes Relative: 20 %
Lymphs Abs: 1.9 10*3/uL (ref 0.7–4.0)
MCH: 29.6 pg (ref 26.0–34.0)
MCHC: 31.9 g/dL (ref 30.0–36.0)
MCV: 92.9 fL (ref 80.0–100.0)
Monocytes Absolute: 1.3 10*3/uL — ABNORMAL HIGH (ref 0.1–1.0)
Monocytes Relative: 13 %
Neutro Abs: 6.5 10*3/uL (ref 1.7–7.7)
Neutrophils Relative %: 65 %
Platelets: 277 10*3/uL (ref 150–400)
RBC: 2.97 MIL/uL — ABNORMAL LOW (ref 3.87–5.11)
RDW: 17.1 % — ABNORMAL HIGH (ref 11.5–15.5)
WBC: 10 10*3/uL (ref 4.0–10.5)
nRBC: 0 % (ref 0.0–0.2)

## 2019-04-07 LAB — BASIC METABOLIC PANEL
Anion gap: 8 (ref 5–15)
BUN: 39 mg/dL — ABNORMAL HIGH (ref 8–23)
CO2: 22 mmol/L (ref 22–32)
Calcium: 8 mg/dL — ABNORMAL LOW (ref 8.9–10.3)
Chloride: 109 mmol/L (ref 98–111)
Creatinine, Ser: 1.77 mg/dL — ABNORMAL HIGH (ref 0.44–1.00)
GFR calc Af Amer: 28 mL/min — ABNORMAL LOW (ref >60)
GFR calc non Af Amer: 24 mL/min — ABNORMAL LOW (ref >60)
Glucose, Bld: 115 mg/dL — ABNORMAL HIGH (ref 70–99)
Potassium: 4.7 mmol/L (ref 3.5–5.1)
Sodium: 139 mmol/L (ref 135–145)

## 2019-04-07 LAB — CYTOLOGY - NON PAP

## 2019-04-07 MED ORDER — HEPARIN SODIUM (PORCINE) 5000 UNIT/ML IJ SOLN
5000.0000 [IU] | Freq: Three times a day (TID) | INTRAMUSCULAR | Status: DC
  Administered 2019-04-07 – 2019-04-10 (×9): 5000 [IU] via SUBCUTANEOUS
  Filled 2019-04-07 (×8): qty 1

## 2019-04-07 MED ORDER — FUROSEMIDE 10 MG/ML IJ SOLN
40.0000 mg | Freq: Once | INTRAMUSCULAR | Status: AC
  Administered 2019-04-07: 40 mg via INTRAVENOUS
  Filled 2019-04-07: qty 4

## 2019-04-07 NOTE — Evaluation (Signed)
Occupational Therapy Evaluation Patient Details Name: Mallory Jensen MRN: [id number] DOB: 02-24-1922 Today's Date: 04/07/2019    History of Present Illness Pt is 86 yo female who presented to ED from cardiologist office. She was admitted with AKI, hyperkalemia, euthyroid sick syndrome, Diastolic CHF, and ascites. Paracentesis on 04/05/19 drawing off 5.2 L of fluid.   Clinical Impression   Pt was supervised for showering, but otherwise independent in ADL and light meal prep. Her daughter assists with groceries, transportation and heavy housework. Pt present with impaired standing balance, generalized weakness and decreased activity with dyspnea with minimal exertion, unable to get an accurate reading with pulse ox. She requires up to min assist for ADL and mobility. Will follow acutely.    Follow Up Recommendations  Home health OT    Equipment Recommendations  None recommended by OT    Recommendations for Other Services       Precautions / Restrictions Precautions Precautions: Fall Precaution Comments: monitor O2 Restrictions Weight Bearing Restrictions: No      Mobility Bed Mobility Overal bed mobility: Needs Assistance Bed Mobility: Sit to Supine       Sit to supine: Mod assist   General bed mobility comments: assist for LEs back into bed  Transfers Overall transfer level: Needs assistance Equipment used: Rolling walker (2 wheeled) Transfers: Sit to/from Stand Sit to Stand: Min assist;Min guard         General transfer comment: min assist first trial, min guard assist second trial, cues to scoot out to edge of chair    Balance Overall balance assessment: Needs assistance   Sitting balance-Leahy Scale: Good       Standing balance-Leahy Scale: Fair Standing balance comment: statically at sink, poor with dynamic balance activities                           ADL either performed or assessed with clinical judgement   ADL Overall ADL's : Needs  assistance/impaired Eating/Feeding: Independent   Grooming: Wash/dry hands;Min guard;Standing   Upper Body Bathing: Set up;Sitting   Lower Body Bathing: Minimal assistance;Sit to/from stand   Upper Body Dressing : Set up;Sitting   Lower Body Dressing: Minimal assistance;Sit to/from stand   Toilet Transfer: Min guard;Ambulation;RW   Toileting- Clothing Manipulation and Hygiene: Minimal assistance;Sit to/from stand       Functional mobility during ADLs: Min guard;Rolling walker General ADL Comments: pt fatigues easily     Vision Baseline Vision/History: Wears glasses Wears Glasses: Reading only       Perception     Praxis      Pertinent Vitals/Pain Pain Assessment: No/denies pain     Hand Dominance Right   Extremity/Trunk Assessment Upper Extremity Assessment Upper Extremity Assessment: Generalized weakness;Overall [facility] for tasks assessed   Lower Extremity Assessment Lower Extremity Assessment: Defer to PT evaluation       Communication Communication Communication: HOH   Cognition Arousal/Alertness: Awake/alert Behavior During Therapy: WFL for tasks assessed/performed Overall Cognitive Status: Within Functional Limits for tasks assessed                                     General Comments       Exercises     Shoulder Instructions      Home Living Family/patient expects to be discharged to:: Private residence Living Arrangements: Alone Available Help at Discharge: Family;Available PRN/intermittently Type of Home:  House Home Access: Stairs to enter [employer] of Steps: 3 Entrance Stairs-Rails: Right;Left Home Layout: One level     Bathroom Shower/Tub: Teacher, early years/pre: [place]: Environmental consultant - 2 wheels;Cane - single point;Bedside commode          Prior Functioning/Environment Level of Independence: Needs assistance  Gait / Transfers Assistance Needed: Reports she liked to go walk  at [facility] until [place].  Now just walks around in her house.  Sometimes walks up driveway if she has supervision.  Does not typically use AD (only if feels "wobbly"). ADL's / Homemaking Assistance Needed: Pt reports independent.  Reports she can fix meals and performs ADLs (daughter does supervise showers). Does not drive.            OT Problem List: Decreased activity tolerance;Decreased strength;Impaired balance (sitting and/or standing);Cardiopulmonary status limiting activity      OT Treatment/Interventions: Self-care/ADL training;Energy conservation;DME and/or AE instruction;Patient/family education;Balance training    OT Goals(Current goals can be found in the care plan section) Acute Rehab OT Goals Patient Stated Goal: "return home and make my cauliflower soup" OT Goal Formulation: With patient Time For Goal Achievement: 04/21/19 Potential to Achieve Goals: Good ADL Goals Pt Will Perform Grooming: with supervision;standing(at least 2 activities) Pt Will Perform Lower Body Bathing: with supervision;sit to/from stand Pt Will Perform Lower Body Dressing: with supervision;sit to/from stand Pt Will Transfer to Toilet: with supervision;ambulating;regular height toilet;bedside commode Pt Will Perform Toileting - Clothing Manipulation and hygiene: with supervision Additional ADL Goal #1: Pt will perform bed mobility independently in preparation for ADL. Additional ADL Goal #2: Pt will state at least 3 energy conservation strategies for ADL as instructed.  OT Frequency: Min 2X/week   Barriers to D/C:            Co-evaluation              AM-PAC OT "6 Clicks" Daily Activity     Outcome Measure Help from another person eating meals?: None Help from another person taking care of personal grooming?: A Little Help from another person toileting, which includes using toliet, bedpan, or urinal?: A Little Help from another person bathing (including washing, rinsing, drying)?: A  Little Help from another person to put on and taking off regular upper body clothing?: None Help from another person to put on and taking off regular lower body clothing?: A Little 6 Click Score: 20   End of Session Equipment Utilized During Treatment: Gait belt;Rolling walker Nurse Communication: Other (comment)(needs purewick back in)  Activity Tolerance: Patient tolerated treatment well Patient left: in bed;with call bell/phone within reach  OT Visit Diagnosis: Unsteadiness on feet (R26.81);Other abnormalities of gait and mobility (R26.89);Muscle weakness (generalized) (M62.81);Other (comment)(decreased activity tolerance)                Time: [id number] OT Time Calculation (min): 19 min Charges:  OT General Charges $OT Visit: 1 Visit OT Evaluation $OT Eval Moderate Complexity: 1 Mod  Nestor Lewandowsky, OTR/L Acute Rehabilitation Services Pager: [phone] Office: [phone]  Malka So 04/07/2019, 1:30 PM

## 2019-04-07 NOTE — Progress Notes (Addendum)
PROGRESS NOTE  Mallory Jensen [id number] DOB: 1922/09/28 DOA: 04/03/2019 PCP: Haywood Pao, MD  Brief History    The patient is a 86 yr old woman who was directed to present to the ED from her cardiologist's office where she had gone on 04/02/2019 for a follow up appointment. At that visit she complained off poor urinary output, swelling of her lower extremities and increased work of breathing. Lab work performed there demonstrated an increased creatinine, hyperkalemia.   The patient was admitted to a telemetry bed. She was given NS at 50 cc/hr. Diuretics were held. Echocardiogram has been performed. It demonstrates an EF of 70-75% and is hyperdynamic, but with no regional wall motion abnormalities. There is Grade I diastolic dysfunction. RV systolic function is normal. RV size is normal. Left atrium is mildly dilated. There is moderate aortic valve stenosis. Bilateral lower extremity dopplers were performed that were negative for DVT. X-ray of the abdomen demonstrated ascites and normal bowel gas pattern.  Nephrology has been consulted and paracentesis has been ordered.  Consultants  . Nephrology  Procedures  . Paracentesis  Antibiotics   Anti-infectives (From admission, onward)   None     Subjective  The patient for generalized weakness, she denies any abdominal pain, reports mild dyspnea, denies any chest pain .  Objective   Vitals:  Vitals:   04/06/19 1905 04/07/19 0834  BP: (!) 120/43 (!) 129/53  Pulse: 72 67  Resp:  18  Temp: 98.3 F (36.8 C) 97.7 F (36.5 C)  SpO2: 93% (!) 89%   Exam:  Awake Alert, Oriented X 3, No new F.N deficits, Normal affect Symmetrical Chest wall movement, Good air movement bilaterally, CTAB RRR,No Gallops,Rubs or new Murmurs, No Parasternal Heave +ve B.Sounds, Abd Soft, No tenderness, No rebound - guarding or rigidity. No Cyanosis, Clubbing or edema, No new Rash or bruise     Today's Data  . Vitals, BMP, CBC, vitamin B12     Imaging  . Lower extremity doppler: Negative for DVT  Cardiology Data  . Echocardiogram:  It demonstrates an EF of 70-75% and is hyperdynamic, but with no regional wall motion abnormalities. There is Grade I diastolic dysfunction. RV systolic function is normal. RV size is normal. Left atrium is mildly dilated. There is moderate aortic valve stenosis.  Scheduled Meds: . aspirin EC  81 mg Oral Daily  . docusate sodium  100 mg Oral Daily  . pantoprazole  40 mg Oral Daily  . polyethylene glycol  17 g Oral Daily  . sodium chloride flush  3 mL Intravenous Q12H   Continuous Infusions:  Active Problems:   HTN (hypertension)   Subclinical hypothyroidism   Acute kidney injury superimposed on chronic kidney disease (HCC)   Hyperkalemia   Chronic diastolic CHF (congestive heart failure) (HCC)   Normocytic anemia   Thrombocytosis (HCC)   Pressure injury of skin   LOS: 4 days   A & P   Acute kidney injury superimposed on chronic kidney disease stage IIIb: -  Patient's baseline creatinine previously have been around 1.3 on 1/19.  Patient presents with creatinine elevated up to 2.21 with BUN 55.  The elevated BUN to creatinine ratio(24.8) suggest a prerenal renal cause of symptoms. -Nephrology consult greatly appreciated, continue to hold IV fluids, diuresis as needed as likely cardiorenal, improving with diuresis.  Malignant ascites: -Paracentesis 5.2 L on 2/32 , cytology showing malignant cells, highly suspicious for GYN origin, will obtain CT abdomen pelvis for further evaluation .  Hyperkalemia:  - Acute.  Resolved on renal diet.  As needed Lasix, Lokelma, and holding losartan .   Euthyroid sick syndrome: TSH 15.2 with FT3 of 2.2 and FT4 of 0.75. Thyroid studies should be repeated in 6 weeks after the patient has recovered from the acute illness.   Acute on chronic diastolic congestive heart failure:  -Patient with at least 2+ pitting edema of the bilateral lower extremities on  presentation. -Volume status significantly improved after diuresis, she does appear to be a euvolemic today.  Echocardiogram was obtained on 04/04/2019.  It demonstrates an EF of 70-75% and is hyperdynamic, but with no regional wall motion abnormalities. There is Grade I diastolic dysfunction. RV systolic function is normal. RV size is normal. Left atrium is mildly dilated. There is moderate aortic valve stenosis. Patient was initially given 40 mg of Lasix I. Await results of echocardiogram performed today. Monitor volume status.   Lower extremity edema: Exact cause not clearly known at this time. Doppler of the lower extremities is negative for DVT.  Resolved   Essential hypertension: Currently normotensive. Home medications include amlodipine 5 mg daily, Coreg 3.125 mg twice daily, furosemide 20 g daily as needed for edema, and losartan 100 mg daily. Coreg and amlodipine have been continued. The losartan and lasix have been held.    Normocytic anemia: Hemoglobin 9.3 on admission, but was 10.1 yesterday. 8.5 today. Patient appears to be on iron supplementation at home RDW elevated concerning for possibility of iron deficiency. Fe saturation and Iron level was low. Iron will be supplemented. FOBT is still pending. Hemoglobin is stable at 8.5.   Thrombocytosis: Acute.  Platelet count elevated up to 404.  Resolved  Stage 1 pressure injury of sacrum: Present on admission. Wound care as per nursing.    DVT prophylaxis: heparin  Code Status: Full  Family Communication:  Left her daughter [name] a voicemail Disposition Plan: To be determined, awaiting CT abdomen and pelvis to evaluate for GYN malignancy, significantly frail and weak, PT consulted.  Phillips Climes MD [facility] Direct contact: see [email or web address]  7PM-7AM contact night coverage as above .

## 2019-04-07 NOTE — TOC Initial Note (Signed)
Transition of Care [facility]) - Initial/Assessment Note    Patient Details  Name: Mallory Jensen MRN: [id number] Date of Birth: Jun 14, 1922  Transition of Care [facility]) CM/SW Contact:    Carles Collet, RN Phone Number: 04/07/2019, 4:18 PM  Clinical Narrative:              Damaris Schooner w patient's daughter Mallory Jensen at [phone]. She states that patient has cre connections, and remote heath. Remote health has a Therapist, sports and PT that come out to the house once a week. We discussed extra [facility] services. She states that she will think about if that will be too much for the patient. She requestes that we call her back tomorrow and she will let us know if she wants HH.  Left message with CM covering tomorrow.            Patient Goals and CMS Choice        Expected Discharge Plan and Services                                                Prior Living Arrangements/Services                       Activities of Daily Living Home Assistive Devices/Equipment: Gilford Rile (specify type) ADL Screening (condition at time of admission) Patient's cognitive ability adequate to safely complete daily activities?: Yes Is the patient deaf or have difficulty hearing?: Yes Does the patient have difficulty seeing, even when wearing glasses/contacts?: No Does the patient have difficulty concentrating, remembering, or making decisions?: No Patient able to express need for assistance with ADLs?: Yes Does the patient have difficulty dressing or bathing?: Yes Independently performs ADLs?: No Communication: Independent Dressing (OT): Needs assistance Is this a change from baseline?: Change from baseline, expected to last <3days Grooming: Needs assistance Is this a change from baseline?: Pre-admission baseline Feeding: Independent Bathing: Dependent Is this a change from baseline?: Pre-admission baseline Toileting: Independent In/Out Bed: Independent Walks in Home: Independent with device  (comment)(FRONT WHEEL WALKER) Does the patient have difficulty walking or climbing stairs?: Yes Weakness of Legs: Both Weakness of Arms/Hands: Both  Permission Sought/Granted                  Emotional Assessment              Admission diagnosis:  Hyperkalemia [E87.5] Hypothyroidism [E03.9] AKI (acute kidney injury) ([place]) [N17.9] Patient Active Problem List   Diagnosis Date Noted  . Pressure injury of skin 04/04/2019  . Subclinical hypothyroidism 04/03/2019  . Acute kidney injury superimposed on chronic kidney disease ([place]) 04/03/2019  . Hyperkalemia 04/03/2019  . Chronic diastolic CHF (congestive heart failure) ([place]) 04/03/2019  . Normocytic anemia 04/03/2019  . Thrombocytosis ([place]) 04/03/2019  . Macular hole of left eye 10/09/2016  . Osteoporosis 03/15/2012  . Thoracic compression fracture ([place]) 03/15/2012  . Altered mental status 03/15/2012  . Pancreatitis 03/13/2012  . HTN (hypertension)   . Hypercholesteremia   . RBBB   . Mild aortic stenosis    PCP:  Tisovec, Fransico Him, MD Pharmacy:   [id number] - [place], [place] [address] Phone: [phone] Fax: [phone]     Social Determinants of Health (SDOH) Interventions    Readmission Risk Interventions No flowsheet data found.

## 2019-04-07 NOTE — Progress Notes (Addendum)
[facility] Progress Note  Name: Mallory Jensen MRN: [id number] DOB: Apr 07, 1922  Chief Complaint:  Instructed to go to ER for labs - hyperkalemia and AKI   Subjective:  Strict ins/outs not available for 2/23.  400 mL and uncharted void.  Legs don't feel swollen but has had shortness of breath.  Patient had a CT scan abd/pelvis ordered after malignant cells noted on pathology from ascitic fluid.   Review of systems:   Has an appetite but doesn't like the food Has been Short of breath abd feels less distended   ----- Background on consult:  Mallory Jensen has a PMH significant for HTN, RBBB, HLD, and CKD stage 3 who was instructed to go to [facility] ED by her Cardiologist's office due to hyperkalemia and AKI/CKD.  She has had several alterations to her antihypertensive regimen over the past month.  She was started on amlodipine and then developed lower extremity edema and started on chlorthalidone but developed AKI with Scr up to 1.62 and was told to stop this.  Repeat Scr improved to 1.3 on 03/02/19.  She was then started on lasix 20 mg on 03/29/19 and repeat labs were notable for a K of 6.1 and Scr of 2.21 which preceded her presentation.  She was admitted for management of her hyperkalemia and we were consulted to further evaluate her AKI/CKD.   Of note, she had been on losartan prior to admission and admitted to taking NSAIDs up until recently.  She admits to having progressive DOE and fatigue since becoming less active during the pandemic.  She also noted decreased UOP prior to admission and admits to poor po intake.   She denies any N/V/D, dysuria, pyuria, hematuria, foamy/frothy urine, urgency, frequency, or retention.    Intake/Output Summary (Last 24 hours) at 04/07/2019 1426 Last data filed at 04/07/2019 0507 Gross per 24 hour  Intake --  Output 400 ml  Net -400 ml    Vitals:  Vitals:   04/06/19 1552 04/06/19 1836 04/06/19 1905 04/07/19 0834  BP: (!) 122/49 (!) 132/48  (!) 120/43 (!) 129/53  Pulse: 66 72 72 67  Resp: 19   18  Temp: (!) 97.1 F (36.2 C)  98.3 F (36.8 C) 97.7 F (36.5 C)  TempSrc:   Oral   SpO2: 90%  93% (!) 89%  Weight:      Height:         Physical Exam:   General adult female in bed in no acute distress HEENT normocephalic atraumatic extraocular movements intact sclera anicteric Neck supple trachea midline Lungs crackles bases; normal work of breathing at rest; on 2 liters oxygen nasal cannula  Heart S1S2 no rub Abdomen soft and less distended nontender Extremities no pitting edema  Psych normal mood and affect Neuro - alert and oriented x3; conversant and follows commands  Medications reviewed    Labs:  BMP Latest Ref Rng & Units 04/07/2019 04/06/2019 04/05/2019  Glucose 70 - 99 mg/dL 115(H) 139(H) 102(H)  BUN 8 - 23 mg/dL 39(H) 41(H) 47(H)  Creatinine 0.44 - 1.00 mg/dL 1.77(H) 1.83(H) 1.85(H)  BUN/Creat Ratio 12 - 28 - - -  Sodium 135 - 145 mmol/L 139 137 139  Potassium 3.5 - 5.1 mmol/L 4.7 4.8 5.2(H)  Chloride 98 - 111 mmol/L 109 109 109  CO2 22 - 32 mmol/L 22 18(L) 21(L)  Calcium 8.9 - 10.3 mg/dL 8.0(L) 7.9(L) 8.1(L)     Assessment/Plan:   1. AKI/CKD stage 3- after starting lasix for  peripheral edema.  UA with some protein but no blood or leukocytes.  c/w cardiorenal syndrome/pre-renal and ischemic insults.  ECHO which showed diastolic dysfunction.  Some ischemic ATN as BP was low at her office visit with Cardiology on day of lab draw.  renal US echogenicity suggestive of CKD but no hydro.  Thankfully her Scr is improving slightly with holding ARB and diuretic and supportive care.  NSAID use may have played a role as well 1. Hold losartan 2. Improving with supportive care 3. Check pvr and place foley if over 250 ml retained - reordered as do not see this was documented  2. CKD stage 3- presumably due to microvascular disease from HTN.  Recent Cr 1.3 - 1.6.  SPEP without M spike.  Urine immunofixation ordered without  evidence of monoclonal protein.  24 hour urine with 168 mg protein.  Intact PTH 54 3. Hyperkalemia- changed to renal diet and discussed with pt; improved. Would remain off of losartan. 4. HTN- controlled   5. Edema-  Minimal protein on 24 hour collection.  May be related to amlodipine but also with diastolic dysfunction. 6. Diastolic CHF- as above. Lasix 40 mg IV once now.  s/p paracentesis. 7. Normocytic Anemia- component of iron deficiency.  Hgb has been low for several years but has continued to drop which may also explain her DOE and fatigue. UPEP pending.  SPEP ok.  Iron 29 with 13% sat. S/p feraheme x 1 on 2/21 and prev on oral iron.  Would guaiac stools per primary team.  Transfuse as needed for now.  Hold any ESA as note work-up for malignancy. 8. Ascites - s/p paracentesis on 2/23.  Work-up for malignancy ongoing per primary team    Nephrology will sign off.  Please reconsult if needed. Lasix today as above and PRN.  She is able to follow-up for CKD with nephrology at [facility] 1 month after discharge.   Claudia Desanctis, MD 04/07/2019 2:50 PM

## 2019-04-08 ENCOUNTER — Inpatient Hospital Stay (HOSPITAL_COMMUNITY): Payer: Medicare Other

## 2019-04-08 HISTORY — PX: IR THORACENTESIS ASP PLEURAL SPACE W/IMG GUIDE: IMG5380

## 2019-04-08 LAB — BODY FLUID CELL COUNT WITH DIFFERENTIAL
Eos, Fluid: 0 %
Lymphs, Fluid: 60 %
Monocyte-Macrophage-Serous Fluid: 24 % — ABNORMAL LOW (ref 50–90)
Neutrophil Count, Fluid: 15 % (ref 0–25)
Total Nucleated Cell Count, Fluid: 520 cu mm (ref 0–1000)

## 2019-04-08 LAB — LACTATE DEHYDROGENASE, PLEURAL OR PERITONEAL FLUID: LD, Fluid: 104 U/L — ABNORMAL HIGH (ref 3–23)

## 2019-04-08 LAB — GRAM STAIN

## 2019-04-08 LAB — PH, BODY FLUID: pH, Body Fluid: 7.8

## 2019-04-08 MED ORDER — LIDOCAINE HCL 1 % IJ SOLN
INTRAMUSCULAR | Status: AC
  Filled 2019-04-08: qty 20

## 2019-04-08 NOTE — Progress Notes (Signed)
Physical Therapy Treatment Patient Details Name: Mallory Jensen MRN: [id number] DOB: 02-26-1922 Today's Date: 04/08/2019    History of Present Illness Pt is 86 yo female who presented to ED from cardiologist office. She was admitted with AKI, hyperkalemia, euthyroid sick syndrome, Diastolic CHF, and ascites. Paracentesis on 04/05/19 drawing off 5.2 L of fluid.    PT Comments    Patient seen for mobility progression. Pt requires min guard/min A for OOB mobility. SpO2 92% or > on RA. Pt with 2/4 DOE with mobility and needed time to recover end of session. Pt will continue to benefit from further skilled PT services to maximize independence and safety with mobility.     Follow Up Recommendations  Home health PT;Supervision - Intermittent     Equipment Recommendations  None recommended by PT    Recommendations for Other Services       Precautions / Restrictions Precautions Precautions: Fall Precaution Comments: monitor O2 Restrictions Weight Bearing Restrictions: No    Mobility  Bed Mobility Overal bed mobility: Needs Assistance Bed Mobility: Supine to Sit     Supine to sit: Mod assist     General bed mobility comments: assist to elevate trunk into sitting and to scoot hips to EOB  Transfers Overall transfer level: Needs assistance Equipment used: Rolling walker (2 wheeled) Transfers: Sit to/from Stand Sit to Stand: Min assist;Min guard         General transfer comment: assist to power up and to steady from EOB, recliner, and BSC  Ambulation/Gait Ambulation/Gait assistance: Min guard Gait Distance (Feet): 120 Feet Assistive device: Rolling walker (2 wheeled) Gait Pattern/deviations: Decreased stride length;Step-through pattern;Trunk flexed Gait velocity: decreased   General Gait Details: cues for breathing technique; pt with DOE but SpO2 92% on RA; min guard for safety   Stairs             Wheelchair Mobility    Modified Rankin (Stroke Patients  Only)       Balance Overall balance assessment: Needs assistance Sitting-balance support: No upper extremity supported;Feet supported Sitting balance-Leahy Scale: Good     Standing balance support: Bilateral upper extremity supported;During functional activity Standing balance-Leahy Scale: Poor                              Cognition Arousal/Alertness: Awake/alert Behavior During Therapy: WFL for tasks assessed/performed Overall Cognitive Status: Within Functional Limits for tasks assessed                                        Exercises      General Comments General comments (skin integrity, edema, etc.): pt with DOE and needed time to recover end of session; pt educated on PLB and use of flutter valve      Pertinent Vitals/Pain Pain Assessment: No/denies pain    Home Living                      Prior Function            PT Goals (current goals can now be found in the care plan section) Progress towards PT goals: Progressing toward goals    Frequency    Min 3X/week      PT Plan Current plan remains appropriate    Co-evaluation  AM-PAC PT "6 Clicks" Mobility   Outcome Measure  Help needed turning from your back to your side while in a flat bed without using bedrails?: A Little Help needed moving from lying on your back to sitting on the side of a flat bed without using bedrails?: A Little Help needed moving to and from a bed to a chair (including a wheelchair)?: A Little Help needed standing up from a chair using your arms (e.g., wheelchair or bedside chair)?: A Little Help needed to walk in hospital room?: A Little Help needed climbing 3-5 steps with a railing? : A Little 6 Click Score: 18    End of Session Equipment Utilized During Treatment: Gait belt Activity Tolerance: Patient tolerated treatment well Patient left: in chair;with call bell/phone within reach;with chair alarm set Nurse  Communication: Mobility status PT Visit Diagnosis: Unsteadiness on feet (R26.81);Muscle weakness (generalized) (M62.81)     Time: [id number] PT Time Calculation (min) (ACUTE ONLY): 41 min  Charges:  $Gait Training: 23-37 mins $Therapeutic Activity: 8-22 mins                     Earney Navy, PTA Acute Rehabilitation Services Pager: [phone] Office: [phone]     Darliss Cheney 04/08/2019, 4:10 PM

## 2019-04-08 NOTE — Progress Notes (Signed)
PROGRESS NOTE  Mallory Jensen [id number] DOB: [birth year] DOA: 04/03/2019 PCP: Haywood Pao, MD  Brief History    The patient is a 86 yr old woman who was directed to present to the ED from her cardiologist's office where she had gone on 04/02/2019 for a follow up appointment. At that visit she complained off poor urinary output, swelling of her lower extremities and increased work of breathing. Lab work performed there demonstrated an increased creatinine, hyperkalemia.   The patient was admitted to a telemetry bed. She was given NS at 50 cc/hr. Diuretics were held. Echocardiogram has been performed. It demonstrates an EF of 70-75% and is hyperdynamic, but with no regional wall motion abnormalities. There is Grade I diastolic dysfunction. RV systolic function is normal. RV size is normal. Left atrium is mildly dilated. There is moderate aortic valve stenosis. Bilateral lower extremity dopplers were performed that were negative for DVT. X-ray of the abdomen demonstrated ascites and normal bowel gas pattern.  Nephrology has been consulted and paracentesis has been ordered.  Consultants  . Nephrology  Procedures  . Paracentesis  Antibiotics   Anti-infectives (From admission, onward)   None     Subjective  The patient for generalized weakness, denies any abdominal pain today, reports mild dyspnea at baseline .  Objective   Vitals:  Vitals:   04/07/19 2352 04/08/19 0834  BP: (!) 121/47 (!) 113/45  Pulse: 78 75  Resp:  17  Temp:  97.7 F (36.5 C)  SpO2: (!) 88% 92%   Exam:  Awake Alert, Oriented X 3, No new F.N deficits, Normal affect Symmetrical Chest wall movement, diminished air entry at the bases RRR,No Gallops,Rubs or new Murmurs, No Parasternal Heave +ve B.Sounds, Abd Soft, No tenderness, No rebound - guarding or rigidity. No Cyanosis, Clubbing or edema, No new Rash or bruise     Today's Data  . Vitals, BMP, CBC, vitamin B12   Imaging  . Lower  extremity doppler: Negative for DVT  Cardiology Data  . Echocardiogram:  It demonstrates an EF of 70-75% and is hyperdynamic, but with no regional wall motion abnormalities. There is Grade I diastolic dysfunction. RV systolic function is normal. RV size is normal. Left atrium is mildly dilated. There is moderate aortic valve stenosis.  Scheduled Meds: . aspirin EC  81 mg Oral Daily  . docusate sodium  100 mg Oral Daily  . heparin injection (subcutaneous)  5,000 Units Subcutaneous Q8H  . pantoprazole  40 mg Oral Daily  . polyethylene glycol  17 g Oral Daily  . sodium chloride flush  3 mL Intravenous Q12H   Continuous Infusions:  Active Problems:   HTN (hypertension)   Subclinical hypothyroidism   Acute kidney injury superimposed on chronic kidney disease (HCC)   Hyperkalemia   Chronic diastolic CHF (congestive heart failure) (HCC)   Normocytic anemia   Thrombocytosis (H[facility])   Pressure injury of skin   LOS: 5 days   A & P   Acute kidney injury superimposed on chronic kidney disease stage IIIb: -  Patient's baseline creatinine previously have been around 1.3 on 1/19.  Patient presents with creatinine elevated up to 2.21 with BUN 55.  The elevated BUN to creatinine ratio(24.8) suggest a prerenal renal cause of symptoms -Appreciated, currently signed off -Most likely cardiorenal, improving despite diuresis, continue to monitor and avoid nephrotoxic medications.   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 Delsa Sale from GYN oncology, she will arrange for outpatient follow-up, will obtain CEA and CA-125 .  Pleural effusion -Likely related to malignancy, will request ultrasound thoracentesis given she is symptomatic.    Hyperkalemia:  - Acute.  Resolved on renal diet.  As needed Lasix, Lokelma, and holding  losartan .   Euthyroid sick syndrome: TSH 15.2 with FT3 of 2.2 and FT4 of 0.75. Thyroid studies should be repeated in 6 weeks after the patient has recovered from the acute illness.   Acute on chronic diastolic congestive heart failure:  -Patient with at least 2+ pitting edema of the bilateral lower extremities on presentation. -Volume status significantly improved after diuresis, she does appear to be a euvolemic today.  Echocardiogram was obtained on 04/04/2019.  It demonstrates an EF of 70-75% and is hyperdynamic, but with no regional wall motion abnormalities. There is Grade I diastolic dysfunction. RV systolic function is normal. RV size is normal. Left atrium is mildly dilated. There is moderate aortic valve stenosis. Patient was initially given 40 mg of Lasix I. Await results of echocardiogram performed today. Monitor volume status.   Lower extremity edema: Exact cause not clearly known at this time. Doppler of the lower extremities is negative for DVT.  Resolved   Essential hypertension: Currently normotensive. Home medications include amlodipine 5 mg daily, Coreg 3.125 mg twice daily, furosemide 20 g daily as needed for edema, and losartan 100 mg daily. Coreg and amlodipine have been continued. The losartan and lasix have been held.    Normocytic anemia:  - stable   Thrombocytosis: Acute.  Platelet count elevated up to 404.  Resolved  Stage 1 pressure injury of sacrum: Present on admission. Wound care as per nursing.    DVT prophylaxis: heparin  Code Status: Full  Family Communication:  D/W daughter [name]  Disposition Plan:  Home with home health, will need ultrasound guided thoracentesis  Phillips Climes MD [facility] Direct contact: see [email or web address]  7PM-7AM contact night coverage as above .

## 2019-04-08 NOTE — Procedures (Signed)
PROCEDURE SUMMARY:  Successful US guided right thoracentesis. Yielded 750 mL of yellow, clear fluid. Pt tolerated procedure well. No immediate complications.  Specimen was sent for labs. CXR ordered.  EBL < 5 mL  Docia Barrier PA-C 04/08/2019 4:21 PM

## 2019-04-09 DIAGNOSIS — J90 Pleural effusion, not elsewhere classified: Secondary | ICD-10-CM

## 2019-04-09 LAB — BASIC METABOLIC PANEL
Anion gap: 7 (ref 5–15)
BUN: 35 mg/dL — ABNORMAL HIGH (ref 8–23)
CO2: 23 mmol/L (ref 22–32)
Calcium: 8.3 mg/dL — ABNORMAL LOW (ref 8.9–10.3)
Chloride: 109 mmol/L (ref 98–111)
Creatinine, Ser: 1.38 mg/dL — ABNORMAL HIGH (ref 0.44–1.00)
GFR calc Af Amer: 38 mL/min — ABNORMAL LOW (ref >60)
GFR calc non Af Amer: 33 mL/min — ABNORMAL LOW (ref >60)
Glucose, Bld: 102 mg/dL — ABNORMAL HIGH (ref 70–99)
Potassium: 5 mmol/L (ref 3.5–5.1)
Sodium: 139 mmol/L (ref 135–145)

## 2019-04-09 LAB — CBC
HCT: 30.6 % — ABNORMAL LOW (ref 36.0–46.0)
Hemoglobin: 9.6 g/dL — ABNORMAL LOW (ref 12.0–15.0)
MCH: 29.5 pg (ref 26.0–34.0)
MCHC: 31.4 g/dL (ref 30.0–36.0)
MCV: 94.2 fL (ref 80.0–100.0)
Platelets: 310 10*3/uL (ref 150–400)
RBC: 3.25 MIL/uL — ABNORMAL LOW (ref 3.87–5.11)
RDW: 16.8 % — ABNORMAL HIGH (ref 11.5–15.5)
WBC: 10.6 10*3/uL — ABNORMAL HIGH (ref 4.0–10.5)
nRBC: 0.2 % (ref 0.0–0.2)

## 2019-04-09 LAB — CA 125: Cancer Antigen (CA) 125: 693 U/mL — ABNORMAL HIGH (ref 0.0–38.1)

## 2019-04-09 LAB — CEA: CEA: 1.7 ng/mL (ref 0.0–4.7)

## 2019-04-09 LAB — CYTOLOGY - NON PAP

## 2019-04-09 MED ORDER — SODIUM ZIRCONIUM CYCLOSILICATE 10 G PO PACK
10.0000 g | PACK | Freq: Every day | ORAL | Status: AC
  Administered 2019-04-09: 10 g via ORAL
  Filled 2019-04-09: qty 1

## 2019-04-09 NOTE — Progress Notes (Signed)
Physical Therapy Treatment Patient Details Name: Mallory Jensen MRN: [id number] DOB: [birth year] Today's Date: 04/09/2019    History of Present Illness Pt is 86 yo female who presented to ED from cardiologist office. She was admitted with AKI, hyperkalemia, euthyroid sick syndrome, Diastolic CHF, and ascites. Paracentesis on 04/05/19 drawing off 5.2 L of fluid.    PT Comments    Pt admitted with above diagnosis. Pt was able to ambulate with RW with min guard assist with good stability overall. Took several standing rest breaks.  Pt working on pursed lip breathing.  Pt currently with functional limitations due to balance and endurance deficits. Pt will benefit from skilled PT to increase their independence and safety with mobility to allow discharge to the venue listed below.     Follow Up Recommendations  Home health PT;Supervision - Intermittent     Equipment Recommendations  None recommended by PT    Recommendations for Other Services       Precautions / Restrictions Precautions Precautions: Fall Precaution Comments: monitor O2 Restrictions Weight Bearing Restrictions: No    Mobility  Bed Mobility               General bed mobility comments: in chair  Transfers Overall transfer level: Needs assistance Equipment used: Rolling walker (2 wheeled) Transfers: Sit to/from Stand Sit to Stand: Min guard         General transfer comment: no physical assist to stand  Ambulation/Gait Ambulation/Gait assistance: Min guard Gait Distance (Feet): 150 Feet Assistive device: Rolling walker (2 wheeled) Gait Pattern/deviations: Decreased stride length;Step-through pattern;Trunk flexed Gait velocity: decreased   General Gait Details: cues for breathing technique; pt with DOE but SpO2 92% on RA; min guard for safety   Stairs             Wheelchair Mobility    Modified Rankin (Stroke Patients Only)       Balance Overall balance assessment: Needs  assistance Sitting-balance support: No upper extremity supported;Feet supported Sitting balance-Leahy Scale: Good     Standing balance support: Bilateral upper extremity supported;During functional activity Standing balance-Leahy Scale: Poor Standing balance comment: needs bil UE support                            Cognition Arousal/Alertness: Awake/alert Behavior During Therapy: WFL for tasks assessed/performed Overall Cognitive Status: Within Functional Limits for tasks assessed                                        Exercises General Exercises - Lower Extremity Long Arc Quad: AROM;Both;10 reps;Seated    General Comments General comments (skin integrity, edema, etc.): had some pain in chest which resolved once sitting. notified nursing. Hr 94 bpm and sat 91% on RA      Pertinent Vitals/Pain Pain Assessment: No/denies pain    Home Living                      Prior Function            PT Goals (current goals can now be found in the care plan section) Acute Rehab PT Goals Patient Stated Goal: have a cup of good coffee Progress towards PT goals: Progressing toward goals    Frequency    Min 3X/week      PT Plan Current plan remains appropriate  Co-evaluation              AM-PAC PT "6 Clicks" Mobility   Outcome Measure  Help needed turning from your back to your side while in a flat bed without using bedrails?: A Little Help needed moving from lying on your back to sitting on the side of a flat bed without using bedrails?: A Little Help needed moving to and from a bed to a chair (including a wheelchair)?: A Little Help needed standing up from a chair using your arms (e.g., wheelchair or bedside chair)?: A Little Help needed to walk in hospital room?: A Little Help needed climbing 3-5 steps with a railing? : A Little 6 Click Score: 18    End of Session Equipment Utilized During Treatment: Gait belt Activity  Tolerance: Patient tolerated treatment well Patient left: in chair;with call bell/phone within reach;with chair alarm set Nurse Communication: Mobility status PT Visit Diagnosis: Unsteadiness on feet (R26.81);Muscle weakness (generalized) (M62.81)     Time: [id number] PT Time Calculation (min) (ACUTE ONLY): 19 min  Charges:  $Gait Training: 8-22 mins                     Loa Idler W,PT [place] Pager:  [phone]  Office:  [place] 04/09/2019, 3:13 PM

## 2019-04-09 NOTE — TOC Initial Note (Signed)
Transition of Care [facility]) - Initial/Assessment Note    Patient Details  Name: Mallory Jensen MRN: [id number] Date of Birth: 06/08/1922  [facility] CM/SW Contact:    Pollie Friar, RN Phone Number: 04/09/2019, 3:40 PM  Clinical Narrative:                 Daughter: [name] interested in [facility] through [place] for [facility]. CM has given the referral to [facility] with Alvis Lemmings and he will contact the family.  TOC following for further d/c needs.   Expected Discharge Plan: [place] Barriers to Discharge: Continued Medical Work up   Patient Goals and CMS Choice   CMS Medicare.gov Compare Post Acute Care list provided to:: Patient Represenative (must comment) Choice offered to / list presented to : Adult Children  Expected Discharge Plan and Services Expected Discharge Plan: [place] Choice: [place] arrangements for the past 2 months: [place] Agency: [place] Date [facility] Agency Contacted: 04/09/19   Representative spoke with at [place]: Tommi Rumps for home first  Prior Living Arrangements/Services Living arrangements for the past 2 months: [place] Lives with:: Self Patient language and need for interpreter reviewed:: Yes Do you feel safe going back to the place where you live?: Yes      Need for Family Participation in Patient Care: Yes (Comment) Care giver support system in place?: Yes (comment) Current home services: (remote health and care connections) Criminal Activity/Legal Involvement Pertinent to Current Situation/Hospitalization: No - Comment as needed  Activities of Daily Living Home Assistive Devices/Equipment: Walker (specify type) ADL Screening (condition at time of admission) Patient's cognitive ability adequate to safely complete daily activities?: Yes Is the patient deaf or have difficulty hearing?: Yes Does the patient  have difficulty seeing, even when wearing glasses/contacts?: No Does the patient have difficulty concentrating, remembering, or making decisions?: No Patient able to express need for assistance with ADLs?: Yes Does the patient have difficulty dressing or bathing?: Yes Independently performs ADLs?: No Communication: Independent Dressing (OT): Needs assistance Is this a change from baseline?: Change from baseline, expected to last <3days Grooming: Needs assistance Is this a change from baseline?: Pre-admission baseline Feeding: Independent Bathing: Dependent Is this a change from baseline?: Pre-admission baseline Toileting: Independent In/Out Bed: Independent Walks in Home: Independent with device (comment)(FRONT WHEEL WALKER) Does the patient have difficulty walking or climbing stairs?: Yes Weakness of Legs: Both Weakness of Arms/Hands: Both  Permission Sought/Granted                  Emotional Assessment Appearance:: Appears stated age         Psych Involvement: No (comment)  Admission diagnosis:  Hyperkalemia [E87.5] Hypothyroidism [E03.9] AKI (acute kidney injury) ([place]) [N17.9] Patient Active Problem List   Diagnosis Date Noted  . Pressure injury of skin 04/04/2019  . Subclinical hypothyroidism 04/03/2019  . Acute kidney injury superimposed on chronic kidney disease ([place]) 04/03/2019  . Hyperkalemia 04/03/2019  . Chronic diastolic CHF (congestive heart failure) ([place]) 04/03/2019  . Normocytic anemia 04/03/2019  . Thrombocytosis ([place]) 04/03/2019  . Macular hole of left eye 10/09/2016  . Osteoporosis 03/15/2012  . Thoracic compression fracture ([place]) 03/15/2012  . Altered mental status 03/15/2012  .  Pancreatitis 03/13/2012  . HTN (hypertension)   . Hypercholesteremia   . RBBB   . Mild aortic stenosis    PCP:  Tisovec, Fransico Him, MD Pharmacy:   [id number] - , [place] [address] Phone: [phone]  Fax: [phone]     Social Determinants of Health (SDOH) Interventions    Readmission Risk Interventions No flowsheet data found.

## 2019-04-09 NOTE — Progress Notes (Signed)
PROGRESS NOTE  Mallory Jensen [id number] DOB: 1922-07-23 DOA: 04/03/2019 PCP: Haywood Pao, MD  Brief History    The patient is a 86 yr old woman who was directed to present to the ED from her cardiologist's office where she had gone on 04/02/2019 for a follow up appointment. At that visit she complained off poor urinary output, swelling of her lower extremities and increased work of breathing. Lab work performed there demonstrated an increased creatinine, hyperkalemia.  As well work-up significant for ascites, and leg swelling, 2D echo significant for diastolic CHF and preserved EF, patient with significant ascites, status post paracentesis significant for malignant cells of GYN etiology, as well she had right pleural effusion status post thoracentesis.  Consultants  . Nephrology  Procedures  . Paracentesis . Thoracentesis  Antibiotics   Anti-infectives (From admission, onward)   None     Subjective  The patient for generalized weakness, denies any abdominal pain today, she reports some cough.  Objective   Vitals:  Vitals:   04/09/19 0600 04/09/19 0814  BP: (!) 117/54 (!) 133/53  Pulse:  76  Resp:  16  Temp:  97.9 F (36.6 C)  SpO2: 91% 94%   Exam:  Awake Alert, Oriented X 3, No new F.N deficits, Normal affect Symmetrical Chest wall movement, diminished air entry at the bases  RRR,No Gallops,Rubs or new Murmurs, No Parasternal Heave +ve B.Sounds, Abd Soft, No tenderness, No rebound - guarding or rigidity. No Cyanosis, Clubbing or edema, No new Rash or bruise      Today's Data  . Vitals, BMP, CBC, vitamin B12   Imaging  . Lower extremity doppler: Negative for DVT  Cardiology Data  . Echocardiogram:  It demonstrates an EF of 70-75% and is hyperdynamic, but with no regional wall motion abnormalities. There is Grade I diastolic dysfunction. RV systolic function is normal. RV size is normal. Left atrium is mildly dilated. There is moderate aortic valve  stenosis.  Scheduled Meds: . aspirin EC  81 mg Oral Daily  . docusate sodium  100 mg Oral Daily  . heparin injection (subcutaneous)  5,000 Units Subcutaneous Q8H  . pantoprazole  40 mg Oral Daily  . polyethylene glycol  17 g Oral Daily  . sodium chloride flush  3 mL Intravenous Q12H   Continuous Infusions:  Active Problems:   HTN (hypertension)   Subclinical hypothyroidism   Acute kidney injury superimposed on chronic kidney disease (HCC)   Hyperkalemia   Chronic diastolic CHF (congestive heart failure) (HCC)   Normocytic anemia   Thrombocytosis (HCC)   Pressure injury of skin   LOS: 6 days   A & P   Acute kidney injury superimposed on chronic kidney disease stage IIIb: -  Patient's baseline creatinine previously have been around 1.3 on 1/19.  Patient presents with creatinine elevated up to 2.21 with BUN 55.  The elevated BUN to creatinine ratio(24.8) suggest a prerenal renal cause of symptoms -Renal input Appreciated, currently signed off -Most likely cardiorenal, improving despite diuresis, continue to monitor and avoid nephrotoxic medications. -Avoid nephrotoxic medications   Malignant ascites: -Paracentesis 5.2 L on 2/23, , cytology showing malignant cells, highly suspicious for GYN origin, patient with known history of right ovarian mass , likely causing her malignancy, discussed with Dr. Glennon Mac moore/Dr. Candis Schatz,  From  oncology, patient is not a surgical candidate, so she will follow with oncology service next week regarding further recommendation and management, CEA, CA-125, CEA a 19-9 has been sent .  Pleural  effusion -Likely related to malignancy, status post thoracentesis 2/25 with 750 cc drained, follow on cytology   Hyperkalemia:  - Acute.  Resolved on renal diet.  As needed Lasix, Lokelma, and holding losartan .   Euthyroid sick syndrome: TSH 15.2 with FT3 of 2.2 and FT4 of 0.75. Thyroid studies should be repeated in 6 weeks after the patient has recovered from  the acute illness.   Acute on chronic diastolic congestive heart failure:  -Patient with at least 2+ pitting edema of the bilateral lower extremities on presentation. -Volume status significantly improved after diuresis, she does appear to be a euvolemic today.  Echocardiogram was obtained on 04/04/2019.  It demonstrates an EF of 70-75% and is hyperdynamic, but with no regional wall motion abnormalities. There is Grade I diastolic dysfunction. RV systolic function is normal. RV size is normal. Left atrium is mildly dilated. There is moderate aortic valve stenosis. Patient was initially given 40 mg of Lasix I. Await results of echocardiogram performed today. Monitor volume status.   Lower extremity edema: Exact cause not clearly known at this time. Doppler of the lower extremities is negative for DVT.  Resolved   Essential hypertension: Currently normotensive. Home medications include amlodipine 5 mg daily, Coreg 3.125 mg twice daily, furosemide 20 g daily as needed for edema, and losartan 100 mg daily. Coreg and amlodipine have been continued. The losartan and lasix have been held.    Normocytic anemia:  - stable   Thrombocytosis: Acute.  Platelet count elevated up to 404.  Resolved  Stage 1 pressure injury of sacrum: Present on admission. Wound care as per nursing.    DVT prophylaxis: heparin  Code Status: Full  Family Communication:  D/W daughter Wendelyn Breslow bedside Disposition Plan:  Home with home health tomorrow  Procedures: Paracentesis 2/23 Thoracentesis 2/25  Phillips Climes MD [facility] Direct contact: see [email or web address]  7PM-7AM contact night coverage as above .

## 2019-04-09 NOTE — Progress Notes (Signed)
Occupational Therapy Treatment Patient Details Name: Mallory Jensen MRN: [id number] DOB: 07/31/1922 Today's Date: 04/09/2019    History of present illness Pt is 86 yo female who presented to ED from cardiologist office. She was admitted with AKI, hyperkalemia, euthyroid sick syndrome, Diastolic CHF, and ascites. Paracentesis on 04/05/19 drawing off 5.2 L of fluid.   OT comments  Pt less dyspneic with ADL. Ambulated to bathroom, sink and remained up in chair for breakfast brought by her daughter. Pt educated in breathing techniques and energy conservation strategies.   Follow Up Recommendations  Home health OT    Equipment Recommendations  None recommended by OT    Recommendations for Other Services      Precautions / Restrictions Precautions Precautions: Fall Precaution Comments: monitor O2       Mobility Bed Mobility Overal bed mobility: Needs Assistance Bed Mobility: Supine to Sit     Supine to sit: Min assist     General bed mobility comments: pulled on therapist's hand to scoot to EOB  Transfers Overall transfer level: Needs assistance Equipment used: Rolling walker (2 wheeled) Transfers: Sit to/from Stand Sit to Stand: Min guard         General transfer comment: no physical assist to stand    Balance Overall balance assessment: Needs assistance Sitting-balance support: No upper extremity supported;Feet supported Sitting balance-Leahy Scale: Good     Standing balance support: Bilateral upper extremity supported;During functional activity Standing balance-Leahy Scale: Poor Standing balance comment: statically at sink, poor with dynamic balance activities                           ADL either performed or assessed with clinical judgement   ADL Overall ADL's : Needs assistance/impaired     Grooming: Wash/dry hands;Min guard;Standing           Upper Body Dressing : Set up;Sitting       Toilet Transfer: Min guard;Ambulation;RW    Toileting- Clothing Manipulation and Hygiene: Minimal assistance;Sit to/from stand       Functional mobility during ADLs: Min guard;Rolling walker General ADL Comments: less dyspneic with exertion     Vision       Perception     Praxis      Cognition Arousal/Alertness: Awake/alert Behavior During Therapy: WFL for tasks assessed/performed Overall Cognitive Status: Within Functional Limits for tasks assessed                                          Exercises     Shoulder Instructions       General Comments      Pertinent Vitals/ Pain       Pain Assessment: No/denies pain  Home Living                                          Prior Functioning/Environment              Frequency  Min 2X/week        Progress Toward Goals  OT Goals(current goals can now be found in the care plan section)  Progress towards OT goals: Progressing toward goals  Acute Rehab OT Goals Patient Stated Goal: have a cup of good coffee OT Goal Formulation: With patient Time For  Goal Achievement: 04/21/19 Potential to Achieve Goals: Good  Plan Discharge plan remains appropriate    Co-evaluation                 AM-PAC OT "6 Clicks" Daily Activity     Outcome Measure   Help from another person eating meals?: None Help from another person taking care of personal grooming?: A Little Help from another person toileting, which includes using toliet, bedpan, or urinal?: A Little Help from another person bathing (including washing, rinsing, drying)?: A Little Help from another person to put on and taking off regular upper body clothing?: None Help from another person to put on and taking off regular lower body clothing?: A Little 6 Click Score: 20    End of Session Equipment Utilized During Treatment: Gait belt;Rolling walker  OT Visit Diagnosis: Unsteadiness on feet (R26.81);Other abnormalities of gait and mobility (R26.89);Muscle weakness  (generalized) (M62.81);Other (comment)(decreased activity tolerance)   Activity Tolerance Patient tolerated treatment well   Patient Left in chair;with call bell/phone within reach;with family/visitor present   Nurse Communication          Time: [id number] OT Time Calculation (min): 26 min  Charges: OT General Charges $OT Visit: 1 Visit OT Treatments $Self Care/Home Management : 23-37 mins  Nestor Lewandowsky, OTR/L Acute Rehabilitation Services Pager: [phone] Office: [phone]   Malka So 04/09/2019, 10:34 AM

## 2019-04-10 LAB — CULTURE, BODY FLUID W GRAM STAIN -BOTTLE: Culture: NO GROWTH

## 2019-04-10 LAB — CANCER ANTIGEN 19-9: CA 19-9: 21 U/mL (ref 0–35)

## 2019-04-10 NOTE — Care Management (Signed)
Notified Barb Merino that patient will DC today.

## 2019-04-10 NOTE — Discharge Instructions (Signed)
Ascites  Ascites is a collection of too much fluid in the abdomen. Ascites can range from mild to severe. If ascites is not treated, it can get worse. What are the causes? This condition may be caused by:  A liver condition called cirrhosis. This is the most common cause of ascites.  Long-term (chronic) or alcoholic hepatitis.  Infection or inflammation in the abdomen.  Cancer in the abdomen.  Heart failure.  Kidney disease.  Inflammation of the pancreas.  Clots in the veins of the liver. What are the signs or symptoms? Symptoms of this condition include:  A feeling of fullness in the abdomen. This is common.  An increase in the size of the abdomen or waist.  Swelling in the legs.  Swelling of the scrotum (in men).  Difficulty breathing.  Pain in the abdomen.  Sudden weight gain. If the condition is mild, you may not have symptoms. How is this diagnosed? This condition is diagnosed based on your medical history and a physical exam. Your health care provider may order imaging tests, such as an ultrasound or CT scan of your abdomen. How is this treated? Treatment for this condition depends on the cause of the ascites. It may include:  Taking a pill to make you urinate. This is called a water pill (diuretic pill).  Strictly reducing your salt (sodium) intake. Salt can cause extra fluid to be kept (retained) in the body, and this makes ascites worse.  Having a procedure to remove fluid from your abdomen (paracentesis).  Having a procedure that connects two of the major veins within your liver and relieves pressure on your liver. This is called a TIPS procedure (transjugular intrahepatic portosystemic shunt procedure).  Placement of a drainage catheter (peritoneovenous shunt) to manage the extra fluid in the abdomen. Ascites may go away or improve when the condition that caused it is treated. Follow these instructions at home:  Keep track of your weight. To do  this, weigh yourself at the same time every day and write down your weight.  Keep track of how much you drink and any changes in how much or how often you urinate.  Follow any instructions that your health care provider gives you about how much to drink.  Try not to eat salty (high-sodium) foods.  Take over-the-counter and prescription medicines only as told by your health care provider.  Keep all follow-up visits as told by your health care provider. This is important.  Report any changes in your health to your health care provider, especially if you develop new symptoms or your symptoms get worse. Contact a health care provider if:  You gain more than 3 lb (1.36 kg) in 3 days.  Your waist size increases.  You have new swelling in your legs.  The swelling in your legs gets worse. Get help right away if:  You have a fever.  You are confused.  You have new or worsening breathing trouble.  You have new or worsening pain in your abdomen.  You have new or worsening swelling in the scrotum (in men). Summary  Ascites is a collection of too much fluid in the abdomen.  Ascites may be caused by various conditions, such as cirrhosis, hepatitis, cancer, or congestive heart failure.  Symptoms may include swelling of the abdomen and other areas due to extra fluid in the body.  Treatments may involve dietary changes, medicines, or procedures. This information is not intended to replace advice given to you by your health  care provider. Make sure you discuss any questions you have with your health care provider. Document Revised: 12/30/2017 Document Reviewed: 10/10/2016 Elsevier Patient Education  2020 [place].   Acute Kidney Injury, Adult  Acute kidney injury is a sudden worsening of kidney function. The kidneys are organs that have several jobs. They filter the blood to remove waste products and extra fluid. They also maintain a healthy balance of minerals and hormones in the  body, which helps control blood pressure and keep bones strong. With this condition, your kidneys do not do their jobs as well as they should. This condition ranges from mild to severe. Over time it may develop into long-lasting (chronic) kidney disease. Early detection and treatment may prevent acute kidney injury from developing into a chronic condition. What are the causes? Common causes of this condition include:  A problem with blood flow to the kidneys. This may be caused by: ? Low blood pressure (hypotension) or shock. ? Blood loss. ? Heart and blood vessel (cardiovascular) disease. ? Severe burns. ? Liver disease.  Direct damage to the kidneys. This may be caused by: ? Certain medicines. ? A kidney infection. ? Poisoning. ? Being around or in contact with toxic substances. ? A surgical wound. ? A hard, direct hit to the kidney area.  A sudden blockage of urine flow. This may be caused by: ? Cancer. ? Kidney stones. ? An enlarged prostate in males. What are the signs or symptoms? Symptoms of this condition may not be obvious until the condition becomes severe. Symptoms of this condition can include:  Tiredness (lethargy), or difficulty staying awake.  Nausea or vomiting.  Swelling (edema) of the face, legs, ankles, or feet.  Problems with urination, such as: ? Abdominal pain, or pain along the side of your stomach (flank). ? Decreased urine production. ? Decrease in the force of urine flow.  Muscle twitches and cramps, especially in the legs.  Confusion or trouble concentrating.  Loss of appetite.  Fever. How is this diagnosed? This condition may be diagnosed with tests, including:  Blood tests.  Urine tests.  Imaging tests.  A test in which a sample of tissue is removed from the kidneys to be examined under a microscope (kidney biopsy). How is this treated? Treatment for this condition depends on the cause and how severe the condition is. In mild cases,  treatment may not be needed. The kidneys may heal on their own. In more severe cases, treatment will involve:  Treating the cause of the kidney injury. This may involve changing any medicines you are taking or adjusting your dosage.  Fluids. You may need specialized IV fluids to balance your body's needs.  Having a catheter placed to drain urine and prevent blockages.  Preventing problems from occurring. This may mean avoiding certain medicines or procedures that can cause further injury to the kidneys. In some cases treatment may also require:  A procedure to remove toxic wastes from the body (dialysis or continuous renal replacement therapy - CRRT).  Surgery. This may be done to repair a torn kidney, or to remove the blockage from the urinary system. Follow these instructions at home: Medicines  Take over-the-counter and prescription medicines only as told by your health care provider.  Do not take any new medicines without your health care provider's approval. Many medicines can worsen your kidney damage.  Do not take any vitamin and mineral supplements without your health care provider's approval. Many nutritional supplements can worsen your kidney damage.  Lifestyle  If your health care provider prescribed changes to your diet, follow them. You may need to decrease the amount of protein you eat.  Achieve and maintain a healthy weight. If you need help with this, ask your health care provider.  Start or continue an exercise plan. Try to exercise at least 30 minutes a day, 5 days a week.  Do not use any tobacco products, such as cigarettes, chewing tobacco, and e-cigarettes. If you need help quitting, ask your health care provider. General instructions  Keep track of your blood pressure. Report changes in your blood pressure as told by your health care provider.  Stay up to date with immunizations. Ask your health care provider which immunizations you need.  Keep all follow-up  visits as told by your health care provider. This is important. Where to find more information  American Association of Kidney Patients: BombTimer.gl  National Kidney Foundation: www.kidney.[place]: https://mathis.com/  [facility]: ? www.lifeoptions.org ? www.kidneyschool.org Contact a health care provider if:  Your symptoms get worse.  You develop new symptoms. Get help right away if:  You develop symptoms of worsening kidney disease, which include: ? Headaches. ? Abnormally dark or light skin. ? Easy bruising. ? Frequent hiccups. ? Chest pain. ? Shortness of breath. ? End of menstruation in women. ? Seizures. ? Confusion or altered mental status. ? Abdominal or back pain. ? Itchiness.  You have a fever.  Your body is producing less urine.  You have pain or bleeding when you urinate. Summary  Acute kidney injury is a sudden worsening of kidney function.  Acute kidney injury can be caused by problems with blood flow to the kidneys, direct damage to the kidneys, and sudden blockage of urine flow.  Symptoms of this condition may not be obvious until it becomes severe. Symptoms may include edema, lethargy, confusion, nausea or vomiting, and problems passing urine.  This condition can usually be diagnosed with blood tests, urine tests, and imaging tests. Sometimes a kidney biopsy is done to diagnose this condition.  Treatment for this condition often involves treating the underlying cause. It is treated with fluids, medicines, dialysis, diet changes, or surgery. This information is not intended to replace advice given to you by your health care provider. Make sure you discuss any questions you have with your health care provider. Document Revised: 01/10/2017 Document Reviewed: 01/19/2016 Elsevier Patient Education  [place].   Abdominal Bloating When you have abdominal bloating, your abdomen may feel full, tight, or  painful. It may also look bigger than normal or swollen (distended). Common causes of abdominal bloating include:  Swallowing air.  Constipation.  Problems digesting food.  Eating too much.  Irritable bowel syndrome. This is a condition that affects the large intestine.  Lactose intolerance. This is an inability to digest lactose, a natural sugar in dairy products.  Celiac disease. This is a condition that affects the ability to digest gluten, a protein found in some grains.  Gastroparesis. This is a condition that slows down the movement of food in the stomach and small intestine. It is more common in people with diabetes mellitus.  Gastroesophageal reflux disease (GERD). This is a digestive condition that makes stomach acid flow back into the esophagus.  Urinary retention. This means that the body is holding onto urine, and the bladder cannot be emptied all the way. Follow these instructions at home: Eating and drinking  Avoid eating too much.  Try not to  swallow air while talking or eating.  Avoid eating while lying down.  Avoid these foods and drinks: ? Foods that cause gas, such as broccoli, cabbage, cauliflower, and baked beans. ? Carbonated drinks. ? Hard candy. ? Chewing gum. Medicines  Take over-the-counter and prescription medicines only as told by your health care provider.  Take probiotic medicines. These medicines contain live bacteria or yeasts that can help digestion.  Take coated peppermint oil capsules. Activity  Try to exercise regularly. Exercise may help to relieve bloating that is caused by gas and relieve constipation. General instructions  Keep all follow-up visits as told by your health care provider. This is important. Contact a health care provider if:  You have nausea and vomiting.  You have diarrhea.  You have abdominal pain.  You have unusual weight loss or weight gain.  You have severe pain, and medicines do not help. Get help  right away if:  You have severe chest pain.  You have trouble breathing.  You have shortness of breath.  You have trouble urinating.  You have darker urine than normal.  You have blood in your stools or have dark, tarry stools. Summary  Abdominal bloating means that the abdomen is swollen.  Common causes of abdominal bloating are swallowing air, constipation, and problems digesting food.  Avoid eating too much and avoid swallowing air.  Avoid foods that cause gas, carbonated drinks, hard candy, and chewing gum. This information is not intended to replace advice given to you by your health care provider. Make sure you discuss any questions you have with your health care provider. Document Revised: 05/18/2018 Document Reviewed: 03/01/2016 Elsevier Patient Education  2020 [place]. Follow with Primary MD Tisovec, Fransico Him, MD in 7 days   Get CBC, CMP, 2 view Chest X ray checked  by Primary MD next visit.    Activity: As tolerated with Full fall precautions use walker/cane & assistance as needed   Disposition Home    Diet: Heart Healthy  , with feeding assistance and aspiration precautions.  For Heart failure patients - Check your Weight same time everyday, if you gain over 2 pounds, or you develop in leg swelling, experience more shortness of breath or chest pain, call your Primary MD immediately. Follow Cardiac Low Salt Diet and 1.5 lit/day fluid restriction.   On your next visit with your primary care physician please Get Medicines reviewed and adjusted.   Please request your Prim.MD to go over all Hospital Tests and Procedure/Radiological results at the follow up, please get all Hospital records sent to your Prim MD by signing hospital release before you go home.   If you experience worsening of your admission symptoms, develop shortness of breath, life threatening emergency, suicidal or homicidal thoughts you must seek medical attention immediately by calling 911  or calling your MD immediately  if symptoms less severe.  You Must read complete instructions/literature along with all the possible adverse reactions/side effects for all the Medicines you take and that have been prescribed to you. Take any new Medicines after you have completely understood and accpet all the possible adverse reactions/side effects.   Do not drive, operating heavy machinery, perform activities at heights, swimming or participation in water activities or provide baby sitting services if your were admitted for syncope or siezures until you have seen by Primary MD or a Neurologist and advised to do so again.  Do not drive when taking Pain medications.    Do not take more than prescribed  Pain, Sleep and Anxiety Medications  Special Instructions: If you have smoked or chewed Tobacco  in the last 2 yrs please stop smoking, stop any regular Alcohol  and or any Recreational drug use.  Wear Seat belts while driving.   Please note  You were cared for by a hospitalist during your hospital stay. If you have any questions about your discharge medications or the care you received while you were in the hospital after you are discharged, you can call the unit and asked to speak with the hospitalist on call if the hospitalist that took care of you is not available. Once you are discharged, your primary care physician will handle any further medical issues. Please note that NO REFILLS for any discharge medications will be authorized once you are discharged, as it is imperative that you return to your primary care physician (or establish a relationship with a primary care physician if you do not have one) for your aftercare needs so that they can reassess your need for medications and monitor your lab values.

## 2019-04-10 NOTE — Discharge Summary (Signed)
Mallory Jensen, is a 86 y.o. female  DOB 1922/11/27  MRN [id number].  Admission date:  04/03/2019  Admitting Physician  Norval Morton, MD  Discharge Date:  04/10/2019   Primary MD  Tisovec, Fransico Him, MD  Recommendations for primary care physician for things to follow:  - please check CBC, CMP during next visit. -Patient to follow with oncology as an outpatient, Dr. Alvy Bimler will schedule an appointment for this coming week.   Admission Diagnosis  Hyperkalemia [E87.5] Hypothyroidism [E03.9] AKI (acute kidney injury) ([place]) [N17.9]   Discharge Diagnosis  Hyperkalemia [E87.5] Hypothyroidism [E03.9] AKI (acute kidney injury) ([place]) [N17.9]  Active Problems:   HTN (hypertension)   Subclinical hypothyroidism   Acute kidney injury superimposed on chronic kidney disease (HCC)   Hyperkalemia   Chronic diastolic CHF (congestive heart failure) (HCC)   Normocytic anemia   Thrombocytosis (HCC)   Pressure injury of skin      Past Medical History:  Diagnosis Date  . Acute pancreatitis January 2014  . Chronic back pain   . Chronic fatigue   . Compression fracture of vertebral column ([place])   . Headache    PMH  . HTN (hypertension)   . Hypercholesteremia   . Macular hole   . Mild aortic stenosis   . MRSA (methicillin resistant Staphylococcus aureus)   . Osteoporosis   . Ovarian cyst   . RBBB   . Staph infection     Past Surgical History:  Procedure Laterality Date  . ABDOMINAL HYSTERECTOMY    . APPENDECTOMY    . athroscopy Right shoulder    . Biopsy Left Breast x 2    . CESAREAN SECTION    . CHOLECYSTECTOMY    . IR PARACENTESIS  04/05/2019  . IR THORACENTESIS ASP PLEURAL SPACE W/IMG GUIDE  04/08/2019  . PARS PLANA VITRECTOMY Left 10/09/2016   Procedure: PARS PLANA VITRECTOMY WITH 25 GAUGE;  Surgeon: Bernarda Caffey, MD;  Location: [facility];  Service: Ophthalmology;  Laterality: Left;  . PARS  PLANA VITRECTOMY W/ REPAIR OF MACULAR HOLE Right 2015       History of present illness and  Hospital Course:     Kindly see H&P for history of present illness and admission details, please review complete Labs, Consult reports and Test reports for all details in brief  HPI  from the history and physical done on the day of admission 04/03/2019  HPI: Mallory Jensen is a 86 y.o. female with medical history significant of hypertension, hyperlipidemia, aortic stenosis, chronic fatigue, osteoporosis, and RBBB.  Patient presents after being found to have abnormal lab work.  She had went to a routine follow-up with her cardiologist yesterday afternoon.  Review of records notes labs form 2/19, significant for potassium 6.1, BUN 52, and creatinine 1.99. over the last several weeks to months she has becoming more tired and fatigued.  She complains of leg swelling that is worse in the left leg.  Patient reports taking 1 to 2 pills of her diuretic lately without  any improvement in symptoms.  At baseline patient reports that she has not been drinking much fluids and has been more sedimentary since the start of the pandemic.  Other associated symptoms include that she feels like she has been urinating less despite the diuretics.  Currently only using 2 pads per night and previously required 3.  Her stools are dark, but relates that to being on iron supplementation and she has not seen any gross blood.  She had an issue with her thyroid over 50 years ago where she was on medication for short period of time, but thereafter did not require.  Review of records note that her TSH was 2.214 within normal limits back in 2014.  She is scheduled to get her second dose Covid 19 vaccine on Monday at 8 AM.  ED Course: Upon admission into the emergency department patient was noted to have a temperature of 95.2 F-97.6, respiration 14-23, and all other vital signs maintained.  Labs significant for hemoglobin 9.3 with elevated  RDW, platelets 404, potassium 5.9, BUN 55, and creatinine 2.2.  Chest x-ray showed increased in a hazy opacity of the right lower lobe at least partially to be a pleural effusion.  Patient was given IV and 10 mg of Lokelma.  TRH called to admit.   Hospital Course  The patient is a 86 yr old woman who was directed to present to the ED from her cardiologist's office where she had gone on 04/02/2019 for a follow up appointment. At that visit she complained off poor urinary output, swelling of her lower extremities and increased work of breathing. Lab work performed there demonstrated an increased creatinine, hyperkalemia.  As well work-up significant for ascites, and leg swelling, 2D echo significant for diastolic CHF and preserved EF, patient with significant ascites, status post paracentesis significant for malignant cells of GYN etiology, as well she had right pleural effusion status post thoracentesis.    Acute kidney injury superimposed on chronic kidney disease stage IIIb: -  Patient's baseline creatinine previously have been around 1.3 on 1/19. Patient presents with creatinine elevated up to 2.21 with BUN 55. The elevated BUN to creatinine ratio(24.8)suggest a prerenal  cause of symptoms. -Renal input Appreciated, currently signed off -Most likely cardiorenal, improving despite diuresis, continue to hold nephrotoxic medication, naproxen and losartan has been stopped on discharge, continue with Lasix on as-needed basis only .   Malignant ascites/malignant pleural effusion /due to underlying ovarian cancer: -Paracentesis 5.2 L on 2/23,  and right thoracentesis 2/25 with 750 cc drained, cytology showing malignant cells, highly suspicious for GYN origin, patient with known history of right ovarian mass , likely causing her malignancy, discussed with Dr. Glennon Mac moore/Dr. Candis Schatz,  From  oncology, patient is not a surgical candidate, so she will follow with oncology service next week regarding  further recommendation and management, CA 19-9 within normal limit,  CEA wnl, , CA-125 elevated at 693.  Pleural effusion -Likely related to malignancy, status post thoracentesis 2/25 with 750 cc drained, follow on cytology  Hyperkalemia:  - Acute. Resolved on renal diet.  As needed Lasix, losartan has been discontinued on discharge  Euthyroid sick syndrome: TSH 15.2 with FT3 of 2.2 and FT4 of 0.75. Thyroid studies should be repeated in 6 weeks after the patient has recovered from the acute illness.  Acute on chronic diastolic congestive heart failure: -Patient with at least 2+ pitting edema of the bilateral lower extremities on presentation. -Volume status significantly improved after diuresis, she does appear to  be a euvolemic today.  Echocardiogram was obtained on 04/04/2019.  It demonstrates an EF of 70-75% and is hyperdynamic, but with no regional wall motion abnormalities. There is Grade I diastolic dysfunction. RV systolic function is normal. RV size is normal. Left atrium is mildly dilated. There is moderate aortic valve stenosis. Patient is euvolemic at time of discharge, continue with Lasix as needed  Lower extremity edema: Exact cause not clearly known at this time. Doppler of the lower extremities is negative for DVT.  Resolved  Essential hypertension: Currently normotensive. Home medications include amlodipine 5 mg daily, Coreg 3.125 mg twice daily, furosemide 20 g daily as needed for edema, and losartan 100 mg daily.  Overall her blood pressure has been soft to low during hospital stay, so all medication has been stopped, despite that he maintained good blood pressure, so all antihypertensive regimen has been discontinued on discharge .  Normocytic anemia:  - stable  Thrombocytosis: Acute. Platelet count elevated up to 404.  Resolved  Stage 1 pressure injury of sacrum: Present on admission. Wound care as per nursing.  Discharge Condition:  Stable   Follow  UP  Follow-up Information    Heath Lark, MD Follow up.   Specialty: Hematology and Oncology Why: will be called with appointment Contact information: [place] [id number] [phone]        Haywood Pao, MD Follow up in 1 week(s).   Specialty: Internal Medicine Contact information: [address] [place] [address] [phone]             Discharge Instructions  and  Discharge Medications    Discharge Instructions    Discharge instructions   Complete by: As directed    Follow with Primary MD Tisovec, Fransico Him, MD in 7 days   Get CBC, CMP, 2 view Chest X ray checked  by Primary MD next visit.    Activity: As tolerated with Full fall precautions use walker/cane & assistance as needed   Disposition Home    Diet: Heart Healthy  , with feeding assistance and aspiration precautions.  For Heart failure patients - Check your Weight same time everyday, if you gain over 2 pounds, or you develop in leg swelling, experience more shortness of breath or chest pain, call your Primary MD immediately. Follow Cardiac Low Salt Diet and 1.5 lit/day fluid restriction.   On your next visit with your primary care physician please Get Medicines reviewed and adjusted.   Please request your Prim.MD to go over all Hospital Tests and Procedure/Radiological results at the follow up, please get all Hospital records sent to your Prim MD by signing hospital release before you go home.   If you experience worsening of your admission symptoms, develop shortness of breath, life threatening emergency, suicidal or homicidal thoughts you must seek medical attention immediately by calling 911 or calling your MD immediately  if symptoms less severe.  You Must read complete instructions/literature along with all the possible adverse reactions/side effects for all the Medicines you take and that have been prescribed to you. Take any new Medicines after you  have completely understood and accpet all the possible adverse reactions/side effects.   Do not drive, operating heavy machinery, perform activities at heights, swimming or participation in water activities or provide baby sitting services if your were admitted for syncope or siezures until you have seen by Primary MD or a Neurologist and advised to do so again.  Do not drive when taking Pain medications.  Do not take more than prescribed Pain, Sleep and Anxiety Medications  Special Instructions: If you have smoked or chewed Tobacco  in the last 2 yrs please stop smoking, stop any regular Alcohol  and or any Recreational drug use.  Wear Seat belts while driving.   Please note  You were cared for by a hospitalist during your hospital stay. If you have any questions about your discharge medications or the care you received while you were in the hospital after you are discharged, you can call the unit and asked to speak with the hospitalist on call if the hospitalist that took care of you is not available. Once you are discharged, your primary care physician will handle any further medical issues. Please note that NO REFILLS for any discharge medications will be authorized once you are discharged, as it is imperative that you return to your primary care physician (or establish a relationship with a primary care physician if you do not have one) for your aftercare needs so that they can reassess your need for medications and monitor your lab values.   Increase activity slowly   Complete by: As directed      Allergies as of 04/10/2019      Reactions   Penicillins Rash, Other (See Comments)   PATIENT HAS HAD A PCN REACTION WITH IMMEDIATE RASH, FACIAL/TONGUE/THROAT SWELLING, SOB, OR LIGHTHEADEDNESS WITH HYPOTENSION:  #  #  #  YES  #  #  #   Has patient had a PCN reaction causing severe rash involving mucus membranes or skin necrosis: No Has patient had a PCN reaction that required  hospitalization: No Has patient had a PCN reaction occurring within the last 10 years: No If all of the above answers are "NO", then may proceed with Cephalosporin use.   Codeine Nausea Only   Tramadol Hcl Other (See Comments)   "Made me crazy"---caused mood changes      Medication List    STOP taking these medications   amLODipine 5 MG tablet Commonly known as: NORVASC   carvedilol 3.125 MG tablet Commonly known as: COREG   losartan 100 MG tablet Commonly known as: COZAAR   naproxen sodium 220 MG tablet Commonly known as: ALEVE     TAKE these medications   albuterol 108 (90 Base) MCG/ACT inhaler Commonly known as: VENTOLIN HFA Inhale 3 puffs into the lungs 2 (two) times daily.   aspirin EC 81 MG tablet Take 1 tablet (81 mg total) by mouth daily.   calcium-vitamin D 500-200 MG-UNIT tablet Commonly known as: OSCAL WITH D Take 1 tablet by mouth daily.   COSAMIN DS PO Take 1 tablet by mouth daily.   docusate sodium 100 MG capsule Commonly known as: COLACE Take 100 mg by mouth at bedtime.   Ferrex 150 150 MG capsule Generic drug: iron polysaccharides Take 150 mg by mouth daily.   furosemide 20 MG tablet Commonly known as: LASIX Take 1 tablet (20 mg total) by mouth daily as needed for edema. What changed: when to take this   GINKGO BILOBA PO Take 1 tablet by mouth daily.   magnesium 30 MG tablet Take 30 mg by mouth daily.   multivitamin with minerals Tabs tablet Take 1 tablet by mouth daily. Centrum Silver   pantoprazole 40 MG tablet Commonly known as: PROTONIX Take 1 tablet (40 mg total) by mouth daily.   Systane Ultra 0.4-0.3 % Soln Generic drug: Polyethyl Glycol-Propyl Glycol Place 1-2 drops into both eyes 4 (four) times  daily as needed (for dry eyes).   Vitamin D3 50 MCG (2000 UT) Tabs Take 2,000 Units by mouth daily.         Diet and Activity recommendation: See Discharge Instructions above   Consults obtained -  D/W oncology via  phone   Major procedures and Radiology Reports - PLEASE review detailed and final reports for all details, in brief -    Paracentesis  Thoracentesis   CT ABDOMEN PELVIS WO CONTRAST  Result Date: 04/07/2019 CLINICAL DATA:  Ascites with malignant cells on pathology. Suspicion for gyn neoplasm. EXAM: CT ABDOMEN AND PELVIS WITHOUT CONTRAST TECHNIQUE: Multidetector CT imaging of the abdomen and pelvis was performed following the standard protocol without IV contrast. COMPARISON:  03/13/2012 FINDINGS: Lower chest: Large right pleural effusion with right lower lobe collapse/consolidation. Small left pleural effusion with left lower lobe collapse/consolidation. Hepatobiliary: Subtle nodularity of liver contour raises suspicion for cirrhosis. No gross hepatic mass lesion evident on this noncontrast study. Gallbladder is surgically absent. No intrahepatic or extrahepatic biliary dilation. Pancreas: No focal mass lesion. No dilatation of the main duct. No intraparenchymal cyst. No peripancreatic edema. Spleen: No splenomegaly. No focal mass lesion. Adrenals/Urinary Tract: No adrenal nodule or mass. 11 mm exophytic cyst noted interpolar right kidney. Left kidney unremarkable. No evidence for hydroureter. The urinary bladder appears normal for the degree of distention. Stomach/Bowel: Moderate hiatal hernia. Stomach nondistended. Large duodenal diverticulum evident. No small bowel wall thickening. No small bowel dilatation. The terminal ileum is normal. Appendix is not discretely evident. No gross colonic mass. No colonic wall thickening. Diverticular changes are noted in the left colon without evidence of diverticulitis. Vascular/Lymphatic: There is abdominal aortic atherosclerosis without aneurysm. No retroperitoneal lymphadenopathy. No pelvic sidewall lymphadenopathy. Reproductive: Uterus surgically absent. No definite adnexal mass although lack of intravenous contrast and ascites hinders assessment. Other:  Moderate volume ascites noted with fluid collection anterior upper abdomen appearing loculated on the right. Omental nodularity noted inferiorly at the midline with somewhat confluent omental or mesenteric soft tissue measuring 5.6 x 1.2 cm on image 54/3. Possible confluent omental soft tissue in the hepatic flexure on 47/3. Musculoskeletal: Diffuse body wall edema. T12 compression fracture stable back to 03/13/2012. Bones are diffusely demineralized. IMPRESSION: 1. Large right and small left pleural effusions with bilateral lower lobe collapse/consolidation. 2. Moderate volume ascites with probable components of loculated fluid in the anterior upper abdomen. 3. Suspicion for nodular omental soft tissue although assessment is limited by lack of intravenous contrast. 4. No definite adnexal mass. 5. Subtle nodularity of the liver contour raises suspicion for cirrhosis. 6. Diffuse body wall edema. 7. Moderate hiatal hernia. 8. T12 compression fracture, stable back to 03/13/2012. 9. Aortic Atherosclerosis (ICD10-I70.0). Electronically Signed   By: Misty Stanley M.D.   On: 04/07/2019 18:18   DG Chest 1 View  Result Date: 04/08/2019 CLINICAL DATA:  Status post right-sided thoracentesis. EXAM: CHEST  1 VIEW COMPARISON:  04/03/2019 FINDINGS: There is a persistent moderate size right-sided pleural effusion with adjacent airspace disease. There is a small left-sided pleural effusion. There is no pneumothorax. Heart size remains stable. Aortic calcifications are noted. There is no acute osseous abnormality. IMPRESSION: 1. No pneumothorax status post right-sided thoracentesis. 2. Persistent moderate-sized right-sided pleural effusion with adjacent airspace disease favored to represent compressive atelectasis. 3. Persistent small left-sided pleural effusion. Electronically Signed   By: Constance Holster M.D.   On: 04/08/2019 16:23   DG Abd 1 View  Result Date: 04/04/2019 CLINICAL DATA:  Abdominal distension EXAM:  ABDOMEN - 1 VIEW COMPARISON:  None. FINDINGS: The bowel gas pattern is normal. No radio-opaque calculi or other significant radiographic abnormality are seen. IMPRESSION: Negative. Electronically Signed   By: Ulyses Jarred M.D.   On: 04/04/2019 19:29   US RENAL  Result Date: 04/04/2019 CLINICAL DATA:  Acute renal failure EXAM: RENAL / URINARY TRACT ULTRASOUND COMPLETE COMPARISON:  Abdominal ultrasound dated 03/14/2012 FINDINGS: Right Kidney: Renal measurements: 9.1 x 4.0 x 5.4 cm = volume: 102 mL. Renal cortical thinning and increased echogenicity are consistent with chronic kidney disease. A 1.5 cm cyst is seen. No solid mass or hydronephrosis visualized. Left Kidney: Renal measurements: 9.3 x 4.6 x 4.2 cm = volume: 94 mL. Renal cortical thinning and increased echogenicity are consistent with chronic kidney disease. No mass or hydronephrosis visualized. Bladder: Not visualized. Other: Ascites and a right pleural effusion are noted. IMPRESSION: 1. Findings consistent with chronic kidney disease. No hydronephrosis. 2. Abdominal ascites. 3. Right pleural effusion. Electronically Signed   By: Zerita Boers M.D.   On: 04/04/2019 16:43   DG Chest Port 1 View  Result Date: 04/03/2019 CLINICAL DATA:  Acute kidney injury EXAM: PORTABLE CHEST 1 VIEW COMPARISON:  03/17/2012 FINDINGS: Hazy opacity at the right base. Cardiomegaly and low lung volumes. No edema or pneumothorax. IMPRESSION: Increased hazy opacity at the right base at least partially from pleural fluid. The underlying obscured lung could be atelectatic or consolidated. Electronically Signed   By: Monte Fantasia M.D.   On: 04/03/2019 06:15   ECHOCARDIOGRAM COMPLETE  Result Date: 04/04/2019    ECHOCARDIOGRAM REPORT   Patient Name:   MYREE FUCHS Date of Exam: 04/04/2019 Medical Rec #:  [id number]           Height:       60.0 in Accession #:    [id number]          Weight:       147.9 lb Date of Birth:  11/05/1922            BSA:          1.642 m  Patient Age:    46 years            BP:           134/64 mmHg Patient Gender: F                   HR:           68 bpm. Exam Location:  Inpatient Procedure: 2D Echo Indications:    CHF-Acute Diastolic [id number]  History:        Patient has prior history of Echocardiogram examinations, most                 recent 08/30/2012. Risk Factors:Hypertension.  Sonographer:    Mikki Santee RDCS (AE) Referring Phys: [id number] RONDELL A SMITH IMPRESSIONS  1. Left ventricular ejection fraction, by estimation, is 70 to 75%. The left ventricle has hyperdynamic function. The left ventricle has no regional wall motion abnormalities. There is mild left ventricular hypertrophy. Left ventricular diastolic parameters are consistent with Grade I diastolic dysfunction (impaired relaxation).  2. Right ventricular systolic function is normal. The right ventricular size is normal. There is moderately elevated pulmonary artery systolic pressure. The estimated right ventricular systolic pressure is [id number] mmHg.  3. Left atrial size was mildly dilated.  4. The mitral valve is degenerative. Mild mitral valve regurgitation.  5. The aortic valve is tricuspid. Aortic valve regurgitation is  not visualized. Moderate aortic valve stenosis. Aortic valve mean gradient measures 20.5 mmHg. Aortic valve Vmax measures 3.24 m/s.  6. The inferior vena cava is normal in size with greater than 50% respiratory variability, suggesting right atrial pressure of 3 mmHg. FINDINGS  Left Ventricle: Left ventricular ejection fraction, by estimation, is 70 to 75%. The left ventricle has hyperdynamic function. The left ventricle has no regional wall motion abnormalities. The left ventricular internal cavity size was normal in size. There is mild left ventricular hypertrophy. Left ventricular diastolic parameters are consistent with Grade I diastolic dysfunction (impaired relaxation). Right Ventricle: The right ventricular size is normal. No increase in right ventricular wall  thickness. Right ventricular systolic function is normal. There is moderately elevated pulmonary artery systolic pressure. The tricuspid regurgitant velocity is 3.09 m/s, and with an assumed right atrial pressure of 3 mmHg, the estimated right ventricular systolic pressure is [id number] mmHg. Left Atrium: Left atrial size was mildly dilated. Right Atrium: Right atrial size was normal in size. Pericardium: There is no evidence of pericardial effusion. Presence of pericardial fat pad. Mitral Valve: The mitral valve is degenerative in appearance. There is mild thickening of the mitral valve leaflet(s). Moderate mitral annular calcification. Mild mitral valve regurgitation. Tricuspid Valve: The tricuspid valve is grossly normal. Tricuspid valve regurgitation is mild. Aortic Valve: The aortic valve is tricuspid. Aortic valve regurgitation is not visualized. Moderate aortic stenosis is present. Mild aortic valve annular calcification. Aortic valve mean gradient measures 20.5 mmHg. Aortic valve peak gradient measures 42.0 mmHg. Aortic valve area, by VTI measures 0.63 cm. Pulmonic Valve: The pulmonic valve was grossly normal. Pulmonic valve regurgitation is not visualized. Aorta: The aortic root is normal in size and structure. Venous: The inferior vena cava is normal in size with greater than 50% respiratory variability, suggesting right atrial pressure of 3 mmHg. IAS/Shunts: No atrial level shunt detected by color flow Doppler.  LEFT VENTRICLE PLAX 2D LVIDd:         3.80 cm  Diastology LVIDs:         2.10 cm  LV e' lateral:   5.98 cm/s LV PW:         1.10 cm  LV E/e' lateral: 17.7 LV IVS:        1.20 cm  LV e' medial:    7.18 cm/s LVOT diam:     1.60 cm  LV E/e' medial:  14.8 LV SV:         43.63 ml LV SV Index:   26.57 LVOT Area:     2.01 cm  RIGHT VENTRICLE RV S prime:     12.70 cm/s TAPSE (M-mode): 1.8 cm LEFT ATRIUM           Index       RIGHT ATRIUM           Index LA diam:      3.60 cm 2.19 cm/m  RA Area:     11.30 cm  LA Vol (A2C): 36.7 ml 22.35 ml/m RA Volume:   25.80 ml  15.71 ml/m LA Vol (A4C): 77.6 ml 47.26 ml/m  AORTIC VALVE AV Area (Vmax):    0.65 cm AV Area (Vmean):   0.64 cm AV Area (VTI):     0.63 cm AV Vmax:           324.00 cm/s AV Vmean:          213.500 cm/s AV VTI:            0.694  m AV Peak Grad:      42.0 mmHg AV Mean Grad:      20.5 mmHg LVOT Vmax:         104.00 cm/s LVOT Vmean:        67.500 cm/s LVOT VTI:          0.217 m LVOT/AV VTI ratio: 0.31  AORTA Ao Root diam: 3.10 cm MITRAL VALVE                TRICUSPID VALVE MV Area (PHT): 2.56 cm     TR Peak grad:   38.2 mmHg MV Decel Time: 296 msec     TR Vmax:        309.00 cm/s MV E velocity: 106.00 cm/s MV A velocity: 165.00 cm/s  SHUNTS MV E/A ratio:  0.64         Systemic VTI:  0.22 m                             Systemic Diam: 1.60 cm Rozann Lesches MD Electronically signed by Rozann Lesches MD Signature Date/Time: 04/04/2019/11:34:53 AM    Final    US LIVER DOPPLER  Result Date: 04/06/2019 CLINICAL DATA:  Ascites. EXAM: DUPLEX ULTRASOUND OF LIVER TECHNIQUE: Color and duplex Doppler ultrasound was performed to evaluate the hepatic in-flow and out-flow vessels. COMPARISON:  None. FINDINGS: Liver: Increased echogenicity of hepatic parenchyma is noted suggesting diffuse hepatocellular disease. Normal hepatic contour without nodularity. No focal lesion, mass or intrahepatic biliary ductal dilatation. Main Portal Vein size: 1.2 cm Portal Vein Velocities Main Prox:  29 cm/sec Main Mid: 42.2 cm/sec Main Dist:  37.3 cm/sec Right: 37.8 cm/sec Left: 22.3 cm/sec Normal hepatopetal flow is noted in the portal veins. Hepatic Vein Velocities Right:  65 cm/sec Middle:  58.9 cm/sec Left:  31 cm/sec Normal hepatofugal flow is noted in the hepatic veins. IVC: Present and patent with normal respiratory phasicity. Hepatic Artery Velocity:  329 cm/sec Splenic Vein Velocity:  43 cm/sec Spleen: 5.0 cm x 9.9 cm x 3.6 cm with a total volume of 92 cm^3 (411 cm^3 is upper  limit normal) Portal Vein Occlusion/Thrombus: No Splenic Vein Occlusion/Thrombus: No Ascites: Mild ascites is noted. Varices: None Bilateral pleural effusions are noted. IMPRESSION: No Doppler evidence of portal, hepatic or splenic venous thrombosis or occlusion. Increased echogenicity of hepatic parenchyma is noted suggesting diffuse hepatocellular disease. Mild ascites. Electronically Signed   By: Marijo Conception M.D.   On: 04/06/2019 10:50   VAS [place] LOWER EXTREMITY VENOUS (DVT)  Result Date: 04/04/2019  Lower Venous DVTStudy Indications: Edema.  Comparison Study: No prior study on file for comparison Performing Technologist: Sharion Dove RVS  Examination Guidelines: A complete evaluation includes B-mode imaging, spectral Doppler, color Doppler, and power Doppler as needed of all accessible portions of each vessel. Bilateral testing is considered an integral part of a complete examination. Limited examinations for reoccurring indications may be performed as noted. The reflux portion of the exam is performed with the patient in reverse Trendelenburg.  +---------+---------------+---------+-----------+----------+-------------------+ RIGHT    CompressibilityPhasicitySpontaneityPropertiesThrombus Aging      +---------+---------------+---------+-----------+----------+-------------------+ CFV      Full           Yes      Yes                                      +---------+---------------+---------+-----------+----------+-------------------+  SFJ      Full                                                             +---------+---------------+---------+-----------+----------+-------------------+ FV Prox  Full                                                             +---------+---------------+---------+-----------+----------+-------------------+ FV Mid   Full                                                              +---------+---------------+---------+-----------+----------+-------------------+ FV Distal               Yes      Yes                  patent by color and                                                       Doppler             +---------+---------------+---------+-----------+----------+-------------------+ PFV      Full                                                             +---------+---------------+---------+-----------+----------+-------------------+ POP      Full           Yes      Yes                                      +---------+---------------+---------+-----------+----------+-------------------+ PTV      Full                                                             +---------+---------------+---------+-----------+----------+-------------------+ PERO     Full                                                             +---------+---------------+---------+-----------+----------+-------------------+   +---------+---------------+---------+-----------+----------+--------------+ LEFT     CompressibilityPhasicitySpontaneityPropertiesThrombus Aging +---------+---------------+---------+-----------+----------+--------------+ CFV      Full  Yes      Yes                                 +---------+---------------+---------+-----------+----------+--------------+ SFJ      Full                                                        +---------+---------------+---------+-----------+----------+--------------+ FV Prox  Full                                                        +---------+---------------+---------+-----------+----------+--------------+ FV Mid   Full                                                        +---------+---------------+---------+-----------+----------+--------------+ FV DistalFull                                                         +---------+---------------+---------+-----------+----------+--------------+ PFV      Full                                                        +---------+---------------+---------+-----------+----------+--------------+ POP      Full           Yes      Yes                                 +---------+---------------+---------+-----------+----------+--------------+ PTV      Full                                                        +---------+---------------+---------+-----------+----------+--------------+ PERO     Full                                                        +---------+---------------+---------+-----------+----------+--------------+     Summary: BILATERAL: - No evidence of deep vein thrombosis seen in the lower extremities, bilaterally.   *See table(s) above for measurements and observations. Electronically signed by Deitra Mayo MD on 04/04/2019 at 2:16:00 PM.    Final    US Abdomen Limited RUQ  Result Date: 04/06/2019 CLINICAL DATA:  Ascites. EXAM: ULTRASOUND ABDOMEN LIMITED RIGHT UPPER QUADRANT COMPARISON:  March 14, 2012. FINDINGS: Gallbladder:  Status post cholecystectomy. Common bile duct: Diameter: 7 mm which is within normal limits. Liver: Increased echogenicity of hepatic parenchyma is noted suggesting diffuse hepatocellular disease. No definite focal abnormality is seen at this time. Portal vein is patent on color Doppler imaging with normal direction of blood flow towards the liver. Other: Mild ascites is noted. IMPRESSION: Status post cholecystectomy. Increased echogenicity of hepatic parenchyma is noted suggesting diffuse hepatocellular disease. Mild ascites. Electronically Signed   By: Marijo Conception M.D.   On: 04/06/2019 10:45   IR Paracentesis  Result Date: 04/05/2019 INDICATION: Acute on chronic kidney disease. Ascites. Request for diagnostic and therapeutic paracentesis. EXAM: ULTRASOUND GUIDED PARACENTESIS MEDICATIONS: 1% lidocaine 10 mL  COMPLICATIONS: None immediate. PROCEDURE: Informed written consent was obtained from the patient after a discussion of the risks, benefits and alternatives to treatment. A timeout was performed prior to the initiation of the procedure. Initial ultrasound scanning demonstrates a large amount of ascites within the right lower abdominal quadrant. The right lower abdomen was prepped and draped in the usual sterile fashion. 1% lidocaine was used for local anesthesia. Following this, a 19 gauge, 7-cm, Yueh catheter was introduced. An ultrasound image was saved for documentation purposes. The paracentesis was performed. The catheter was removed and a dressing was applied. The patient tolerated the procedure well without immediate post procedural complication. FINDINGS: A total of approximately 5.2 L of clear yellow fluid was removed. Samples were sent to the laboratory as requested by the clinical team. IMPRESSION: Successful ultrasound-guided paracentesis yielding 5.2 liters of peritoneal fluid. Read by: Gareth Eagle, PA-C Electronically Signed   By: Corrie Mckusick D.O.   On: 04/05/2019 14:11   IR THORACENTESIS ASP PLEURAL SPACE W/IMG GUIDE  Result Date: 04/08/2019 INDICATION: Patient with history of malignant ascites of suspicious GYN origin, now with right pleural effusion. Request is made for right thoracentesis. EXAM: ULTRASOUND GUIDED DIAGNOSTIC AND THERAPEUTIC RIGHT THORACENTESIS MEDICATIONS: 10 mL 1% lidocaine COMPLICATIONS: None immediate. PROCEDURE: An ultrasound guided thoracentesis was thoroughly discussed with the patient and questions answered. The benefits, risks, alternatives and complications were also discussed. The patient understands and wishes to proceed with the procedure. Written consent was obtained. Ultrasound was performed to localize and mark an adequate pocket of fluid in the right chest. The area was then prepped and draped in the normal sterile fashion. 1% Lidocaine was used for local  anesthesia. Under ultrasound guidance a 6 Fr Safe-T-Centesis catheter was introduced. Thoracentesis was performed. The catheter was removed and a dressing applied. FINDINGS: A total of approximately 750 mL of clear, yellow fluid was removed. Samples were sent to the laboratory as requested by the clinical team. IMPRESSION: Successful ultrasound guided diagnostic and therapeutic right thoracentesis yielding 750 mL of pleural fluid. Read by: Brynda Greathouse PA-C Electronically Signed   By: Corrie Mckusick D.O.   On: 04/08/2019 16:55    Micro Results     Recent Results (from the past 240 hour(s))  SARS CORONAVIRUS 2 (TAT 6-24 HRS) Nasopharyngeal Nasopharyngeal Swab     Status: None   Collection Time: 04/03/19  5:56 AM   Specimen: Nasopharyngeal Swab  Result Value Ref Range Status   SARS Coronavirus 2 NEGATIVE NEGATIVE Final    Comment: (NOTE) SARS-CoV-2 target nucleic acids are NOT DETECTED. The SARS-CoV-2 RNA is generally detectable in upper and lower respiratory specimens during the acute phase of infection. Negative results do not preclude SARS-CoV-2 infection, do not rule out co-infections with other pathogens, and should not be used as the  sole basis for treatment or other patient management decisions. Negative results must be combined with clinical observations, patient history, and epidemiological information. The expected result is Negative. Fact Sheet for Patients: SugarRoll.be Fact Sheet for Healthcare Providers: https://www.woods-mathews.com/ This test is not yet approved or cleared by the [place] FDA and  has been authorized for detection and/or diagnosis of SARS-CoV-2 by FDA under an Emergency Use Authorization (EUA). This EUA will remain  in effect (meaning this test can be used) for the duration of the COVID-19 declaration under Section 56 4(b)(1) of the Act, 21 U.S.C. section 360bbb-3(b)(1), unless the authorization is terminated  or revoked sooner. Performed at [facility], [place] [address] [place], [place] [address]   Culture, body fluid-bottle     Status: None   Collection Time: 04/05/19  2:14 PM   Specimen: Peritoneal Washings  Result Value Ref Range Status   Specimen Description PERITONEAL  Final   Special Requests NONE  Final   Culture   Final    NO GROWTH 5 DAYS Performed at [facility], [address], [place], [place] [address]    Report Status 04/10/2019 FINAL  Final  Gram stain     Status: None   Collection Time: 04/05/19  2:14 PM   Specimen: Peritoneal Washings  Result Value Ref Range Status   Specimen Description PERITONEAL  Final   Special Requests NONE  Final   Gram Stain   Final    MODERATE WBC PRESENT, PREDOMINANTLY MONONUCLEAR NO ORGANISMS SEEN Performed at [facility], [place] [address] [place], [place] [address]    Report Status 04/05/2019 FINAL  Final  Gram stain     Status: None   Collection Time: 04/08/19  4:14 PM   Specimen: PATH Cytology Pleural fluid  Result Value Ref Range Status   Specimen Description PLEURAL  Final   Special Requests NONE  Final   Gram Stain   Final    MODERATE WBC PRESENT, PREDOMINANTLY MONONUCLEAR NO ORGANISMS SEEN Performed at [facility], [place] [address] [place], [place] [address]    Report Status 04/08/2019 FINAL  Final  Culture, body fluid-bottle     Status: None (Preliminary result)   Collection Time: 04/08/19  4:14 PM   Specimen: Pleura  Result Value Ref Range Status   Specimen Description PLEURAL  Final   Special Requests NONE  Final   Culture   Final    NO GROWTH 2 DAYS Performed at [place] [address], [place], [place] [address]    Report Status PENDING  Incomplete       Today   Subjective:   Aeon Tofte today has no headache,no chest or abdominal pain, ports she is feeling better today, reports she has a good appetite, but she does not like the hospital food .  Objective:    Blood pressure (!) 124/51, pulse 71, temperature 97.8 F (36.6 C), temperature source Oral, resp. rate 16, height 5' (1.524 m), weight 65 kg, SpO2 91 %.   Intake/Output Summary (Last 24 hours) at 04/10/2019 1142 Last data filed at 04/09/2019 2020 Gross per 24 hour  Intake --  Output 500 ml  Net -500 ml    Exam Awake Alert, Oriented x 3, No new F.N deficits, Normal affect Symmetrical Chest wall movement, diminishd air entry at the bases  RRR,No Gallops,Rubs or new Murmurs, No Parasternal Heave +ve B.Sounds, Abd Soft, Non tender, , No rebound -guarding or rigidity. No Cyanosis,  Clubbing or edema, No new Rash or bruise  Data Review   CBC w Diff:  Lab Results  Component Value Date   WBC 10.6 (H) 04/09/2019   HGB 9.6 (L) 04/09/2019   HCT 30.6 (L) 04/09/2019   HCT 27.2 (L) 04/04/2019   PLT 310 04/09/2019   LYMPHOPCT 20 04/07/2019   MONOPCT 13 04/07/2019   EOSPCT 1 04/07/2019   BASOPCT 1 04/07/2019    CMP:  Lab Results  Component Value Date   NA 139 04/09/2019   NA 139 04/02/2019   K 5.0 04/09/2019   CL 109 04/09/2019   CO2 23 04/09/2019   BUN 35 (H) 04/09/2019   BUN 52 (H) 04/02/2019   CREATININE 1.38 (H) 04/09/2019   PROT 4.9 (L) 04/06/2019   PROT 6.5 04/02/2019   ALBUMIN 2.7 (L) 04/06/2019   ALBUMIN 3.5 04/02/2019   BILITOT 0.6 04/06/2019   BILITOT 0.3 04/02/2019   ALKPHOS 49 04/06/2019   AST 16 04/06/2019   ALT 8 04/06/2019  .   Total Time in preparing paper work, data evaluation and todays exam - 85 minutes  Phillips Climes M.D on 04/10/2019 at 11:42 AM  [facility]   Office  [phone]

## 2019-04-10 NOTE — Progress Notes (Signed)
Nsg Discharge Note  Admit Date:  04/03/2019 Discharge date: 04/10/2019   Mallory Jensen to be D/C'd home per MD order.  AVS completed. Patient/caregiver able to verbalize understanding.  Discharge Medication: Allergies as of 04/10/2019      Reactions   Penicillins Rash, Other (See Comments)   PATIENT HAS HAD A PCN REACTION WITH IMMEDIATE RASH, FACIAL/TONGUE/THROAT SWELLING, SOB, OR LIGHTHEADEDNESS WITH HYPOTENSION:  #  #  #  YES  #  #  #   Has patient had a PCN reaction causing severe rash involving mucus membranes or skin necrosis: No Has patient had a PCN reaction that required hospitalization: No Has patient had a PCN reaction occurring within the last 10 years: No If all of the above answers are "NO", then may proceed with Cephalosporin use.   Codeine Nausea Only   Tramadol Hcl Other (See Comments)   "Made me crazy"---caused mood changes      Medication List    STOP taking these medications   amLODipine 5 MG tablet Commonly known as: NORVASC   carvedilol 3.125 MG tablet Commonly known as: COREG   losartan 100 MG tablet Commonly known as: COZAAR   naproxen sodium 220 MG tablet Commonly known as: ALEVE     TAKE these medications   albuterol 108 (90 Base) MCG/ACT inhaler Commonly known as: VENTOLIN HFA Inhale 3 puffs into the lungs 2 (two) times daily.   aspirin EC 81 MG tablet Take 1 tablet (81 mg total) by mouth daily.   calcium-vitamin D 500-200 MG-UNIT tablet Commonly known as: OSCAL WITH D Take 1 tablet by mouth daily.   COSAMIN DS PO Take 1 tablet by mouth daily.   docusate sodium 100 MG capsule Commonly known as: COLACE Take 100 mg by mouth at bedtime.   Ferrex 150 150 MG capsule Generic drug: iron polysaccharides Take 150 mg by mouth daily.   furosemide 20 MG tablet Commonly known as: LASIX Take 1 tablet (20 mg total) by mouth daily as needed for edema. What changed: when to take this   GINKGO BILOBA PO Take 1 tablet by mouth daily.    magnesium 30 MG tablet Take 30 mg by mouth daily.   multivitamin with minerals Tabs tablet Take 1 tablet by mouth daily. Centrum Silver   pantoprazole 40 MG tablet Commonly known as: PROTONIX Take 1 tablet (40 mg total) by mouth daily.   Systane Ultra 0.4-0.3 % Soln Generic drug: Polyethyl Glycol-Propyl Glycol Place 1-2 drops into both eyes 4 (four) times daily as needed (for dry eyes).   Vitamin D3 50 MCG (2000 UT) Tabs Take 2,000 Units by mouth daily.       Discharge Assessment: Vitals:   04/10/19 0805 04/10/19 1040  BP: (!) 133/49 (!) 124/51  Pulse: 74 71  Resp: 16 16  Temp: 97.6 F (36.4 C) 97.8 F (36.6 C)  SpO2: 92% 91%   Skin clean, dry and intact without evidence of skin break down, no evidence of skin tears noted. IV catheter discontinued intact. Site without signs and symptoms of complications - no redness or edema noted at insertion site, patient denies c/o pain - only slight tenderness at site.  Dressing with slight pressure applied.  D/c Instructions-Education: Discharge instructions given to patient/family with verbalized understanding. D/c education completed with patient/family including follow up instructions, medication list, d/c activities limitations if indicated, with other d/c instructions as indicated by MD - patient able to verbalize understanding, all questions fully answered. Patient instructed to return to  ED, call 911, or call MD for any changes in condition.  Patient escorted via [place], and D/C home via private auto.  Atilano Ina, RN 04/10/2019 1:42 PM

## 2019-04-11 DIAGNOSIS — E02 Subclinical iodine-deficiency hypothyroidism: Secondary | ICD-10-CM | POA: Diagnosis not present

## 2019-04-11 DIAGNOSIS — I0981 Rheumatic heart failure: Secondary | ICD-10-CM | POA: Diagnosis not present

## 2019-04-11 DIAGNOSIS — D630 Anemia in neoplastic disease: Secondary | ICD-10-CM | POA: Diagnosis not present

## 2019-04-11 DIAGNOSIS — I700 Atherosclerosis of aorta: Secondary | ICD-10-CM | POA: Diagnosis not present

## 2019-04-11 DIAGNOSIS — I440 Atrioventricular block, first degree: Secondary | ICD-10-CM | POA: Diagnosis not present

## 2019-04-11 DIAGNOSIS — N281 Cyst of kidney, acquired: Secondary | ICD-10-CM | POA: Diagnosis not present

## 2019-04-11 DIAGNOSIS — D631 Anemia in chronic kidney disease: Secondary | ICD-10-CM | POA: Diagnosis not present

## 2019-04-11 DIAGNOSIS — G8929 Other chronic pain: Secondary | ICD-10-CM | POA: Diagnosis not present

## 2019-04-11 DIAGNOSIS — I083 Combined rheumatic disorders of mitral, aortic and tricuspid valves: Secondary | ICD-10-CM | POA: Diagnosis not present

## 2019-04-11 DIAGNOSIS — M549 Dorsalgia, unspecified: Secondary | ICD-10-CM | POA: Diagnosis not present

## 2019-04-11 DIAGNOSIS — I4510 Unspecified right bundle-branch block: Secondary | ICD-10-CM | POA: Diagnosis not present

## 2019-04-11 DIAGNOSIS — E0781 Sick-euthyroid syndrome: Secondary | ICD-10-CM | POA: Diagnosis not present

## 2019-04-11 DIAGNOSIS — R180 Malignant ascites: Secondary | ICD-10-CM | POA: Diagnosis not present

## 2019-04-11 DIAGNOSIS — K449 Diaphragmatic hernia without obstruction or gangrene: Secondary | ICD-10-CM | POA: Diagnosis not present

## 2019-04-11 DIAGNOSIS — C569 Malignant neoplasm of unspecified ovary: Secondary | ICD-10-CM | POA: Diagnosis not present

## 2019-04-11 DIAGNOSIS — J910 Malignant pleural effusion: Secondary | ICD-10-CM | POA: Diagnosis not present

## 2019-04-11 DIAGNOSIS — N1832 Chronic kidney disease, stage 3b: Secondary | ICD-10-CM | POA: Diagnosis not present

## 2019-04-11 DIAGNOSIS — K5750 Diverticulosis of both small and large intestine without perforation or abscess without bleeding: Secondary | ICD-10-CM | POA: Diagnosis not present

## 2019-04-11 DIAGNOSIS — I130 Hypertensive heart and chronic kidney disease with heart failure and stage 1 through stage 4 chronic kidney disease, or unspecified chronic kidney disease: Secondary | ICD-10-CM | POA: Diagnosis not present

## 2019-04-11 DIAGNOSIS — K859 Acute pancreatitis without necrosis or infection, unspecified: Secondary | ICD-10-CM | POA: Diagnosis not present

## 2019-04-11 DIAGNOSIS — J9811 Atelectasis: Secondary | ICD-10-CM | POA: Diagnosis not present

## 2019-04-11 DIAGNOSIS — N179 Acute kidney failure, unspecified: Secondary | ICD-10-CM | POA: Diagnosis not present

## 2019-04-11 DIAGNOSIS — I5033 Acute on chronic diastolic (congestive) heart failure: Secondary | ICD-10-CM | POA: Diagnosis not present

## 2019-04-11 DIAGNOSIS — M810 Age-related osteoporosis without current pathological fracture: Secondary | ICD-10-CM | POA: Diagnosis not present

## 2019-04-11 DIAGNOSIS — E785 Hyperlipidemia, unspecified: Secondary | ICD-10-CM | POA: Diagnosis not present

## 2019-04-12 ENCOUNTER — Telehealth: Payer: Self-pay

## 2019-04-12 ENCOUNTER — Encounter: Payer: Self-pay | Admitting: Hematology and Oncology

## 2019-04-12 DIAGNOSIS — C482 Malignant neoplasm of peritoneum, unspecified: Secondary | ICD-10-CM | POA: Insufficient documentation

## 2019-04-12 NOTE — Telephone Encounter (Signed)
-----   Message from Heath Lark, MD sent at 04/12/2019  7:29 AM EST ----- Regarding: New GYN patient Hi [name],  Can you call and ask if she can come in Thursday for appt at 1230 pm? 1 hours appt. I recommend her to bring family with her Thanks

## 2019-04-12 NOTE — Telephone Encounter (Signed)
Called and spoke with patient. She asked me to call daughter with appt details. Spoke with [name] and given below message. She is agreeable to appt. Instructed to arrive at 1215 for appt. Appt scheduled.

## 2019-04-13 ENCOUNTER — Other Ambulatory Visit: Payer: Self-pay

## 2019-04-13 DIAGNOSIS — D631 Anemia in chronic kidney disease: Secondary | ICD-10-CM | POA: Diagnosis not present

## 2019-04-13 DIAGNOSIS — N1832 Chronic kidney disease, stage 3b: Secondary | ICD-10-CM | POA: Diagnosis not present

## 2019-04-13 DIAGNOSIS — I5033 Acute on chronic diastolic (congestive) heart failure: Secondary | ICD-10-CM | POA: Diagnosis not present

## 2019-04-13 DIAGNOSIS — I083 Combined rheumatic disorders of mitral, aortic and tricuspid valves: Secondary | ICD-10-CM | POA: Diagnosis not present

## 2019-04-13 DIAGNOSIS — I0981 Rheumatic heart failure: Secondary | ICD-10-CM | POA: Diagnosis not present

## 2019-04-13 DIAGNOSIS — I130 Hypertensive heart and chronic kidney disease with heart failure and stage 1 through stage 4 chronic kidney disease, or unspecified chronic kidney disease: Secondary | ICD-10-CM | POA: Diagnosis not present

## 2019-04-13 LAB — CULTURE, BODY FLUID W GRAM STAIN -BOTTLE: Culture: NO GROWTH

## 2019-04-13 NOTE — Consult Note (Signed)
Care Coordination information:  Chart reviewed for disposition for home.  Patient had been previously active with [facility], and consulted for [facility] program prior to admission.  Patient was set up with [facility] with the [facility] program transitioning back home.  Spoke with all 3 agencies to assist with care coordination.  Currently, [facility] is to home visit next week as set up with patient's daughter. Confirmed with Tommi Rumps with [facility] that nurse aide, RN, and PT has started with the [facility] program.  For questions, please contact:  Natividad Brood, RN BSN [facility] Liaison  [phone] business mobile phone Toll free office [phone]  Fax number: [phone] [email or web address]

## 2019-04-14 ENCOUNTER — Other Ambulatory Visit: Payer: Self-pay | Admitting: *Deleted

## 2019-04-14 ENCOUNTER — Encounter: Payer: Self-pay | Admitting: *Deleted

## 2019-04-14 DIAGNOSIS — I5032 Chronic diastolic (congestive) heart failure: Secondary | ICD-10-CM | POA: Diagnosis not present

## 2019-04-14 DIAGNOSIS — E0781 Sick-euthyroid syndrome: Secondary | ICD-10-CM | POA: Diagnosis not present

## 2019-04-14 DIAGNOSIS — C569 Malignant neoplasm of unspecified ovary: Secondary | ICD-10-CM | POA: Diagnosis not present

## 2019-04-14 DIAGNOSIS — N289 Disorder of kidney and ureter, unspecified: Secondary | ICD-10-CM | POA: Diagnosis not present

## 2019-04-14 NOTE — Patient Outreach (Signed)
[place] [facility]) Care Management [facility] CM Telephone Outreach, [place] Red Alert notification/ General Discharge PCP office completes Transition of Care follow up post-hospital discharge Post-hospital discharge day # 4 Unsuccessful consecutive outreach attempt # 1- new referral (EMMI Red- Alert)  04/14/2019  Tawna Burtnett [facility] [birth year] [id number]  EMMI Red Alert notification/ General discharge EMMI call date/ day # : Monday April 12, 2019; day # 1 Red Alert reason(s): "having other problems/ questions"  Unsuccessful initial telephone outreach attempt to [employer], 86 y/o female referred to [facility] RN CM 04/13/19 by [facility] CMA for EMMI Red Alert notification as above; patient was recently hospitalized February 20-27, 2021 for hyperkalemia associated with AKI, with fatigue and lower extremity swelling; patient was also noted to have pleural effusion which required thoracentesis while hospitalized.  Patient was discharged home to self- care with home health services through [place] program; patient also is active with [facility] through [place].  Patient has history including, but not limited to, HTN/ HLD; CKD; dCHF with AS; anemia; and hypothyroidism.  HIPAA compliant voice mail message left for patient, requesting return call back.  Plan:  [place] unsuccessful patient outreach letter deferred- based on previous [facility] CM care coordination outreach from [place], [place] [facility], notifying that patient is currently active with both [facility] program and [place] team  Will re-attempt [place] telephone outreach within 4 business days if I do not hear back from patient first  Oneta Rack, RN, BSN, [employer] [facility] Care Management  [phone]

## 2019-04-14 NOTE — Patient Outreach (Signed)
[facility] [facility]) Care Management [place] Telephone [facility] Coordination  04/14/2019  Marianela Mirenda [facility] [birth year] [id number]  Received incoming call from [facility] CM [facility] liaison, informing that patient has the following in place post- recent hospital discharge; as noted by [place] in previous care coordination note from 04/13/19:  Care Coordination information:  Chart reviewed for disposition for home.  Patient had been previously active with [facility], and consulted for [facility] program prior to admission.  Patient was set up with [facility] with the [facility] program transitioning back home.  Spoke with all 3 agencies to assist with care coordination.  Currently, [facility] is to home visit next week as set up with patient's daughter. Confirmed with Tommi Rumps with [facility] that nurse aide, RN, and PT has started with the [facility] program.  Plan:  Will complete EMMI red Alert screening call to patient as per referral  Oneta Rack, RN, BSN, [place] Care Management  [phone]

## 2019-04-15 ENCOUNTER — Inpatient Hospital Stay: Payer: Medicare Other | Attending: Hematology and Oncology | Admitting: Hematology and Oncology

## 2019-04-15 ENCOUNTER — Other Ambulatory Visit: Payer: Self-pay

## 2019-04-15 ENCOUNTER — Encounter: Payer: Self-pay | Admitting: Hematology and Oncology

## 2019-04-15 DIAGNOSIS — Z9181 History of falling: Secondary | ICD-10-CM | POA: Diagnosis not present

## 2019-04-15 DIAGNOSIS — R531 Weakness: Secondary | ICD-10-CM | POA: Diagnosis not present

## 2019-04-15 DIAGNOSIS — Z7982 Long term (current) use of aspirin: Secondary | ICD-10-CM | POA: Diagnosis not present

## 2019-04-15 DIAGNOSIS — I2510 Atherosclerotic heart disease of native coronary artery without angina pectoris: Secondary | ICD-10-CM | POA: Insufficient documentation

## 2019-04-15 DIAGNOSIS — I5032 Chronic diastolic (congestive) heart failure: Secondary | ICD-10-CM | POA: Diagnosis not present

## 2019-04-15 DIAGNOSIS — C482 Malignant neoplasm of peritoneum, unspecified: Secondary | ICD-10-CM | POA: Insufficient documentation

## 2019-04-15 DIAGNOSIS — I130 Hypertensive heart and chronic kidney disease with heart failure and stage 1 through stage 4 chronic kidney disease, or unspecified chronic kidney disease: Secondary | ICD-10-CM | POA: Diagnosis not present

## 2019-04-15 DIAGNOSIS — E7800 Pure hypercholesterolemia, unspecified: Secondary | ICD-10-CM | POA: Insufficient documentation

## 2019-04-15 DIAGNOSIS — R180 Malignant ascites: Secondary | ICD-10-CM | POA: Insufficient documentation

## 2019-04-15 DIAGNOSIS — Z7189 Other specified counseling: Secondary | ICD-10-CM | POA: Diagnosis not present

## 2019-04-15 DIAGNOSIS — J90 Pleural effusion, not elsewhere classified: Secondary | ICD-10-CM | POA: Insufficient documentation

## 2019-04-15 DIAGNOSIS — Z79899 Other long term (current) drug therapy: Secondary | ICD-10-CM | POA: Diagnosis not present

## 2019-04-15 NOTE — Assessment & Plan Note (Signed)
She has loud murmur on exam and signs and symptoms with chronic congestive heart failure She will continue medical management as directed by her primary care doctor or cardiologist

## 2019-04-15 NOTE — Assessment & Plan Note (Signed)
She has malignant ascites on exam We discussed the risk and benefits of repeat paracentesis Even though it causes discomfort, I think the risk of paracentesis exceeds the benefit I recommend observation only for now

## 2019-04-15 NOTE — Assessment & Plan Note (Signed)
We have extensive discussions about goals of care The patient is leaning towards palliative care/hospice which I think is appropriate in this situation When she was recently discharged from the hospital, her daughter informed me that the palliative care has contacted her to schedule a home visit She has advanced directive and living will at home  I think it is not unreasonable to consider palliative care/hospice only for symptom management as chemotherapy is unlikely will prolong her life given her significant multiple comorbidities and her frail status Since I will not be prescribing any treatment for her and she has significant comorbidities, I will not be attending on file for her palliative care/hospice consult unless her primary care doctor is unwilling to be the attending on file

## 2019-04-15 NOTE — Assessment & Plan Note (Signed)
Based on her previous history of abnormal CT imaging in 2014 for concern for ovarian cancer and current cytology from recent malignant ascites, overall presentation is highly suspicious for metastatic ovarian cancer causing peritoneal carcinomatosis, ascites and pleural effusion Patient is very weak and frail with multiple comorbidities She has poor performance status We discussed the rationale of treatment She is not a candidate for surgery or radiation Her only option is systemic chemotherapy I did not go into the fine details of palliative chemotherapy as I am concerned about her frail status that chemotherapy could potentially shorten her life before it brings in any benefit of prolonging her life She will go home and think about it and will discuss further with her family  Given her possible diagnosis of ovarian cancer, we also discussed genetic counseling and genetic testing and she will go home and think about it

## 2019-04-15 NOTE — Assessment & Plan Note (Signed)
She has detectable reduced breath sound on the right lung, likely secondary to persistent pleural effusion She have shortness of breath on minimal exertion but maintained excellent oxygen saturation on room air We discussed the risk and benefits of recurrent thoracentesis including risk of infection, bleeding and pneumothorax For now, I recommend observation only If she is interested to enroll in palliative care/hospice, she will qualify for oxygen therapy

## 2019-04-15 NOTE — Progress Notes (Signed)
[place] CONSULT NOTE  Patient Care Team: Tisovec, Fransico Him, MD as PCP - General (Internal Medicine) Martinique, Peter M, MD as PCP - Cardiology (Cardiology) Starling Manns, Teresa Pelton, MD as Referring Physician (Ophthalmology) Knox Royalty, RN as [place] Management  ASSESSMENT & PLAN:  Primary peritoneal carcinomatosis [facility]) Based on her previous history of abnormal CT imaging in 2014 for concern for ovarian cancer and current cytology from recent malignant ascites, overall presentation is highly suspicious for metastatic ovarian cancer causing peritoneal carcinomatosis, ascites and pleural effusion Patient is very weak and frail with multiple comorbidities She has poor performance status We discussed the rationale of treatment She is not a candidate for surgery or radiation Her only option is systemic chemotherapy I did not go into the fine details of palliative chemotherapy as I am concerned about her frail status that chemotherapy could potentially shorten her life before it brings in any benefit of prolonging her life She will go home and think about it and will discuss further with her family  Given her possible diagnosis of ovarian cancer, we also discussed genetic counseling and genetic testing and she will go home and think about it  Pleural effusion on right She has detectable reduced breath sound on the right lung, likely secondary to persistent pleural effusion She have shortness of breath on minimal exertion but maintained excellent oxygen saturation on room air We discussed the risk and benefits of recurrent thoracentesis including risk of infection, bleeding and pneumothorax For now, I recommend observation only If she is interested to enroll in palliative care/hospice, she will qualify for oxygen therapy  Malignant ascites She has malignant ascites on exam We discussed the risk and benefits of repeat paracentesis Even though it causes  discomfort, I think the risk of paracentesis exceeds the benefit I recommend observation only for now  Chronic diastolic CHF (congestive heart failure) ([place]) She has loud murmur on exam and signs and symptoms with chronic congestive heart failure She will continue medical management as directed by her primary care doctor or cardiologist  Goals of care, counseling/discussion We have extensive discussions about goals of care The patient is leaning towards palliative care/hospice which I think is appropriate in this situation When she was recently discharged from the hospital, her daughter informed me that the palliative care has contacted her to schedule a home visit She has advanced directive and living will at home  I think it is not unreasonable to consider palliative care/hospice only for symptom management as chemotherapy is unlikely will prolong her life given her significant multiple comorbidities and her frail status Since I will not be prescribing any treatment for her and she has significant comorbidities, I will not be attending on file for her palliative care/hospice consult unless her primary care doctor is unwilling to be the attending on file   No orders of the defined types were placed in this encounter.   The total time spent in the appointment was 60 minutes encounter with patients including review of chart and various tests results, discussions about plan of care and coordination of care plan   All questions were answered. The patient knows to call the clinic with any problems, questions or concerns. No barriers to learning was detected.  Heath Lark, MD 3/4/20211:44 PM  CHIEF COMPLAINTS/PURPOSE OF CONSULTATION:  Diffuse abdominal carcinomatosis with malignant ascites and pleural effusion, likely metastatic ovarian cancer based on cytology, for further management  HISTORY OF PRESENTING ILLNESS:  Mallory Jensen  86 y.o. female is here because of recent diagnosis of  cancer She is here today accompanied by her daughter, Mallory Jensen The patient is a retired Technical brewer She has 5 children In 2014, she was referred here by her gynecologist to see GYN surgeon, Dr. Alycia Rossetti for evaluation and management of ovarian cyst since on imaging study At the time of evaluation, she has made informed decision not to pursue surgery She was followed by gynecologist up until approximately 2 years ago when she made informed decision to stop surveillance examination and imaging Recently, around Thanksgiving of last year, she has progressive shortness of breath and abdominal distention She was recently admitted to the hospital for evaluation of acute on chronic renal failure, signs of congestive heart failure, early signs of decubitus ulcer, pleural effusion and clinical ascites  I have reviewed her chart and materials related to her cancer extensively and collaborated history with the patient. Summary of oncologic history is as follows: Oncology History  Primary peritoneal carcinomatosis ([place])  03/13/2012 Imaging   Ct scan of abdomen and pelvis 1.  Nonspecific rather diffuse mesenteric stranding which appears somewhat centered about the pancreas - correlation with amylase and lipase levels is recommended. 2.  Interval progression of now severe (> 75%) compression deformity of the T12 vertebral body with approximately 6 mm of retrolisthesis of the posterior inferior endplate of A21 towards the spinal canal.  As an acute on chronic injury is not excluded, correlation for point tenderness at this location is recommended. 3.  Approximately 2.9 x 1.8 cm right-sided cystic adnexal lesion, an abnormal finding in this postmenopausal patient. 4.  Colonic diverticulosis without evidence of diverticulitis. 5.  Extensive coronary artery calcifications.   04/03/2019 - 04/10/2019 Hospital Admission   She was admitted to the hospital with acute on chronic renal failure, signs of congestive heart failure,  very mild decubitus ulcer, clinical ascites and bilateral pleural effusion.  Cytology confirmed malignancy   04/04/2019 Echocardiogram   1. Left ventricular ejection fraction, by estimation, is 70 to 75%. The left ventricle has hyperdynamic function. The left ventricle has no regional wall motion abnormalities. There is mild left ventricular hypertrophy. Left ventricular diastolic parameters are consistent with Grade I diastolic dysfunction (impaired relaxation).   2. Right ventricular systolic function is normal. The right ventricular size is normal. There is moderately elevated pulmonary artery systolic pressure. The estimated right ventricular systolic pressure is 30.8 mmHg.   3. Left atrial size was mildly dilated.   4. The mitral valve is degenerative. Mild mitral valve regurgitation.   5. The aortic valve is tricuspid. Aortic valve regurgitation is not visualized. Moderate aortic valve stenosis. Aortic valve mean gradient measures 20.5 mmHg. Aortic valve Vmax measures 3.24 m/s.   6. The inferior vena cava is normal in size with greater than 50% respiratory variability, suggesting right atrial pressure of 3 mmHg.   04/05/2019 Pathology Results   CYTOLOGY - NON PAP   DIAGNOSTIC COMMENTS:  Immunohistochemistry for MOC31, CK7, PAX 8 and WT-1 is positive.  ER and P53 demonstrate scattered weak to moderate positive staining.  CK20, TTF-1, CDX-2 and D2-40 are negative.  GATA-3 demonstrates scattered nonspecific staining.  The immunophenotype is most consistent with origin from the gynecologic tract.   04/05/2019 Procedure   Successful ultrasound-guided paracentesis yielding 5.2 liters of peritoneal fluid   04/06/2019 Imaging   U/S RUQ Status post cholecystectomy. Increased echogenicity of hepatic parenchyma is noted suggesting diffuse hepatocellular disease. Mild ascites.   04/08/2019 Procedure   Successful ultrasound guided  diagnostic and therapeutic right thoracentesis yielding 750 mL of pleural  fluid   04/08/2019 Tumor Marker   Patient's tumor was tested for the following markers: CA-125 Results of the tumor marker test revealed 693.   04/15/2019 Cancer Staging   Staging form: Ovary, Fallopian Tube, and Primary Peritoneal Carcinoma, AJCC 8th Edition - Clinical: Stage IVA (cT3, cNX, cM1a) - Signed by Heath Lark, MD on 04/15/2019    Since discharge from the hospital, her energy level is fair She is eating reasonably well She had a fall in January but no recent falls Denies recent nausea or constipation She denies pain She have shortness of breath on minimal exertion including talking or eating She tends to lean on her right side when she sleeps She has mild occasional nonproductive cough  MEDICAL HISTORY:  Past Medical History:  Diagnosis Date  . Acute pancreatitis January 2014  . Chronic back pain   . Chronic fatigue   . Compression fracture of vertebral column ([place])   . Headache    PMH  . HTN (hypertension)   . Hypercholesteremia   . Macular hole   . Mild aortic stenosis   . MRSA (methicillin resistant Staphylococcus aureus)   . Osteoporosis   . Ovarian cyst   . RBBB   . Staph infection     SURGICAL HISTORY: Past Surgical History:  Procedure Laterality Date  . ABDOMINAL HYSTERECTOMY    . APPENDECTOMY    . athroscopy Right shoulder    . Biopsy Left Breast x 2    . CESAREAN SECTION    . CHOLECYSTECTOMY    . IR PARACENTESIS  04/05/2019  . IR THORACENTESIS ASP PLEURAL SPACE W/IMG GUIDE  04/08/2019  . PARS PLANA VITRECTOMY Left 10/09/2016   Procedure: PARS PLANA VITRECTOMY WITH 25 GAUGE;  Surgeon: Bernarda Caffey, MD;  Location: [facility];  Service: Ophthalmology;  Laterality: Left;  . PARS PLANA VITRECTOMY W/ REPAIR OF MACULAR HOLE Right 2015    SOCIAL HISTORY: Social History   Socioeconomic History  . Marital status: Widowed    Spouse name: Not on file  . Number of children: 5  . Years of education: Not on file  . Highest education level: Not on file   Occupational History  . Occupation: catering  Tobacco Use  . Smoking status: Never Smoker  . Smokeless tobacco: Never Used  Substance and Sexual Activity  . Alcohol use: Yes    Comment: glass of wine infrequently  . Drug use: No  . Sexual activity: Never  Other Topics Concern  . Not on file  Social History Narrative  . Not on file   Social Determinants of Health   Financial Resource Strain:   . Difficulty of Paying Living Expenses: Not on file  Food Insecurity:   . Worried About Charity fundraiser in the Last Year: Not on file  . Ran Out of Food in the Last Year: Not on file  Transportation Needs:   . Lack of Transportation (Medical): Not on file  . Lack of Transportation (Non-Medical): Not on file  Physical Activity:   . Days of Exercise per Week: Not on file  . Minutes of Exercise per Session: Not on file  Stress:   . Feeling of Stress : Not on file  Social Connections:   . Frequency of Communication with Friends and Family: Not on file  . Frequency of Social Gatherings with Friends and Family: Not on file  . Attends Religious Services: Not on file  .  Active Member of Clubs or Organizations: Not on file  . Attends Archivist Meetings: Not on file  . Marital Status: Not on file  Intimate Partner Violence:   . Fear of Current or Ex-Partner: Not on file  . Emotionally Abused: Not on file  . Physically Abused: Not on file  . Sexually Abused: Not on file    FAMILY HISTORY: Family History  Problem Relation Age of Onset  . Cataracts Mother   . Cancer Father        lung ca  . Heart disease Brother   . Cancer Brother        lung ca  . Heart disease Brother   . Cancer Other   . Heart disease Other   . Cancer Daughter        melanoma  . Amblyopia Neg Hx   . Blindness Neg Hx   . Glaucoma Neg Hx   . Macular degeneration Neg Hx   . Retinal detachment Neg Hx   . Strabismus Neg Hx   . Retinitis pigmentosa Neg Hx     ALLERGIES:  is allergic to  penicillins; codeine; and tramadol hcl.  MEDICATIONS:  Current Outpatient Medications  Medication Sig Dispense Refill  . albuterol (VENTOLIN HFA) 108 (90 Base) MCG/ACT inhaler Inhale 3 puffs into the lungs 2 (two) times daily.    Marland Kitchen aspirin EC 81 MG tablet Take 1 tablet (81 mg total) by mouth daily. 90 tablet 3  . calcium-vitamin D (OSCAL WITH D) 500-200 MG-UNIT per tablet Take 1 tablet by mouth daily.    . Cholecalciferol (VITAMIN D3) 2000 units TABS Take 2,000 Units by mouth daily.    Marland Kitchen docusate sodium (COLACE) 100 MG capsule Take 100 mg by mouth at bedtime.    . furosemide (LASIX) 20 MG tablet Take 1 tablet (20 mg total) by mouth daily as needed for edema. (Patient taking differently: Take 20 mg by mouth daily. ) 30 tablet   . GINKGO BILOBA PO Take 1 tablet by mouth daily.    . Glucosamine-Chondroitin (COSAMIN DS PO) Take 1 tablet by mouth daily.     . iron polysaccharides (FERREX 150) 150 MG capsule Take 150 mg by mouth daily.    . magnesium 30 MG tablet Take 30 mg by mouth daily.    . Multiple Vitamin (MULTIVITAMIN WITH MINERALS) TABS Take 1 tablet by mouth daily. Centrum Silver    . pantoprazole (PROTONIX) 40 MG tablet Take 1 tablet (40 mg total) by mouth daily.    Vladimir Faster Glycol-Propyl Glycol (SYSTANE ULTRA) 0.4-0.3 % SOLN Place 1-2 drops into both eyes 4 (four) times daily as needed (for dry eyes).     No current facility-administered medications for this visit.    REVIEW OF SYSTEMS:   Constitutional: Denies fevers, chills or abnormal night sweats Eyes: Denies blurriness of vision, double vision or watery eyes Ears, nose, mouth, throat, and face: Denies mucositis or sore throat Cardiovascular: Denies palpitation, chest discomfort  Gastrointestinal:  Denies nausea, heartburn or change in bowel habits Skin: Denies abnormal skin rashes Lymphatics: Denies new lymphadenopathy or easy bruising Neurological:Denies numbness, tingling or new weaknesses Behavioral/Psych: Mood is  stable, no new changes  All other systems were reviewed with the patient and are negative.  PHYSICAL EXAMINATION: ECOG PERFORMANCE STATUS: 3 - Symptomatic, >50% confined to bed  Vitals:   04/15/19 1249  BP: (!) 150/64  Pulse: 78  Resp: 18  Temp: 98.5 F (36.9 C)  SpO2: 97%   Filed  Weights   04/15/19 1249  Weight: 143 lb 6.4 oz (65 kg)    GENERAL:alert, no distress and comfortable.  She looks frail.  She appears to have mild shortness of breath after prolonged talking SKIN: skin color, texture, turgor are normal, no rashes or significant lesions EYES: normal, conjunctiva are pink and non-injected, sclera clear OROPHARYNX:no exudate, no erythema and lips, buccal mucosa, and tongue normal  NECK: supple, thyroid normal size, non-tender, without nodularity LYMPH:  no palpable lymphadenopathy in the cervical, axillary or inguinal LUNGS: No wheezing, mildly increased breathing effort, reduced breath sound on the lower half of the right lung base  HEART: regular rate & rhythm with loud ejection systolic murmurs with moderate bilateral lower extremity edema ABDOMEN:abdomen soft, non-tender and normal bowel sounds.  It is distended with ascites Musculoskeletal:no cyanosis of digits and no clubbing  PSYCH: alert & oriented x 3 with fluent speech NEURO: no focal motor/sensory deficits  LABORATORY DATA:  I have reviewed the data as listed Lab Results  Component Value Date   WBC 10.6 (H) 04/09/2019   HGB 9.6 (L) 04/09/2019   HCT 30.6 (L) 04/09/2019   MCV 94.2 04/09/2019   PLT 310 04/09/2019   Recent Labs    04/02/19 1611 04/03/19 0333 04/05/19 0117 04/05/19 0117 04/06/19 0146 04/07/19 0249 04/09/19 0222  NA 139   < > 139   < > 137 139 139  K 6.1*   < > 5.2*   < > 4.8 4.7 5.0  CL 105   < > 109   < > 109 109 109  CO2 19*   < > 21*   < > 18* 22 23  GLUCOSE 101*   < > 102*   < > 139* 115* 102*  BUN 52*   < > 47*   < > 41* 39* 35*  CREATININE 1.99*   < > 1.85*   < > 1.83* 1.77*  1.38*  CALCIUM 8.6*   < > 8.1*   < > 7.9* 8.0* 8.3*  GFRNONAA 21*   < > 23*   < > 23* 24* 33*  GFRAA 24*   < > 27*   < > 27* 28* 38*  PROT 6.5  --   --   --  4.9*  --   --   ALBUMIN 3.5  --  2.4*  --  2.7*  --   --   AST 17  --   --   --  16  --   --   ALT 10  --   --   --  8  --   --   ALKPHOS 97  --   --   --  49  --   --   BILITOT 0.3  --   --   --  0.6  --   --    < > = values in this interval not displayed.    RADIOGRAPHIC STUDIES: I have personally reviewed the radiological images as listed and agreed with the findings in the report. CT ABDOMEN PELVIS WO CONTRAST  Result Date: 04/07/2019 CLINICAL DATA:  Ascites with malignant cells on pathology. Suspicion for gyn neoplasm. EXAM: CT ABDOMEN AND PELVIS WITHOUT CONTRAST TECHNIQUE: Multidetector CT imaging of the abdomen and pelvis was performed following the standard protocol without IV contrast. COMPARISON:  03/13/2012 FINDINGS: Lower chest: Large right pleural effusion with right lower lobe collapse/consolidation. Small left pleural effusion with left lower lobe collapse/consolidation. Hepatobiliary: Subtle nodularity of liver contour raises  suspicion for cirrhosis. No gross hepatic mass lesion evident on this noncontrast study. Gallbladder is surgically absent. No intrahepatic or extrahepatic biliary dilation. Pancreas: No focal mass lesion. No dilatation of the main duct. No intraparenchymal cyst. No peripancreatic edema. Spleen: No splenomegaly. No focal mass lesion. Adrenals/Urinary Tract: No adrenal nodule or mass. 11 mm exophytic cyst noted interpolar right kidney. Left kidney unremarkable. No evidence for hydroureter. The urinary bladder appears normal for the degree of distention. Stomach/Bowel: Moderate hiatal hernia. Stomach nondistended. Large duodenal diverticulum evident. No small bowel wall thickening. No small bowel dilatation. The terminal ileum is normal. Appendix is not discretely evident. No gross colonic mass. No colonic  wall thickening. Diverticular changes are noted in the left colon without evidence of diverticulitis. Vascular/Lymphatic: There is abdominal aortic atherosclerosis without aneurysm. No retroperitoneal lymphadenopathy. No pelvic sidewall lymphadenopathy. Reproductive: Uterus surgically absent. No definite adnexal mass although lack of intravenous contrast and ascites hinders assessment. Other: Moderate volume ascites noted with fluid collection anterior upper abdomen appearing loculated on the right. Omental nodularity noted inferiorly at the midline with somewhat confluent omental or mesenteric soft tissue measuring 5.6 x 1.2 cm on image 54/3. Possible confluent omental soft tissue in the hepatic flexure on 47/3. Musculoskeletal: Diffuse body wall edema. T12 compression fracture stable back to 03/13/2012. Bones are diffusely demineralized. IMPRESSION: 1. Large right and small left pleural effusions with bilateral lower lobe collapse/consolidation. 2. Moderate volume ascites with probable components of loculated fluid in the anterior upper abdomen. 3. Suspicion for nodular omental soft tissue although assessment is limited by lack of intravenous contrast. 4. No definite adnexal mass. 5. Subtle nodularity of the liver contour raises suspicion for cirrhosis. 6. Diffuse body wall edema. 7. Moderate hiatal hernia. 8. T12 compression fracture, stable back to 03/13/2012. 9. Aortic Atherosclerosis (ICD10-I70.0). Electronically Signed   By: Misty Stanley M.D.   On: 04/07/2019 18:18   DG Chest 1 View  Result Date: 04/08/2019 CLINICAL DATA:  Status post right-sided thoracentesis. EXAM: CHEST  1 VIEW COMPARISON:  04/03/2019 FINDINGS: There is a persistent moderate size right-sided pleural effusion with adjacent airspace disease. There is a small left-sided pleural effusion. There is no pneumothorax. Heart size remains stable. Aortic calcifications are noted. There is no acute osseous abnormality. IMPRESSION: 1. No  pneumothorax status post right-sided thoracentesis. 2. Persistent moderate-sized right-sided pleural effusion with adjacent airspace disease favored to represent compressive atelectasis. 3. Persistent small left-sided pleural effusion. Electronically Signed   By: Constance Holster M.D.   On: 04/08/2019 16:23   DG Abd 1 View  Result Date: 04/04/2019 CLINICAL DATA:  Abdominal distension EXAM: ABDOMEN - 1 VIEW COMPARISON:  None. FINDINGS: The bowel gas pattern is normal. No radio-opaque calculi or other significant radiographic abnormality are seen. IMPRESSION: Negative. Electronically Signed   By: Ulyses Jarred M.D.   On: 04/04/2019 19:29   US RENAL  Result Date: 04/04/2019 CLINICAL DATA:  Acute renal failure EXAM: RENAL / URINARY TRACT ULTRASOUND COMPLETE COMPARISON:  Abdominal ultrasound dated 03/14/2012 FINDINGS: Right Kidney: Renal measurements: 9.1 x 4.0 x 5.4 cm = volume: 102 mL. Renal cortical thinning and increased echogenicity are consistent with chronic kidney disease. A 1.5 cm cyst is seen. No solid mass or hydronephrosis visualized. Left Kidney: Renal measurements: 9.3 x 4.6 x 4.2 cm = volume: 94 mL. Renal cortical thinning and increased echogenicity are consistent with chronic kidney disease. No mass or hydronephrosis visualized. Bladder: Not visualized. Other: Ascites and a right pleural effusion are noted. IMPRESSION: 1. Findings consistent  with chronic kidney disease. No hydronephrosis. 2. Abdominal ascites. 3. Right pleural effusion. Electronically Signed   By: Zerita Boers M.D.   On: 04/04/2019 16:43   DG Chest Port 1 View  Result Date: 04/03/2019 CLINICAL DATA:  Acute kidney injury EXAM: PORTABLE CHEST 1 VIEW COMPARISON:  03/17/2012 FINDINGS: Hazy opacity at the right base. Cardiomegaly and low lung volumes. No edema or pneumothorax. IMPRESSION: Increased hazy opacity at the right base at least partially from pleural fluid. The underlying obscured lung could be atelectatic or  consolidated. Electronically Signed   By: Monte Fantasia M.D.   On: 04/03/2019 06:15   ECHOCARDIOGRAM COMPLETE  Result Date: 04/04/2019    ECHOCARDIOGRAM REPORT   Patient Name:   KASHA HOWETH Date of Exam: 04/04/2019 Medical Rec #:  [id number]           Height:       60.0 in Accession #:    [id number]          Weight:       147.9 lb Date of Birth:  December 24, 1922            BSA:          1.642 m Patient Age:    75 years            BP:           134/64 mmHg Patient Gender: F                   HR:           68 bpm. Exam Location:  Inpatient Procedure: 2D Echo Indications:    CHF-Acute Diastolic V89.38  History:        Patient has prior history of Echocardiogram examinations, most                 recent 08/30/2012. Risk Factors:Hypertension.  Sonographer:    Mikki Santee RDCS (AE) Referring Phys: [id number] RONDELL A SMITH IMPRESSIONS  1. Left ventricular ejection fraction, by estimation, is 70 to 75%. The left ventricle has hyperdynamic function. The left ventricle has no regional wall motion abnormalities. There is mild left ventricular hypertrophy. Left ventricular diastolic parameters are consistent with Grade I diastolic dysfunction (impaired relaxation).  2. Right ventricular systolic function is normal. The right ventricular size is normal. There is moderately elevated pulmonary artery systolic pressure. The estimated right ventricular systolic pressure is 25.8 mmHg.  3. Left atrial size was mildly dilated.  4. The mitral valve is degenerative. Mild mitral valve regurgitation.  5. The aortic valve is tricuspid. Aortic valve regurgitation is not visualized. Moderate aortic valve stenosis. Aortic valve mean gradient measures 20.5 mmHg. Aortic valve Vmax measures 3.24 m/s.  6. The inferior vena cava is normal in size with greater than 50% respiratory variability, suggesting right atrial pressure of 3 mmHg. FINDINGS  Left Ventricle: Left ventricular ejection fraction, by estimation, is 70 to 75%. The left  ventricle has hyperdynamic function. The left ventricle has no regional wall motion abnormalities. The left ventricular internal cavity size was normal in size. There is mild left ventricular hypertrophy. Left ventricular diastolic parameters are consistent with Grade I diastolic dysfunction (impaired relaxation). Right Ventricle: The right ventricular size is normal. No increase in right ventricular wall thickness. Right ventricular systolic function is normal. There is moderately elevated pulmonary artery systolic pressure. The tricuspid regurgitant velocity is 3.09 m/s, and with an assumed right atrial pressure of 3 mmHg, the estimated right ventricular systolic pressure  is 41.2 mmHg. Left Atrium: Left atrial size was mildly dilated. Right Atrium: Right atrial size was normal in size. Pericardium: There is no evidence of pericardial effusion. Presence of pericardial fat pad. Mitral Valve: The mitral valve is degenerative in appearance. There is mild thickening of the mitral valve leaflet(s). Moderate mitral annular calcification. Mild mitral valve regurgitation. Tricuspid Valve: The tricuspid valve is grossly normal. Tricuspid valve regurgitation is mild. Aortic Valve: The aortic valve is tricuspid. Aortic valve regurgitation is not visualized. Moderate aortic stenosis is present. Mild aortic valve annular calcification. Aortic valve mean gradient measures 20.5 mmHg. Aortic valve peak gradient measures 42.0 mmHg. Aortic valve area, by VTI measures 0.63 cm. Pulmonic Valve: The pulmonic valve was grossly normal. Pulmonic valve regurgitation is not visualized. Aorta: The aortic root is normal in size and structure. Venous: The inferior vena cava is normal in size with greater than 50% respiratory variability, suggesting right atrial pressure of 3 mmHg. IAS/Shunts: No atrial level shunt detected by color flow Doppler.  LEFT VENTRICLE PLAX 2D LVIDd:         3.80 cm  Diastology LVIDs:         2.10 cm  LV e' lateral:    5.98 cm/s LV PW:         1.10 cm  LV E/e' lateral: 17.7 LV IVS:        1.20 cm  LV e' medial:    7.18 cm/s LVOT diam:     1.60 cm  LV E/e' medial:  14.8 LV SV:         43.63 ml LV SV Index:   26.57 LVOT Area:     2.01 cm  RIGHT VENTRICLE RV S prime:     12.70 cm/s TAPSE (M-mode): 1.8 cm LEFT ATRIUM           Index       RIGHT ATRIUM           Index LA diam:      3.60 cm 2.19 cm/m  RA Area:     11.30 cm LA Vol (A2C): 36.7 ml 22.35 ml/m RA Volume:   25.80 ml  15.71 ml/m LA Vol (A4C): 77.6 ml 47.26 ml/m  AORTIC VALVE AV Area (Vmax):    0.65 cm AV Area (Vmean):   0.64 cm AV Area (VTI):     0.63 cm AV Vmax:           324.00 cm/s AV Vmean:          213.500 cm/s AV VTI:            0.694 m AV Peak Grad:      42.0 mmHg AV Mean Grad:      20.5 mmHg LVOT Vmax:         104.00 cm/s LVOT Vmean:        67.500 cm/s LVOT VTI:          0.217 m LVOT/AV VTI ratio: 0.31  AORTA Ao Root diam: 3.10 cm MITRAL VALVE                TRICUSPID VALVE MV Area (PHT): 2.56 cm     TR Peak grad:   38.2 mmHg MV Decel Time: 296 msec     TR Vmax:        309.00 cm/s MV E velocity: 106.00 cm/s MV A velocity: 165.00 cm/s  SHUNTS MV E/A ratio:  0.64         Systemic VTI:  0.22 m  Systemic Diam: 1.60 cm Rozann Lesches MD Electronically signed by Rozann Lesches MD Signature Date/Time: 04/04/2019/11:34:53 AM    Final    US LIVER DOPPLER  Result Date: 04/06/2019 CLINICAL DATA:  Ascites. EXAM: DUPLEX ULTRASOUND OF LIVER TECHNIQUE: Color and duplex Doppler ultrasound was performed to evaluate the hepatic in-flow and out-flow vessels. COMPARISON:  None. FINDINGS: Liver: Increased echogenicity of hepatic parenchyma is noted suggesting diffuse hepatocellular disease. Normal hepatic contour without nodularity. No focal lesion, mass or intrahepatic biliary ductal dilatation. Main Portal Vein size: 1.2 cm Portal Vein Velocities Main Prox:  29 cm/sec Main Mid: 42.2 cm/sec Main Dist:  37.3 cm/sec Right: 37.8 cm/sec Left: 22.3  cm/sec Normal hepatopetal flow is noted in the portal veins. Hepatic Vein Velocities Right:  65 cm/sec Middle:  58.9 cm/sec Left:  31 cm/sec Normal hepatofugal flow is noted in the hepatic veins. IVC: Present and patent with normal respiratory phasicity. Hepatic Artery Velocity:  329 cm/sec Splenic Vein Velocity:  43 cm/sec Spleen: 5.0 cm x 9.9 cm x 3.6 cm with a total volume of 92 cm^3 (411 cm^3 is upper limit normal) Portal Vein Occlusion/Thrombus: No Splenic Vein Occlusion/Thrombus: No Ascites: Mild ascites is noted. Varices: None Bilateral pleural effusions are noted. IMPRESSION: No Doppler evidence of portal, hepatic or splenic venous thrombosis or occlusion. Increased echogenicity of hepatic parenchyma is noted suggesting diffuse hepatocellular disease. Mild ascites. Electronically Signed   By: Marijo Conception M.D.   On: 04/06/2019 10:50   VAS [place] LOWER EXTREMITY VENOUS (DVT)  Result Date: 04/04/2019  Lower Venous DVTStudy Indications: Edema.  Comparison Study: No prior study on file for comparison Performing Technologist: Sharion Dove RVS  Examination Guidelines: A complete evaluation includes B-mode imaging, spectral Doppler, color Doppler, and power Doppler as needed of all accessible portions of each vessel. Bilateral testing is considered an integral part of a complete examination. Limited examinations for reoccurring indications may be performed as noted. The reflux portion of the exam is performed with the patient in reverse Trendelenburg.  +---------+---------------+---------+-----------+----------+-------------------+ RIGHT    CompressibilityPhasicitySpontaneityPropertiesThrombus Aging      +---------+---------------+---------+-----------+----------+-------------------+ CFV      Full           Yes      Yes                                      +---------+---------------+---------+-----------+----------+-------------------+ SFJ      Full                                                              +---------+---------------+---------+-----------+----------+-------------------+ FV Prox  Full                                                             +---------+---------------+---------+-----------+----------+-------------------+ FV Mid   Full                                                             +---------+---------------+---------+-----------+----------+-------------------+  FV Distal               Yes      Yes                  patent by color and                                                       Doppler             +---------+---------------+---------+-----------+----------+-------------------+ PFV      Full                                                             +---------+---------------+---------+-----------+----------+-------------------+ POP      Full           Yes      Yes                                      +---------+---------------+---------+-----------+----------+-------------------+ PTV      Full                                                             +---------+---------------+---------+-----------+----------+-------------------+ PERO     Full                                                             +---------+---------------+---------+-----------+----------+-------------------+   +---------+---------------+---------+-----------+----------+--------------+ LEFT     CompressibilityPhasicitySpontaneityPropertiesThrombus Aging +---------+---------------+---------+-----------+----------+--------------+ CFV      Full           Yes      Yes                                 +---------+---------------+---------+-----------+----------+--------------+ SFJ      Full                                                        +---------+---------------+---------+-----------+----------+--------------+ FV Prox  Full                                                         +---------+---------------+---------+-----------+----------+--------------+ FV Mid   Full                                                        +---------+---------------+---------+-----------+----------+--------------+  FV DistalFull                                                        +---------+---------------+---------+-----------+----------+--------------+ PFV      Full                                                        +---------+---------------+---------+-----------+----------+--------------+ POP      Full           Yes      Yes                                 +---------+---------------+---------+-----------+----------+--------------+ PTV      Full                                                        +---------+---------------+---------+-----------+----------+--------------+ PERO     Full                                                        +---------+---------------+---------+-----------+----------+--------------+     Summary: BILATERAL: - No evidence of deep vein thrombosis seen in the lower extremities, bilaterally.   *See table(s) above for measurements and observations. Electronically signed by Deitra Mayo MD on 04/04/2019 at 2:16:00 PM.    Final    US Abdomen Limited RUQ  Result Date: 04/06/2019 CLINICAL DATA:  Ascites. EXAM: ULTRASOUND ABDOMEN LIMITED RIGHT UPPER QUADRANT COMPARISON:  March 14, 2012. FINDINGS: Gallbladder: Status post cholecystectomy. Common bile duct: Diameter: 7 mm which is within normal limits. Liver: Increased echogenicity of hepatic parenchyma is noted suggesting diffuse hepatocellular disease. No definite focal abnormality is seen at this time. Portal vein is patent on color Doppler imaging with normal direction of blood flow towards the liver. Other: Mild ascites is noted. IMPRESSION: Status post cholecystectomy. Increased echogenicity of hepatic parenchyma is noted suggesting diffuse hepatocellular disease. Mild  ascites. Electronically Signed   By: Marijo Conception M.D.   On: 04/06/2019 10:45   IR Paracentesis  Result Date: 04/05/2019 INDICATION: Acute on chronic kidney disease. Ascites. Request for diagnostic and therapeutic paracentesis. EXAM: ULTRASOUND GUIDED PARACENTESIS MEDICATIONS: 1% lidocaine 10 mL COMPLICATIONS: None immediate. PROCEDURE: Informed written consent was obtained from the patient after a discussion of the risks, benefits and alternatives to treatment. A timeout was performed prior to the initiation of the procedure. Initial ultrasound scanning demonstrates a large amount of ascites within the right lower abdominal quadrant. The right lower abdomen was prepped and draped in the usual sterile fashion. 1% lidocaine was used for local anesthesia. Following this, a 19 gauge, 7-cm, Yueh catheter was introduced. An ultrasound image was saved for documentation purposes. The paracentesis was performed. The catheter was removed and a dressing was applied. The patient tolerated the procedure  well without immediate post procedural complication. FINDINGS: A total of approximately 5.2 L of clear yellow fluid was removed. Samples were sent to the laboratory as requested by the clinical team. IMPRESSION: Successful ultrasound-guided paracentesis yielding 5.2 liters of peritoneal fluid. Read by: Gareth Eagle, PA-C Electronically Signed   By: Corrie Mckusick D.O.   On: 04/05/2019 14:11   IR THORACENTESIS ASP PLEURAL SPACE W/IMG GUIDE  Result Date: 04/08/2019 INDICATION: Patient with history of malignant ascites of suspicious GYN origin, now with right pleural effusion. Request is made for right thoracentesis. EXAM: ULTRASOUND GUIDED DIAGNOSTIC AND THERAPEUTIC RIGHT THORACENTESIS MEDICATIONS: 10 mL 1% lidocaine COMPLICATIONS: None immediate. PROCEDURE: An ultrasound guided thoracentesis was thoroughly discussed with the patient and questions answered. The benefits, risks, alternatives and complications were also  discussed. The patient understands and wishes to proceed with the procedure. Written consent was obtained. Ultrasound was performed to localize and mark an adequate pocket of fluid in the right chest. The area was then prepped and draped in the normal sterile fashion. 1% Lidocaine was used for local anesthesia. Under ultrasound guidance a 6 Fr Safe-T-Centesis catheter was introduced. Thoracentesis was performed. The catheter was removed and a dressing applied. FINDINGS: A total of approximately 750 mL of clear, yellow fluid was removed. Samples were sent to the laboratory as requested by the clinical team. IMPRESSION: Successful ultrasound guided diagnostic and therapeutic right thoracentesis yielding 750 mL of pleural fluid. Read by: Brynda Greathouse PA-C Electronically Signed   By: Corrie Mckusick D.O.   On: 04/08/2019 16:55

## 2019-04-16 ENCOUNTER — Encounter: Payer: Self-pay | Admitting: *Deleted

## 2019-04-16 ENCOUNTER — Other Ambulatory Visit: Payer: Self-pay | Admitting: *Deleted

## 2019-04-16 NOTE — Patient Outreach (Signed)
[place] [facility]) Care Management [facility] CM Telephone Outreach, [place] Red Alert notification # 2- General Discharge PCP office completes Transition of Care follow up post-hospital discharge [facility] discharge day # 6 04/16/2019  Mallory Jensen [facility] Apr 13, 1922 [id number]  EMMI Red Alert notification/ General discharge EMMI call date/ day # : Monday April 12, 2019; day # 1 Red Alert reason(s): "having other problems/ questions"  Second EMMI red Alert notification/ General discharge received this morning, 04/16/19 EMMI call date/ day #: Thursday April 15, 2019; day # 4 Red Alert reason(s): "sad depressed hopeless"  Unsuccessful second telephone outreach attempt to [employer], 86 y/o female referred to [facility] RN CM 04/13/19 by [facility] CMA for EMMI Red Alert notification as above; patient was recently hospitalized February 20-27, 2021 for hyperkalemia associated with AKI, with fatigue and lower extremity swelling; patient was also noted to have pleural effusion which required thoracentesis while hospitalized.  Patient was discharged home to self- care with home health services through [place] program; patient also is active with [facility] through [place].  Patient has history including, but not limited to, HTN/ HLD; CKD; dCHF with AS; anemia; and hypothyroidism.  HIPAA compliant voice mail message left for patient, requesting return call back.  Plan:  [place] unsuccessful patient outreach letter deferred- based on previous [facility] CM care coordination outreach from [place], [facility], notifying that patient is currently active with both [place] program and [place] team  Will re-attempt [place] telephone outreach within 4 business days if I do not hear back from patient first  Oneta Rack, RN, BSN, [employer] Coordinator [facility] Care Management  [phone]

## 2019-04-19 ENCOUNTER — Telehealth: Payer: Self-pay

## 2019-04-19 DIAGNOSIS — N1832 Chronic kidney disease, stage 3b: Secondary | ICD-10-CM | POA: Diagnosis not present

## 2019-04-19 DIAGNOSIS — D631 Anemia in chronic kidney disease: Secondary | ICD-10-CM | POA: Diagnosis not present

## 2019-04-19 DIAGNOSIS — I083 Combined rheumatic disorders of mitral, aortic and tricuspid valves: Secondary | ICD-10-CM | POA: Diagnosis not present

## 2019-04-19 DIAGNOSIS — I130 Hypertensive heart and chronic kidney disease with heart failure and stage 1 through stage 4 chronic kidney disease, or unspecified chronic kidney disease: Secondary | ICD-10-CM | POA: Diagnosis not present

## 2019-04-19 DIAGNOSIS — I5033 Acute on chronic diastolic (congestive) heart failure: Secondary | ICD-10-CM | POA: Diagnosis not present

## 2019-04-19 DIAGNOSIS — I0981 Rheumatic heart failure: Secondary | ICD-10-CM | POA: Diagnosis not present

## 2019-04-19 NOTE — Telephone Encounter (Signed)
Daughter called and left a message to call her.  Called back. Her Mom has decided to not get any type of chemo treatment. It is her understanding that she would be referred back to PCP.

## 2019-04-22 ENCOUNTER — Other Ambulatory Visit: Payer: Self-pay | Admitting: *Deleted

## 2019-04-22 ENCOUNTER — Encounter: Payer: Self-pay | Admitting: *Deleted

## 2019-04-22 DIAGNOSIS — I130 Hypertensive heart and chronic kidney disease with heart failure and stage 1 through stage 4 chronic kidney disease, or unspecified chronic kidney disease: Secondary | ICD-10-CM | POA: Diagnosis not present

## 2019-04-22 DIAGNOSIS — I083 Combined rheumatic disorders of mitral, aortic and tricuspid valves: Secondary | ICD-10-CM | POA: Diagnosis not present

## 2019-04-22 DIAGNOSIS — N1832 Chronic kidney disease, stage 3b: Secondary | ICD-10-CM | POA: Diagnosis not present

## 2019-04-22 DIAGNOSIS — D631 Anemia in chronic kidney disease: Secondary | ICD-10-CM | POA: Diagnosis not present

## 2019-04-22 DIAGNOSIS — I0981 Rheumatic heart failure: Secondary | ICD-10-CM | POA: Diagnosis not present

## 2019-04-22 DIAGNOSIS — I5033 Acute on chronic diastolic (congestive) heart failure: Secondary | ICD-10-CM | POA: Diagnosis not present

## 2019-04-22 NOTE — Patient Outreach (Signed)
[place] [facility]) Care Management [place] Telephone Outreach PCP office completes Transition of Care follow up post-hospital discharge Post-hospital discharge day # 12  04/22/2019  Serra Lind [facility] Dec 26, 1922 [id number]  EMMI Red Alert notification/ General discharge EMMI call date/ day # : Monday April 12, 2019; day # 1 Red Alert reason(s): "having other problems/ questions"  Second EMMI red Alert notification/ General discharge received this morning, 04/16/19 EMMI call date/ day #: Thursday April 15, 2019; day # 4 Red Alert reason(s): "sad depressed hopeless"  Successful third telephone outreachattemptto Mallory Jensen, 86 y/o female referred to [facility] RN CM 04/13/19 by [facility] CMA for EMMI Red Alert notificationas above; patient was recently hospitalized February 20-27, 2021 for hyperkalemia associated with AKI, with fatigue and lower extremity swelling; patient was also noted to have pleural effusion which required thoracentesis while hospitalized. Patient was discharged home to self- care with home health services through [place] program; patient also is active with [facility] through [place].Patient has history including, but not limited to, HTN/ HLD; CKD; dCHF with AS; anemia; and hypothyroidism.  HIPAA/ identity verified with patient today; patient reports doing well, and states that she has her home health team at her house now; states they have been visiting her regularly and she also confirms that she is now active with the [facility] team through [place].  Patient declines EMMI screening today, stating she is busy this morning with home health team currently visiting at her home.  Patient adds that her daughter continues to regularly check on her and she assures me that all is going well.  Plan:  Will close [place] case, as patient is  active with an external palliatve care management program, and will make patient's PCP aware of same- will send case closure letter to PCP  Oneta Rack, RN, BSN, [place] Coordinator [facility] Care Management  [phone]

## 2019-04-26 DIAGNOSIS — I130 Hypertensive heart and chronic kidney disease with heart failure and stage 1 through stage 4 chronic kidney disease, or unspecified chronic kidney disease: Secondary | ICD-10-CM | POA: Diagnosis not present

## 2019-04-26 DIAGNOSIS — I083 Combined rheumatic disorders of mitral, aortic and tricuspid valves: Secondary | ICD-10-CM | POA: Diagnosis not present

## 2019-04-26 DIAGNOSIS — N1832 Chronic kidney disease, stage 3b: Secondary | ICD-10-CM | POA: Diagnosis not present

## 2019-04-26 DIAGNOSIS — I0981 Rheumatic heart failure: Secondary | ICD-10-CM | POA: Diagnosis not present

## 2019-04-26 DIAGNOSIS — I5033 Acute on chronic diastolic (congestive) heart failure: Secondary | ICD-10-CM | POA: Diagnosis not present

## 2019-04-26 DIAGNOSIS — D631 Anemia in chronic kidney disease: Secondary | ICD-10-CM | POA: Diagnosis not present

## 2019-04-27 DIAGNOSIS — N1832 Chronic kidney disease, stage 3b: Secondary | ICD-10-CM | POA: Diagnosis not present

## 2019-04-27 DIAGNOSIS — I5033 Acute on chronic diastolic (congestive) heart failure: Secondary | ICD-10-CM | POA: Diagnosis not present

## 2019-04-27 DIAGNOSIS — I0981 Rheumatic heart failure: Secondary | ICD-10-CM | POA: Diagnosis not present

## 2019-04-27 DIAGNOSIS — I130 Hypertensive heart and chronic kidney disease with heart failure and stage 1 through stage 4 chronic kidney disease, or unspecified chronic kidney disease: Secondary | ICD-10-CM | POA: Diagnosis not present

## 2019-04-27 DIAGNOSIS — D631 Anemia in chronic kidney disease: Secondary | ICD-10-CM | POA: Diagnosis not present

## 2019-04-27 DIAGNOSIS — I083 Combined rheumatic disorders of mitral, aortic and tricuspid valves: Secondary | ICD-10-CM | POA: Diagnosis not present

## 2019-04-28 DIAGNOSIS — I10 Essential (primary) hypertension: Secondary | ICD-10-CM | POA: Diagnosis not present

## 2019-04-28 DIAGNOSIS — I509 Heart failure, unspecified: Secondary | ICD-10-CM | POA: Diagnosis not present

## 2019-04-29 DIAGNOSIS — I130 Hypertensive heart and chronic kidney disease with heart failure and stage 1 through stage 4 chronic kidney disease, or unspecified chronic kidney disease: Secondary | ICD-10-CM | POA: Diagnosis not present

## 2019-04-29 DIAGNOSIS — I083 Combined rheumatic disorders of mitral, aortic and tricuspid valves: Secondary | ICD-10-CM | POA: Diagnosis not present

## 2019-04-29 DIAGNOSIS — I0981 Rheumatic heart failure: Secondary | ICD-10-CM | POA: Diagnosis not present

## 2019-04-29 DIAGNOSIS — N1832 Chronic kidney disease, stage 3b: Secondary | ICD-10-CM | POA: Diagnosis not present

## 2019-04-29 DIAGNOSIS — D631 Anemia in chronic kidney disease: Secondary | ICD-10-CM | POA: Diagnosis not present

## 2019-04-29 DIAGNOSIS — I5033 Acute on chronic diastolic (congestive) heart failure: Secondary | ICD-10-CM | POA: Diagnosis not present

## 2019-04-30 ENCOUNTER — Other Ambulatory Visit (HOSPITAL_COMMUNITY): Payer: Self-pay | Admitting: Internal Medicine

## 2019-04-30 ENCOUNTER — Other Ambulatory Visit: Payer: Self-pay | Admitting: Internal Medicine

## 2019-04-30 DIAGNOSIS — R188 Other ascites: Secondary | ICD-10-CM

## 2019-05-03 DIAGNOSIS — I5032 Chronic diastolic (congestive) heart failure: Secondary | ICD-10-CM | POA: Diagnosis not present

## 2019-05-03 DIAGNOSIS — E875 Hyperkalemia: Secondary | ICD-10-CM | POA: Diagnosis not present

## 2019-05-03 DIAGNOSIS — N1832 Chronic kidney disease, stage 3b: Secondary | ICD-10-CM | POA: Diagnosis not present

## 2019-05-03 DIAGNOSIS — C569 Malignant neoplasm of unspecified ovary: Secondary | ICD-10-CM | POA: Diagnosis not present

## 2019-05-03 DIAGNOSIS — R180 Malignant ascites: Secondary | ICD-10-CM | POA: Diagnosis not present

## 2019-05-03 DIAGNOSIS — N179 Acute kidney failure, unspecified: Secondary | ICD-10-CM | POA: Diagnosis not present

## 2019-05-04 ENCOUNTER — Ambulatory Visit (HOSPITAL_COMMUNITY)
Admission: RE | Admit: 2019-05-04 | Discharge: 2019-05-04 | Disposition: A | Discharge status: Home / Self Care | Payer: Medicare Other | Source: Ambulatory Visit | Attending: Internal Medicine | Admitting: Internal Medicine

## 2019-05-04 ENCOUNTER — Other Ambulatory Visit: Payer: Self-pay

## 2019-05-04 DIAGNOSIS — I130 Hypertensive heart and chronic kidney disease with heart failure and stage 1 through stage 4 chronic kidney disease, or unspecified chronic kidney disease: Secondary | ICD-10-CM | POA: Diagnosis not present

## 2019-05-04 DIAGNOSIS — I5033 Acute on chronic diastolic (congestive) heart failure: Secondary | ICD-10-CM | POA: Diagnosis not present

## 2019-05-04 DIAGNOSIS — I083 Combined rheumatic disorders of mitral, aortic and tricuspid valves: Secondary | ICD-10-CM | POA: Diagnosis not present

## 2019-05-04 DIAGNOSIS — R188 Other ascites: Secondary | ICD-10-CM | POA: Insufficient documentation

## 2019-05-04 DIAGNOSIS — I0981 Rheumatic heart failure: Secondary | ICD-10-CM | POA: Diagnosis not present

## 2019-05-04 DIAGNOSIS — N1832 Chronic kidney disease, stage 3b: Secondary | ICD-10-CM | POA: Diagnosis not present

## 2019-05-04 DIAGNOSIS — D631 Anemia in chronic kidney disease: Secondary | ICD-10-CM | POA: Diagnosis not present

## 2019-05-04 HISTORY — PX: IR PARACENTESIS: IMG2679

## 2019-05-04 MED ORDER — LIDOCAINE HCL 1 % IJ SOLN
INTRAMUSCULAR | Status: AC
  Filled 2019-05-04: qty 20

## 2019-05-04 MED ORDER — LIDOCAINE HCL (PF) 1 % IJ SOLN
INTRAMUSCULAR | Status: DC | PRN
  Administered 2019-05-04: 10 mL

## 2019-05-04 NOTE — Procedures (Signed)
   RLQ paracentesis  4.1 L yellow fluid obtained Tolerated well No labs per MD  EBL: none

## 2019-05-05 DIAGNOSIS — I083 Combined rheumatic disorders of mitral, aortic and tricuspid valves: Secondary | ICD-10-CM | POA: Diagnosis not present

## 2019-05-05 DIAGNOSIS — I130 Hypertensive heart and chronic kidney disease with heart failure and stage 1 through stage 4 chronic kidney disease, or unspecified chronic kidney disease: Secondary | ICD-10-CM | POA: Diagnosis not present

## 2019-05-05 DIAGNOSIS — I5033 Acute on chronic diastolic (congestive) heart failure: Secondary | ICD-10-CM | POA: Diagnosis not present

## 2019-05-05 DIAGNOSIS — I10 Essential (primary) hypertension: Secondary | ICD-10-CM | POA: Diagnosis not present

## 2019-05-05 DIAGNOSIS — D631 Anemia in chronic kidney disease: Secondary | ICD-10-CM | POA: Diagnosis not present

## 2019-05-05 DIAGNOSIS — N1832 Chronic kidney disease, stage 3b: Secondary | ICD-10-CM | POA: Diagnosis not present

## 2019-05-05 DIAGNOSIS — I509 Heart failure, unspecified: Secondary | ICD-10-CM | POA: Diagnosis not present

## 2019-05-05 DIAGNOSIS — I0981 Rheumatic heart failure: Secondary | ICD-10-CM | POA: Diagnosis not present

## 2019-05-06 DIAGNOSIS — N1832 Chronic kidney disease, stage 3b: Secondary | ICD-10-CM | POA: Diagnosis not present

## 2019-05-06 DIAGNOSIS — I0989 Other specified rheumatic heart diseases: Secondary | ICD-10-CM | POA: Diagnosis not present

## 2019-05-06 DIAGNOSIS — I5033 Acute on chronic diastolic (congestive) heart failure: Secondary | ICD-10-CM | POA: Diagnosis not present

## 2019-05-06 DIAGNOSIS — I130 Hypertensive heart and chronic kidney disease with heart failure and stage 1 through stage 4 chronic kidney disease, or unspecified chronic kidney disease: Secondary | ICD-10-CM | POA: Diagnosis not present

## 2019-05-06 DIAGNOSIS — I0981 Rheumatic heart failure: Secondary | ICD-10-CM | POA: Diagnosis not present

## 2019-05-06 DIAGNOSIS — I083 Combined rheumatic disorders of mitral, aortic and tricuspid valves: Secondary | ICD-10-CM | POA: Diagnosis not present

## 2019-05-06 DIAGNOSIS — D631 Anemia in chronic kidney disease: Secondary | ICD-10-CM | POA: Diagnosis not present

## 2019-05-07 DIAGNOSIS — I0981 Rheumatic heart failure: Secondary | ICD-10-CM | POA: Diagnosis not present

## 2019-05-07 DIAGNOSIS — N1832 Chronic kidney disease, stage 3b: Secondary | ICD-10-CM | POA: Diagnosis not present

## 2019-05-07 DIAGNOSIS — D631 Anemia in chronic kidney disease: Secondary | ICD-10-CM | POA: Diagnosis not present

## 2019-05-07 DIAGNOSIS — I130 Hypertensive heart and chronic kidney disease with heart failure and stage 1 through stage 4 chronic kidney disease, or unspecified chronic kidney disease: Secondary | ICD-10-CM | POA: Diagnosis not present

## 2019-05-07 DIAGNOSIS — I083 Combined rheumatic disorders of mitral, aortic and tricuspid valves: Secondary | ICD-10-CM | POA: Diagnosis not present

## 2019-05-07 DIAGNOSIS — E781 Pure hyperglyceridemia: Secondary | ICD-10-CM | POA: Diagnosis not present

## 2019-05-07 DIAGNOSIS — I5033 Acute on chronic diastolic (congestive) heart failure: Secondary | ICD-10-CM | POA: Diagnosis not present

## 2019-05-11 ENCOUNTER — Other Ambulatory Visit: Payer: Self-pay | Admitting: General Practice

## 2019-05-11 DIAGNOSIS — C569 Malignant neoplasm of unspecified ovary: Secondary | ICD-10-CM | POA: Diagnosis not present

## 2019-05-11 DIAGNOSIS — K859 Acute pancreatitis without necrosis or infection, unspecified: Secondary | ICD-10-CM | POA: Diagnosis not present

## 2019-05-11 DIAGNOSIS — E02 Subclinical iodine-deficiency hypothyroidism: Secondary | ICD-10-CM | POA: Diagnosis not present

## 2019-05-11 DIAGNOSIS — I440 Atrioventricular block, first degree: Secondary | ICD-10-CM | POA: Diagnosis not present

## 2019-05-11 DIAGNOSIS — G8929 Other chronic pain: Secondary | ICD-10-CM | POA: Diagnosis not present

## 2019-05-11 DIAGNOSIS — I5033 Acute on chronic diastolic (congestive) heart failure: Secondary | ICD-10-CM | POA: Diagnosis not present

## 2019-05-11 DIAGNOSIS — N281 Cyst of kidney, acquired: Secondary | ICD-10-CM | POA: Diagnosis not present

## 2019-05-11 DIAGNOSIS — I083 Combined rheumatic disorders of mitral, aortic and tricuspid valves: Secondary | ICD-10-CM | POA: Diagnosis not present

## 2019-05-11 DIAGNOSIS — I4510 Unspecified right bundle-branch block: Secondary | ICD-10-CM | POA: Diagnosis not present

## 2019-05-11 DIAGNOSIS — J910 Malignant pleural effusion: Secondary | ICD-10-CM | POA: Diagnosis not present

## 2019-05-11 DIAGNOSIS — D630 Anemia in neoplastic disease: Secondary | ICD-10-CM | POA: Diagnosis not present

## 2019-05-11 DIAGNOSIS — M549 Dorsalgia, unspecified: Secondary | ICD-10-CM | POA: Diagnosis not present

## 2019-05-11 DIAGNOSIS — R180 Malignant ascites: Secondary | ICD-10-CM | POA: Diagnosis not present

## 2019-05-11 DIAGNOSIS — N179 Acute kidney failure, unspecified: Secondary | ICD-10-CM | POA: Diagnosis not present

## 2019-05-11 DIAGNOSIS — I130 Hypertensive heart and chronic kidney disease with heart failure and stage 1 through stage 4 chronic kidney disease, or unspecified chronic kidney disease: Secondary | ICD-10-CM | POA: Diagnosis not present

## 2019-05-11 DIAGNOSIS — D631 Anemia in chronic kidney disease: Secondary | ICD-10-CM | POA: Diagnosis not present

## 2019-05-11 DIAGNOSIS — K5750 Diverticulosis of both small and large intestine without perforation or abscess without bleeding: Secondary | ICD-10-CM | POA: Diagnosis not present

## 2019-05-11 DIAGNOSIS — J9811 Atelectasis: Secondary | ICD-10-CM | POA: Diagnosis not present

## 2019-05-11 DIAGNOSIS — E0781 Sick-euthyroid syndrome: Secondary | ICD-10-CM | POA: Diagnosis not present

## 2019-05-11 DIAGNOSIS — E785 Hyperlipidemia, unspecified: Secondary | ICD-10-CM | POA: Diagnosis not present

## 2019-05-11 DIAGNOSIS — N1832 Chronic kidney disease, stage 3b: Secondary | ICD-10-CM | POA: Diagnosis not present

## 2019-05-11 DIAGNOSIS — M810 Age-related osteoporosis without current pathological fracture: Secondary | ICD-10-CM | POA: Diagnosis not present

## 2019-05-11 DIAGNOSIS — I700 Atherosclerosis of aorta: Secondary | ICD-10-CM | POA: Diagnosis not present

## 2019-05-11 DIAGNOSIS — K449 Diaphragmatic hernia without obstruction or gangrene: Secondary | ICD-10-CM | POA: Diagnosis not present

## 2019-05-11 DIAGNOSIS — I0981 Rheumatic heart failure: Secondary | ICD-10-CM | POA: Diagnosis not present

## 2019-05-12 DIAGNOSIS — N1832 Chronic kidney disease, stage 3b: Secondary | ICD-10-CM | POA: Diagnosis not present

## 2019-05-12 DIAGNOSIS — D631 Anemia in chronic kidney disease: Secondary | ICD-10-CM | POA: Diagnosis not present

## 2019-05-12 DIAGNOSIS — I0981 Rheumatic heart failure: Secondary | ICD-10-CM | POA: Diagnosis not present

## 2019-05-12 DIAGNOSIS — I083 Combined rheumatic disorders of mitral, aortic and tricuspid valves: Secondary | ICD-10-CM | POA: Diagnosis not present

## 2019-05-12 DIAGNOSIS — I130 Hypertensive heart and chronic kidney disease with heart failure and stage 1 through stage 4 chronic kidney disease, or unspecified chronic kidney disease: Secondary | ICD-10-CM | POA: Diagnosis not present

## 2019-05-12 DIAGNOSIS — I5033 Acute on chronic diastolic (congestive) heart failure: Secondary | ICD-10-CM | POA: Diagnosis not present

## 2019-05-19 DIAGNOSIS — I130 Hypertensive heart and chronic kidney disease with heart failure and stage 1 through stage 4 chronic kidney disease, or unspecified chronic kidney disease: Secondary | ICD-10-CM | POA: Diagnosis not present

## 2019-05-19 DIAGNOSIS — I5033 Acute on chronic diastolic (congestive) heart failure: Secondary | ICD-10-CM | POA: Diagnosis not present

## 2019-05-19 DIAGNOSIS — I083 Combined rheumatic disorders of mitral, aortic and tricuspid valves: Secondary | ICD-10-CM | POA: Diagnosis not present

## 2019-05-19 DIAGNOSIS — N1832 Chronic kidney disease, stage 3b: Secondary | ICD-10-CM | POA: Diagnosis not present

## 2019-05-19 DIAGNOSIS — I0981 Rheumatic heart failure: Secondary | ICD-10-CM | POA: Diagnosis not present

## 2019-05-19 DIAGNOSIS — D631 Anemia in chronic kidney disease: Secondary | ICD-10-CM | POA: Diagnosis not present

## 2019-05-21 DIAGNOSIS — D631 Anemia in chronic kidney disease: Secondary | ICD-10-CM | POA: Diagnosis not present

## 2019-05-21 DIAGNOSIS — N1832 Chronic kidney disease, stage 3b: Secondary | ICD-10-CM | POA: Diagnosis not present

## 2019-05-21 DIAGNOSIS — I5033 Acute on chronic diastolic (congestive) heart failure: Secondary | ICD-10-CM | POA: Diagnosis not present

## 2019-05-21 DIAGNOSIS — I083 Combined rheumatic disorders of mitral, aortic and tricuspid valves: Secondary | ICD-10-CM | POA: Diagnosis not present

## 2019-05-21 DIAGNOSIS — I130 Hypertensive heart and chronic kidney disease with heart failure and stage 1 through stage 4 chronic kidney disease, or unspecified chronic kidney disease: Secondary | ICD-10-CM | POA: Diagnosis not present

## 2019-05-21 DIAGNOSIS — I0981 Rheumatic heart failure: Secondary | ICD-10-CM | POA: Diagnosis not present

## 2019-05-24 DIAGNOSIS — I0981 Rheumatic heart failure: Secondary | ICD-10-CM | POA: Diagnosis not present

## 2019-05-24 DIAGNOSIS — N1832 Chronic kidney disease, stage 3b: Secondary | ICD-10-CM | POA: Diagnosis not present

## 2019-05-24 DIAGNOSIS — D631 Anemia in chronic kidney disease: Secondary | ICD-10-CM | POA: Diagnosis not present

## 2019-05-24 DIAGNOSIS — I130 Hypertensive heart and chronic kidney disease with heart failure and stage 1 through stage 4 chronic kidney disease, or unspecified chronic kidney disease: Secondary | ICD-10-CM | POA: Diagnosis not present

## 2019-05-24 DIAGNOSIS — I5033 Acute on chronic diastolic (congestive) heart failure: Secondary | ICD-10-CM | POA: Diagnosis not present

## 2019-05-24 DIAGNOSIS — I083 Combined rheumatic disorders of mitral, aortic and tricuspid valves: Secondary | ICD-10-CM | POA: Diagnosis not present

## 2019-05-27 DIAGNOSIS — I130 Hypertensive heart and chronic kidney disease with heart failure and stage 1 through stage 4 chronic kidney disease, or unspecified chronic kidney disease: Secondary | ICD-10-CM | POA: Diagnosis not present

## 2019-05-27 DIAGNOSIS — C482 Malignant neoplasm of peritoneum, unspecified: Secondary | ICD-10-CM | POA: Diagnosis not present

## 2019-05-27 DIAGNOSIS — R180 Malignant ascites: Secondary | ICD-10-CM | POA: Diagnosis not present

## 2019-05-27 DIAGNOSIS — N183 Chronic kidney disease, stage 3 unspecified: Secondary | ICD-10-CM | POA: Diagnosis not present

## 2019-05-27 DIAGNOSIS — M810 Age-related osteoporosis without current pathological fracture: Secondary | ICD-10-CM | POA: Diagnosis not present

## 2019-05-27 DIAGNOSIS — I083 Combined rheumatic disorders of mitral, aortic and tricuspid valves: Secondary | ICD-10-CM | POA: Diagnosis not present

## 2019-05-27 DIAGNOSIS — I5033 Acute on chronic diastolic (congestive) heart failure: Secondary | ICD-10-CM | POA: Diagnosis not present

## 2019-05-27 DIAGNOSIS — I350 Nonrheumatic aortic (valve) stenosis: Secondary | ICD-10-CM | POA: Diagnosis not present

## 2019-05-27 DIAGNOSIS — I5032 Chronic diastolic (congestive) heart failure: Secondary | ICD-10-CM | POA: Diagnosis not present

## 2019-05-27 DIAGNOSIS — I4510 Unspecified right bundle-branch block: Secondary | ICD-10-CM | POA: Diagnosis not present

## 2019-05-27 DIAGNOSIS — M549 Dorsalgia, unspecified: Secondary | ICD-10-CM | POA: Diagnosis not present

## 2019-05-27 DIAGNOSIS — N1832 Chronic kidney disease, stage 3b: Secondary | ICD-10-CM | POA: Diagnosis not present

## 2019-05-27 DIAGNOSIS — I0981 Rheumatic heart failure: Secondary | ICD-10-CM | POA: Diagnosis not present

## 2019-05-27 DIAGNOSIS — D631 Anemia in chronic kidney disease: Secondary | ICD-10-CM | POA: Diagnosis not present

## 2019-05-27 DIAGNOSIS — G8929 Other chronic pain: Secondary | ICD-10-CM | POA: Diagnosis not present

## 2019-05-29 DIAGNOSIS — I5032 Chronic diastolic (congestive) heart failure: Secondary | ICD-10-CM | POA: Diagnosis not present

## 2019-05-29 DIAGNOSIS — I4510 Unspecified right bundle-branch block: Secondary | ICD-10-CM | POA: Diagnosis not present

## 2019-05-29 DIAGNOSIS — I130 Hypertensive heart and chronic kidney disease with heart failure and stage 1 through stage 4 chronic kidney disease, or unspecified chronic kidney disease: Secondary | ICD-10-CM | POA: Diagnosis not present

## 2019-05-29 DIAGNOSIS — N183 Chronic kidney disease, stage 3 unspecified: Secondary | ICD-10-CM | POA: Diagnosis not present

## 2019-05-29 DIAGNOSIS — C482 Malignant neoplasm of peritoneum, unspecified: Secondary | ICD-10-CM | POA: Diagnosis not present

## 2019-05-29 DIAGNOSIS — I350 Nonrheumatic aortic (valve) stenosis: Secondary | ICD-10-CM | POA: Diagnosis not present

## 2019-05-31 DIAGNOSIS — I4510 Unspecified right bundle-branch block: Secondary | ICD-10-CM | POA: Diagnosis not present

## 2019-05-31 DIAGNOSIS — I350 Nonrheumatic aortic (valve) stenosis: Secondary | ICD-10-CM | POA: Diagnosis not present

## 2019-05-31 DIAGNOSIS — I130 Hypertensive heart and chronic kidney disease with heart failure and stage 1 through stage 4 chronic kidney disease, or unspecified chronic kidney disease: Secondary | ICD-10-CM | POA: Diagnosis not present

## 2019-05-31 DIAGNOSIS — N183 Chronic kidney disease, stage 3 unspecified: Secondary | ICD-10-CM | POA: Diagnosis not present

## 2019-05-31 DIAGNOSIS — I5032 Chronic diastolic (congestive) heart failure: Secondary | ICD-10-CM | POA: Diagnosis not present

## 2019-05-31 DIAGNOSIS — C482 Malignant neoplasm of peritoneum, unspecified: Secondary | ICD-10-CM | POA: Diagnosis not present

## 2019-06-01 ENCOUNTER — Other Ambulatory Visit: Payer: Self-pay | Admitting: Family Medicine

## 2019-06-01 ENCOUNTER — Other Ambulatory Visit (HOSPITAL_COMMUNITY): Payer: Self-pay | Admitting: Family Medicine

## 2019-06-01 DIAGNOSIS — C482 Malignant neoplasm of peritoneum, unspecified: Secondary | ICD-10-CM | POA: Diagnosis not present

## 2019-06-01 DIAGNOSIS — I130 Hypertensive heart and chronic kidney disease with heart failure and stage 1 through stage 4 chronic kidney disease, or unspecified chronic kidney disease: Secondary | ICD-10-CM | POA: Diagnosis not present

## 2019-06-01 DIAGNOSIS — N183 Chronic kidney disease, stage 3 unspecified: Secondary | ICD-10-CM | POA: Diagnosis not present

## 2019-06-01 DIAGNOSIS — I5032 Chronic diastolic (congestive) heart failure: Secondary | ICD-10-CM | POA: Diagnosis not present

## 2019-06-01 DIAGNOSIS — I4510 Unspecified right bundle-branch block: Secondary | ICD-10-CM | POA: Diagnosis not present

## 2019-06-01 DIAGNOSIS — I350 Nonrheumatic aortic (valve) stenosis: Secondary | ICD-10-CM | POA: Diagnosis not present

## 2019-06-01 DIAGNOSIS — R180 Malignant ascites: Secondary | ICD-10-CM

## 2019-06-02 ENCOUNTER — Ambulatory Visit (HOSPITAL_COMMUNITY)
Admission: RE | Admit: 2019-06-02 | Discharge: 2019-06-02 | Disposition: A | Discharge status: Home / Self Care | Source: Ambulatory Visit | Attending: Family Medicine | Admitting: Family Medicine

## 2019-06-02 ENCOUNTER — Other Ambulatory Visit: Payer: Self-pay

## 2019-06-02 DIAGNOSIS — R180 Malignant ascites: Secondary | ICD-10-CM | POA: Diagnosis not present

## 2019-06-02 DIAGNOSIS — R188 Other ascites: Secondary | ICD-10-CM | POA: Diagnosis not present

## 2019-06-02 DIAGNOSIS — C801 Malignant (primary) neoplasm, unspecified: Secondary | ICD-10-CM | POA: Diagnosis not present

## 2019-06-02 HISTORY — PX: IR PARACENTESIS: IMG2679

## 2019-06-02 LAB — GLUCOSE, CAPILLARY: Glucose-Capillary: 85 mg/dL (ref 70–99)

## 2019-06-02 MED ORDER — LIDOCAINE HCL (PF) 1 % IJ SOLN
INTRAMUSCULAR | Status: AC | PRN
  Administered 2019-06-02: 10 mL

## 2019-06-02 MED ORDER — LIDOCAINE HCL 1 % IJ SOLN
INTRAMUSCULAR | Status: AC
  Filled 2019-06-02: qty 20

## 2019-06-02 NOTE — Procedures (Signed)
PROCEDURE SUMMARY:  Successful US guided paracentesis from right lateral abdomen.  Yielded 4.3 liters of yellow fluid.  No immediate complications.  Pt tolerated well although she is noted to be weak and complains of general malaise. States this has been going on for the past few weeks.  Daughter at bedside post procedure states she has not been able to eat and drink much due to ascites.  Vital signs were stable throughout.  She does require continuous O2 to maintain saturations in the high 90s.  CBG 85.  Specimen was not sent for labs.  EBL < 45mL  Docia Barrier PA-C 06/02/2019 10:30 AM

## 2019-06-03 DIAGNOSIS — I4510 Unspecified right bundle-branch block: Secondary | ICD-10-CM | POA: Diagnosis not present

## 2019-06-03 DIAGNOSIS — N183 Chronic kidney disease, stage 3 unspecified: Secondary | ICD-10-CM | POA: Diagnosis not present

## 2019-06-03 DIAGNOSIS — C482 Malignant neoplasm of peritoneum, unspecified: Secondary | ICD-10-CM | POA: Diagnosis not present

## 2019-06-03 DIAGNOSIS — I5032 Chronic diastolic (congestive) heart failure: Secondary | ICD-10-CM | POA: Diagnosis not present

## 2019-06-03 DIAGNOSIS — I130 Hypertensive heart and chronic kidney disease with heart failure and stage 1 through stage 4 chronic kidney disease, or unspecified chronic kidney disease: Secondary | ICD-10-CM | POA: Diagnosis not present

## 2019-06-03 DIAGNOSIS — I350 Nonrheumatic aortic (valve) stenosis: Secondary | ICD-10-CM | POA: Diagnosis not present

## 2019-06-04 DIAGNOSIS — I5032 Chronic diastolic (congestive) heart failure: Secondary | ICD-10-CM | POA: Diagnosis not present

## 2019-06-04 DIAGNOSIS — I350 Nonrheumatic aortic (valve) stenosis: Secondary | ICD-10-CM | POA: Diagnosis not present

## 2019-06-04 DIAGNOSIS — I4510 Unspecified right bundle-branch block: Secondary | ICD-10-CM | POA: Diagnosis not present

## 2019-06-04 DIAGNOSIS — C482 Malignant neoplasm of peritoneum, unspecified: Secondary | ICD-10-CM | POA: Diagnosis not present

## 2019-06-04 DIAGNOSIS — I130 Hypertensive heart and chronic kidney disease with heart failure and stage 1 through stage 4 chronic kidney disease, or unspecified chronic kidney disease: Secondary | ICD-10-CM | POA: Diagnosis not present

## 2019-06-04 DIAGNOSIS — N183 Chronic kidney disease, stage 3 unspecified: Secondary | ICD-10-CM | POA: Diagnosis not present

## 2019-06-08 DIAGNOSIS — I4510 Unspecified right bundle-branch block: Secondary | ICD-10-CM | POA: Diagnosis not present

## 2019-06-08 DIAGNOSIS — C482 Malignant neoplasm of peritoneum, unspecified: Secondary | ICD-10-CM | POA: Diagnosis not present

## 2019-06-08 DIAGNOSIS — N183 Chronic kidney disease, stage 3 unspecified: Secondary | ICD-10-CM | POA: Diagnosis not present

## 2019-06-08 DIAGNOSIS — I130 Hypertensive heart and chronic kidney disease with heart failure and stage 1 through stage 4 chronic kidney disease, or unspecified chronic kidney disease: Secondary | ICD-10-CM | POA: Diagnosis not present

## 2019-06-08 DIAGNOSIS — I350 Nonrheumatic aortic (valve) stenosis: Secondary | ICD-10-CM | POA: Diagnosis not present

## 2019-06-08 DIAGNOSIS — I5032 Chronic diastolic (congestive) heart failure: Secondary | ICD-10-CM | POA: Diagnosis not present

## 2019-06-09 DIAGNOSIS — C482 Malignant neoplasm of peritoneum, unspecified: Secondary | ICD-10-CM | POA: Diagnosis not present

## 2019-06-09 DIAGNOSIS — I4510 Unspecified right bundle-branch block: Secondary | ICD-10-CM | POA: Diagnosis not present

## 2019-06-09 DIAGNOSIS — I350 Nonrheumatic aortic (valve) stenosis: Secondary | ICD-10-CM | POA: Diagnosis not present

## 2019-06-09 DIAGNOSIS — I5032 Chronic diastolic (congestive) heart failure: Secondary | ICD-10-CM | POA: Diagnosis not present

## 2019-06-09 DIAGNOSIS — I130 Hypertensive heart and chronic kidney disease with heart failure and stage 1 through stage 4 chronic kidney disease, or unspecified chronic kidney disease: Secondary | ICD-10-CM | POA: Diagnosis not present

## 2019-06-09 DIAGNOSIS — N183 Chronic kidney disease, stage 3 unspecified: Secondary | ICD-10-CM | POA: Diagnosis not present

## 2019-06-12 DIAGNOSIS — I4510 Unspecified right bundle-branch block: Secondary | ICD-10-CM | POA: Diagnosis not present

## 2019-06-12 DIAGNOSIS — N183 Chronic kidney disease, stage 3 unspecified: Secondary | ICD-10-CM | POA: Diagnosis not present

## 2019-06-12 DIAGNOSIS — I130 Hypertensive heart and chronic kidney disease with heart failure and stage 1 through stage 4 chronic kidney disease, or unspecified chronic kidney disease: Secondary | ICD-10-CM | POA: Diagnosis not present

## 2019-06-12 DIAGNOSIS — G8929 Other chronic pain: Secondary | ICD-10-CM | POA: Diagnosis not present

## 2019-06-12 DIAGNOSIS — M810 Age-related osteoporosis without current pathological fracture: Secondary | ICD-10-CM | POA: Diagnosis not present

## 2019-06-12 DIAGNOSIS — M549 Dorsalgia, unspecified: Secondary | ICD-10-CM | POA: Diagnosis not present

## 2019-06-12 DIAGNOSIS — C482 Malignant neoplasm of peritoneum, unspecified: Secondary | ICD-10-CM | POA: Diagnosis not present

## 2019-06-12 DIAGNOSIS — R180 Malignant ascites: Secondary | ICD-10-CM | POA: Diagnosis not present

## 2019-06-12 DIAGNOSIS — I350 Nonrheumatic aortic (valve) stenosis: Secondary | ICD-10-CM | POA: Diagnosis not present

## 2019-06-12 DIAGNOSIS — I5032 Chronic diastolic (congestive) heart failure: Secondary | ICD-10-CM | POA: Diagnosis not present

## 2019-06-15 DIAGNOSIS — I130 Hypertensive heart and chronic kidney disease with heart failure and stage 1 through stage 4 chronic kidney disease, or unspecified chronic kidney disease: Secondary | ICD-10-CM | POA: Diagnosis not present

## 2019-06-15 DIAGNOSIS — N183 Chronic kidney disease, stage 3 unspecified: Secondary | ICD-10-CM | POA: Diagnosis not present

## 2019-06-15 DIAGNOSIS — I350 Nonrheumatic aortic (valve) stenosis: Secondary | ICD-10-CM | POA: Diagnosis not present

## 2019-06-15 DIAGNOSIS — I5032 Chronic diastolic (congestive) heart failure: Secondary | ICD-10-CM | POA: Diagnosis not present

## 2019-06-15 DIAGNOSIS — I4510 Unspecified right bundle-branch block: Secondary | ICD-10-CM | POA: Diagnosis not present

## 2019-06-15 DIAGNOSIS — C482 Malignant neoplasm of peritoneum, unspecified: Secondary | ICD-10-CM | POA: Diagnosis not present

## 2019-06-17 DIAGNOSIS — I350 Nonrheumatic aortic (valve) stenosis: Secondary | ICD-10-CM | POA: Diagnosis not present

## 2019-06-17 DIAGNOSIS — N183 Chronic kidney disease, stage 3 unspecified: Secondary | ICD-10-CM | POA: Diagnosis not present

## 2019-06-17 DIAGNOSIS — I4510 Unspecified right bundle-branch block: Secondary | ICD-10-CM | POA: Diagnosis not present

## 2019-06-17 DIAGNOSIS — I5032 Chronic diastolic (congestive) heart failure: Secondary | ICD-10-CM | POA: Diagnosis not present

## 2019-06-17 DIAGNOSIS — C482 Malignant neoplasm of peritoneum, unspecified: Secondary | ICD-10-CM | POA: Diagnosis not present

## 2019-06-17 DIAGNOSIS — I130 Hypertensive heart and chronic kidney disease with heart failure and stage 1 through stage 4 chronic kidney disease, or unspecified chronic kidney disease: Secondary | ICD-10-CM | POA: Diagnosis not present

## 2019-06-18 DIAGNOSIS — I5032 Chronic diastolic (congestive) heart failure: Secondary | ICD-10-CM | POA: Diagnosis not present

## 2019-06-18 DIAGNOSIS — N183 Chronic kidney disease, stage 3 unspecified: Secondary | ICD-10-CM | POA: Diagnosis not present

## 2019-06-18 DIAGNOSIS — C482 Malignant neoplasm of peritoneum, unspecified: Secondary | ICD-10-CM | POA: Diagnosis not present

## 2019-06-18 DIAGNOSIS — I4510 Unspecified right bundle-branch block: Secondary | ICD-10-CM | POA: Diagnosis not present

## 2019-06-18 DIAGNOSIS — I350 Nonrheumatic aortic (valve) stenosis: Secondary | ICD-10-CM | POA: Diagnosis not present

## 2019-06-18 DIAGNOSIS — I130 Hypertensive heart and chronic kidney disease with heart failure and stage 1 through stage 4 chronic kidney disease, or unspecified chronic kidney disease: Secondary | ICD-10-CM | POA: Diagnosis not present

## 2019-06-19 DIAGNOSIS — I130 Hypertensive heart and chronic kidney disease with heart failure and stage 1 through stage 4 chronic kidney disease, or unspecified chronic kidney disease: Secondary | ICD-10-CM | POA: Diagnosis not present

## 2019-06-19 DIAGNOSIS — I5032 Chronic diastolic (congestive) heart failure: Secondary | ICD-10-CM | POA: Diagnosis not present

## 2019-06-19 DIAGNOSIS — I350 Nonrheumatic aortic (valve) stenosis: Secondary | ICD-10-CM | POA: Diagnosis not present

## 2019-06-19 DIAGNOSIS — I4510 Unspecified right bundle-branch block: Secondary | ICD-10-CM | POA: Diagnosis not present

## 2019-06-19 DIAGNOSIS — N183 Chronic kidney disease, stage 3 unspecified: Secondary | ICD-10-CM | POA: Diagnosis not present

## 2019-06-19 DIAGNOSIS — C482 Malignant neoplasm of peritoneum, unspecified: Secondary | ICD-10-CM | POA: Diagnosis not present

## 2019-06-21 DIAGNOSIS — I5032 Chronic diastolic (congestive) heart failure: Secondary | ICD-10-CM | POA: Diagnosis not present

## 2019-06-21 DIAGNOSIS — C482 Malignant neoplasm of peritoneum, unspecified: Secondary | ICD-10-CM | POA: Diagnosis not present

## 2019-06-21 DIAGNOSIS — N183 Chronic kidney disease, stage 3 unspecified: Secondary | ICD-10-CM | POA: Diagnosis not present

## 2019-06-21 DIAGNOSIS — I350 Nonrheumatic aortic (valve) stenosis: Secondary | ICD-10-CM | POA: Diagnosis not present

## 2019-06-21 DIAGNOSIS — I130 Hypertensive heart and chronic kidney disease with heart failure and stage 1 through stage 4 chronic kidney disease, or unspecified chronic kidney disease: Secondary | ICD-10-CM | POA: Diagnosis not present

## 2019-06-21 DIAGNOSIS — I4510 Unspecified right bundle-branch block: Secondary | ICD-10-CM | POA: Diagnosis not present

## 2019-06-23 ENCOUNTER — Other Ambulatory Visit: Payer: Self-pay | Admitting: Family Medicine

## 2019-06-23 ENCOUNTER — Other Ambulatory Visit (HOSPITAL_COMMUNITY): Payer: Self-pay | Admitting: Family Medicine

## 2019-06-23 DIAGNOSIS — I130 Hypertensive heart and chronic kidney disease with heart failure and stage 1 through stage 4 chronic kidney disease, or unspecified chronic kidney disease: Secondary | ICD-10-CM | POA: Diagnosis not present

## 2019-06-23 DIAGNOSIS — I350 Nonrheumatic aortic (valve) stenosis: Secondary | ICD-10-CM | POA: Diagnosis not present

## 2019-06-23 DIAGNOSIS — N183 Chronic kidney disease, stage 3 unspecified: Secondary | ICD-10-CM | POA: Diagnosis not present

## 2019-06-23 DIAGNOSIS — C482 Malignant neoplasm of peritoneum, unspecified: Secondary | ICD-10-CM | POA: Diagnosis not present

## 2019-06-23 DIAGNOSIS — I4510 Unspecified right bundle-branch block: Secondary | ICD-10-CM | POA: Diagnosis not present

## 2019-06-23 DIAGNOSIS — R180 Malignant ascites: Secondary | ICD-10-CM

## 2019-06-23 DIAGNOSIS — I5032 Chronic diastolic (congestive) heart failure: Secondary | ICD-10-CM | POA: Diagnosis not present

## 2019-06-24 DIAGNOSIS — N183 Chronic kidney disease, stage 3 unspecified: Secondary | ICD-10-CM | POA: Diagnosis not present

## 2019-06-24 DIAGNOSIS — C482 Malignant neoplasm of peritoneum, unspecified: Secondary | ICD-10-CM | POA: Diagnosis not present

## 2019-06-24 DIAGNOSIS — I130 Hypertensive heart and chronic kidney disease with heart failure and stage 1 through stage 4 chronic kidney disease, or unspecified chronic kidney disease: Secondary | ICD-10-CM | POA: Diagnosis not present

## 2019-06-24 DIAGNOSIS — I5032 Chronic diastolic (congestive) heart failure: Secondary | ICD-10-CM | POA: Diagnosis not present

## 2019-06-24 DIAGNOSIS — I350 Nonrheumatic aortic (valve) stenosis: Secondary | ICD-10-CM | POA: Diagnosis not present

## 2019-06-24 DIAGNOSIS — I4510 Unspecified right bundle-branch block: Secondary | ICD-10-CM | POA: Diagnosis not present

## 2019-06-25 DIAGNOSIS — I350 Nonrheumatic aortic (valve) stenosis: Secondary | ICD-10-CM | POA: Diagnosis not present

## 2019-06-25 DIAGNOSIS — I4510 Unspecified right bundle-branch block: Secondary | ICD-10-CM | POA: Diagnosis not present

## 2019-06-25 DIAGNOSIS — I5032 Chronic diastolic (congestive) heart failure: Secondary | ICD-10-CM | POA: Diagnosis not present

## 2019-06-25 DIAGNOSIS — N183 Chronic kidney disease, stage 3 unspecified: Secondary | ICD-10-CM | POA: Diagnosis not present

## 2019-06-25 DIAGNOSIS — I130 Hypertensive heart and chronic kidney disease with heart failure and stage 1 through stage 4 chronic kidney disease, or unspecified chronic kidney disease: Secondary | ICD-10-CM | POA: Diagnosis not present

## 2019-06-25 DIAGNOSIS — C482 Malignant neoplasm of peritoneum, unspecified: Secondary | ICD-10-CM | POA: Diagnosis not present

## 2019-06-26 DIAGNOSIS — I4510 Unspecified right bundle-branch block: Secondary | ICD-10-CM | POA: Diagnosis not present

## 2019-06-26 DIAGNOSIS — I130 Hypertensive heart and chronic kidney disease with heart failure and stage 1 through stage 4 chronic kidney disease, or unspecified chronic kidney disease: Secondary | ICD-10-CM | POA: Diagnosis not present

## 2019-06-26 DIAGNOSIS — N183 Chronic kidney disease, stage 3 unspecified: Secondary | ICD-10-CM | POA: Diagnosis not present

## 2019-06-26 DIAGNOSIS — I5032 Chronic diastolic (congestive) heart failure: Secondary | ICD-10-CM | POA: Diagnosis not present

## 2019-06-26 DIAGNOSIS — C482 Malignant neoplasm of peritoneum, unspecified: Secondary | ICD-10-CM | POA: Diagnosis not present

## 2019-06-26 DIAGNOSIS — I350 Nonrheumatic aortic (valve) stenosis: Secondary | ICD-10-CM | POA: Diagnosis not present

## 2019-06-30 ENCOUNTER — Encounter (HOSPITAL_COMMUNITY): Payer: Self-pay

## 2019-06-30 ENCOUNTER — Ambulatory Visit (HOSPITAL_COMMUNITY): Admission: RE | Admit: 2019-06-30 | Source: Ambulatory Visit

## 2019-07-01 ENCOUNTER — Ambulatory Visit: Payer: Medicare Other | Admitting: Cardiology

## 2019-07-13 DEATH — deceased

## 2019-10-08 ENCOUNTER — Encounter (INDEPENDENT_AMBULATORY_CARE_PROVIDER_SITE_OTHER): Payer: Medicare Other | Admitting: Ophthalmology

## 2020-11-09 IMAGING — US US HEPATIC LIVER DOPPLER
1 series · 13 of 25 positions shown · non-contrast
Comparison: None.

CLINICAL DATA: Ascites.

EXAM:
DUPLEX ULTRASOUND OF LIVER
TECHNIQUE: Color and duplex Doppler ultrasound was performed to evaluate the
hepatic in-flow and out-flow vessels.

[Series 1: us hepatic liver doppler · 13 of 72 slices shown]
[im 1/72]
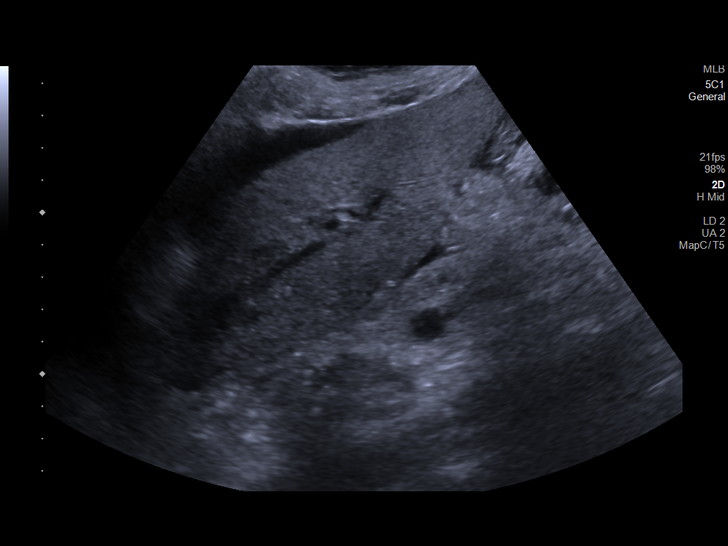
[im 6/72]
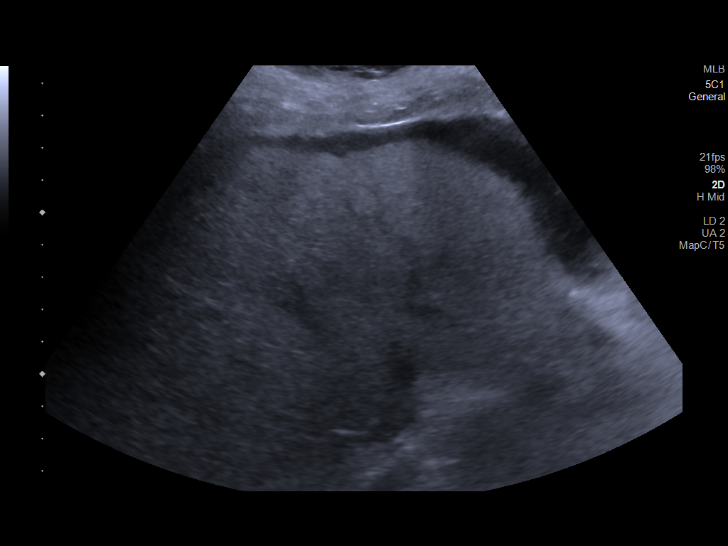
[im 12/72]
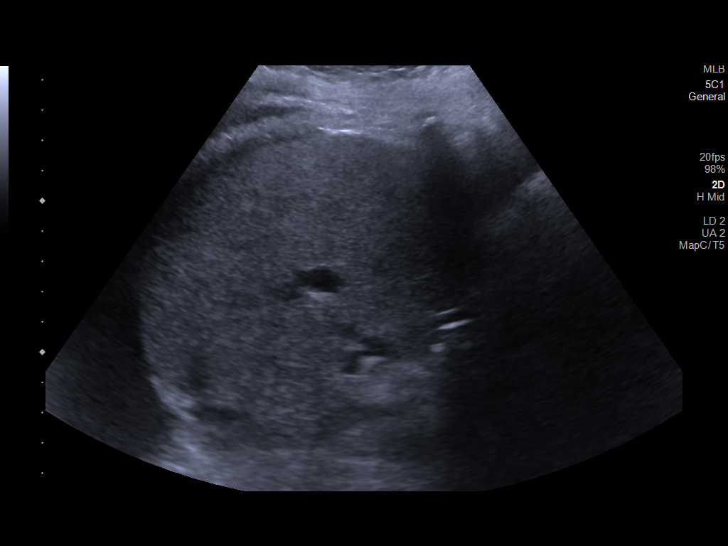
[im 18/72]
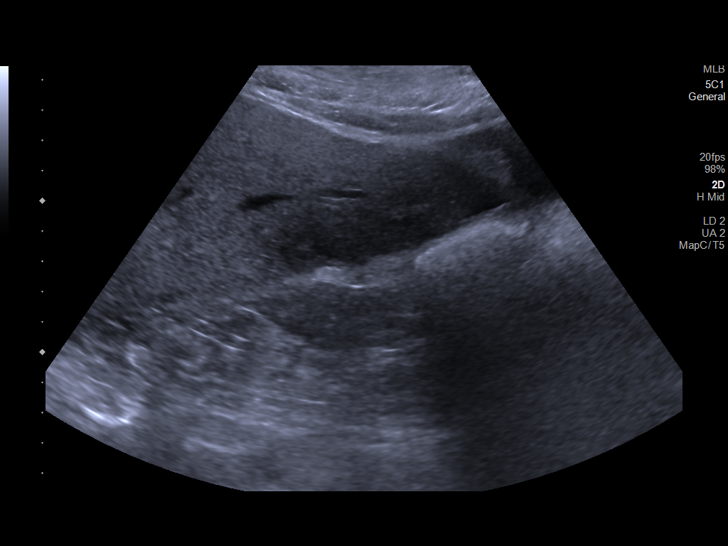
[im 24/72]
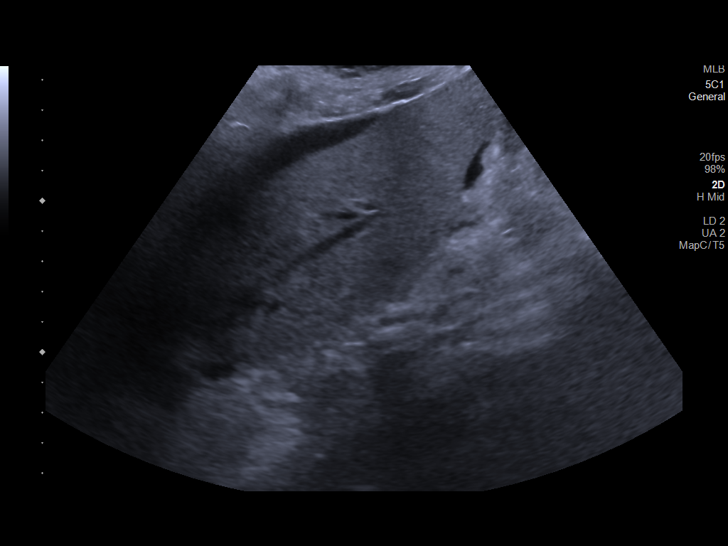
[im 30/72]
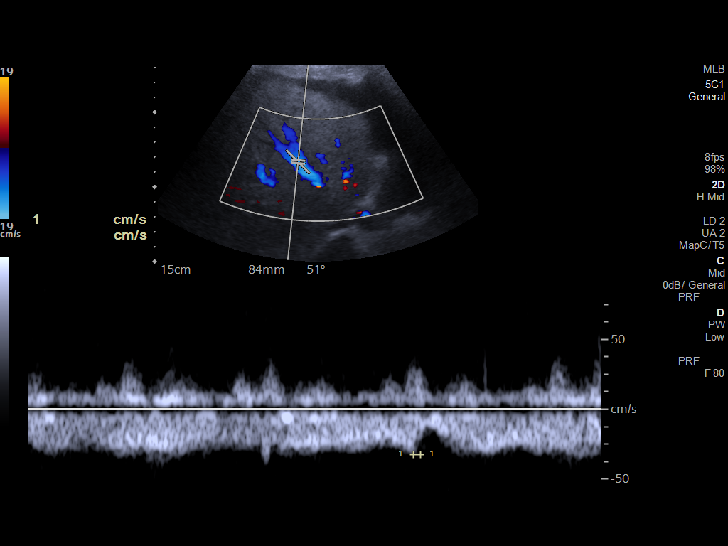
[im 36/72]
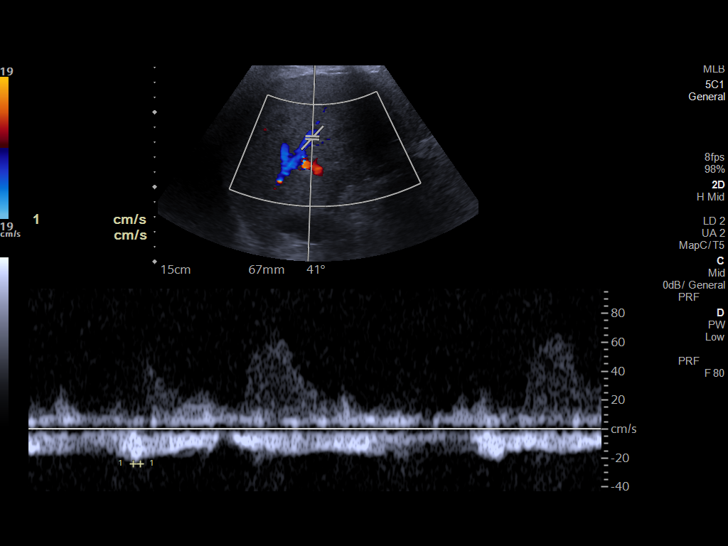
[im 42/72]
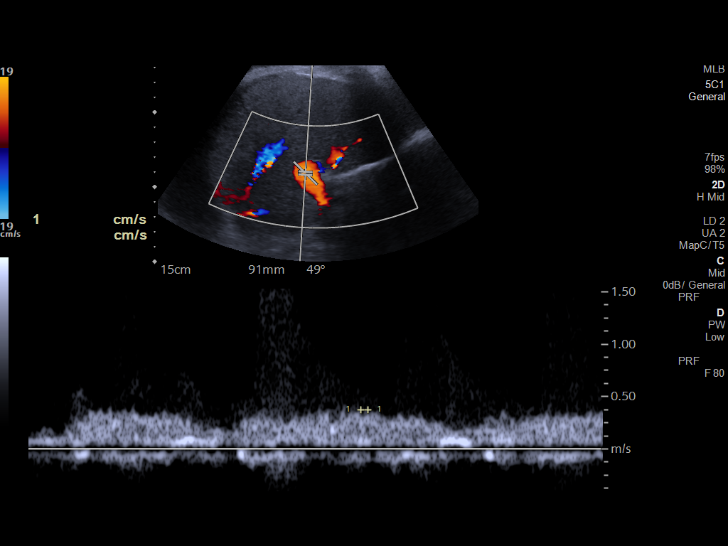
[im 48/72]
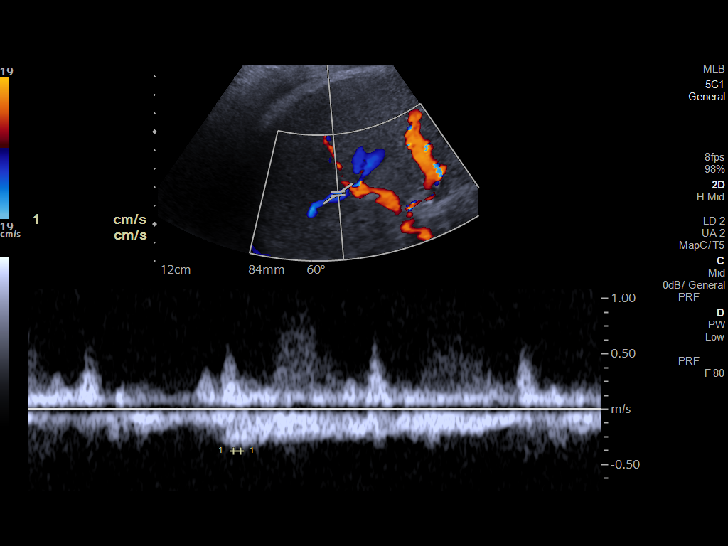
[im 54/72]
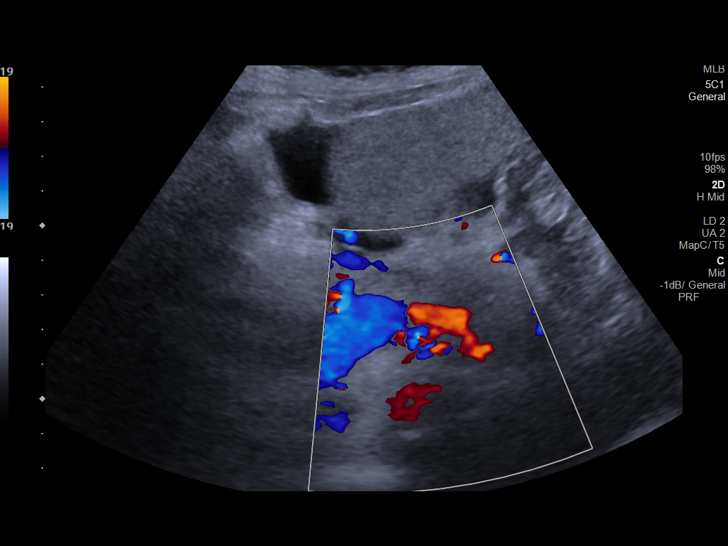
[im 60/72]
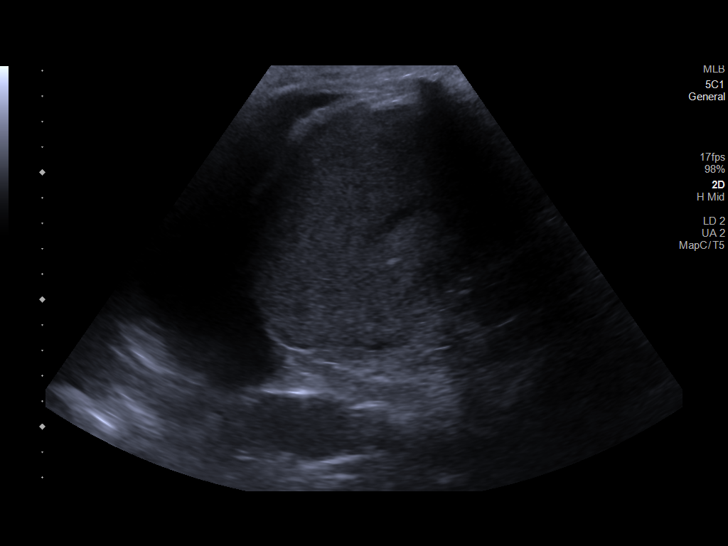
[im 66/72]
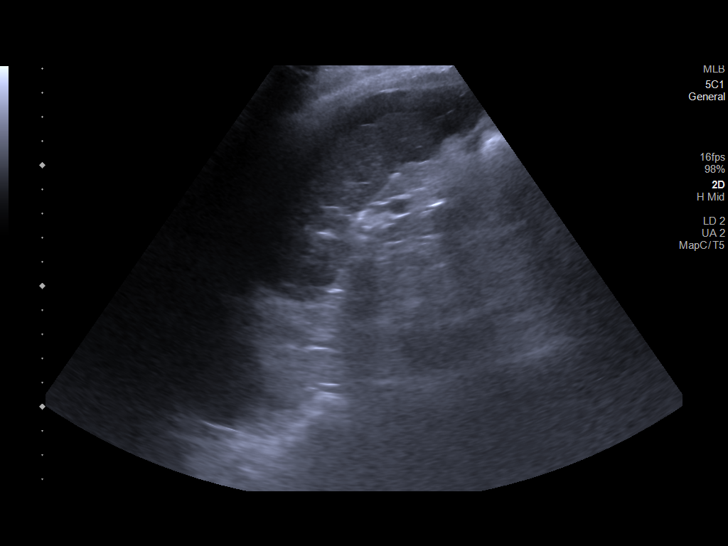
[im 72/72]
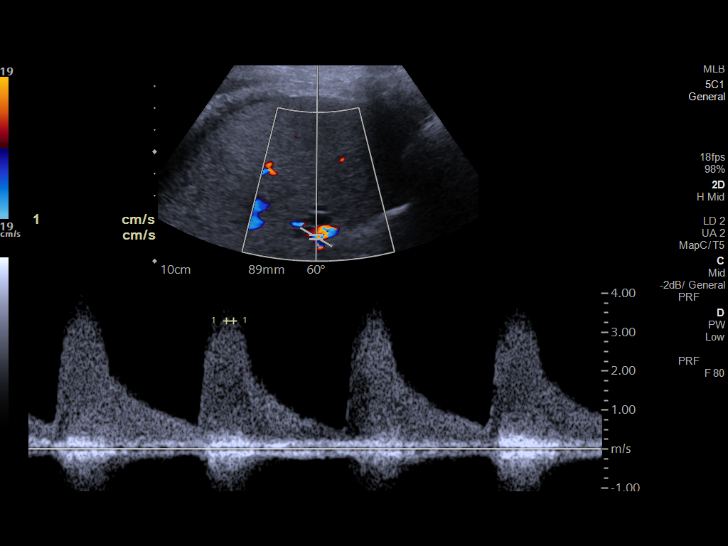

[13 of 25 positions shown; findings below may reference images not displayed]

FINDINGS: Liver: Increased echogenicity of hepatic parenchyma is noted
suggesting diffuse hepatocellular disease. Normal hepatic contour
without nodularity.

No focal lesion, mass or intrahepatic biliary ductal dilatation.

Main Portal Vein size: 1.2 cm

Portal Vein Velocities

Main Prox:  29 cm/sec

Main Mid: 42.2 cm/sec

Main Dist:  37.3 cm/sec
Right: 37.8 cm/sec
Left: 22.3 cm/sec

Normal hepatopetal flow is noted in the portal veins.

Hepatic Vein Velocities

Right:  65 cm/sec

Middle:  58.9 cm/sec

Left:  31 cm/sec

Normal hepatofugal flow is noted in the hepatic veins.

IVC: Present and patent with normal respiratory phasicity.

Hepatic Artery Velocity:  329 cm/sec

Splenic Vein Velocity:  43 cm/sec

Spleen: 5.0 cm x 9.9 cm x 3.6 cm with a total volume of 92 cm^3
(411 cm^3 is upper limit normal)

Portal Vein Occlusion/Thrombus: No

Splenic Vein Occlusion/Thrombus: No

Ascites: Mild ascites is noted.

Varices: None

Bilateral pleural effusions are noted.
IMPRESSION: No Doppler evidence of portal, hepatic or splenic venous thrombosis
or occlusion.

Increased echogenicity of hepatic parenchyma is noted suggesting
diffuse hepatocellular disease.

Mild ascites.

## 2020-11-11 IMAGING — CR DG CHEST 1V
1 series · 1 of 1 positions shown · non-contrast
Comparison: 04/03/2019

CLINICAL DATA: Status post right-sided thoracentesis.

EXAM:
CHEST  1 VIEW

[chest ap]
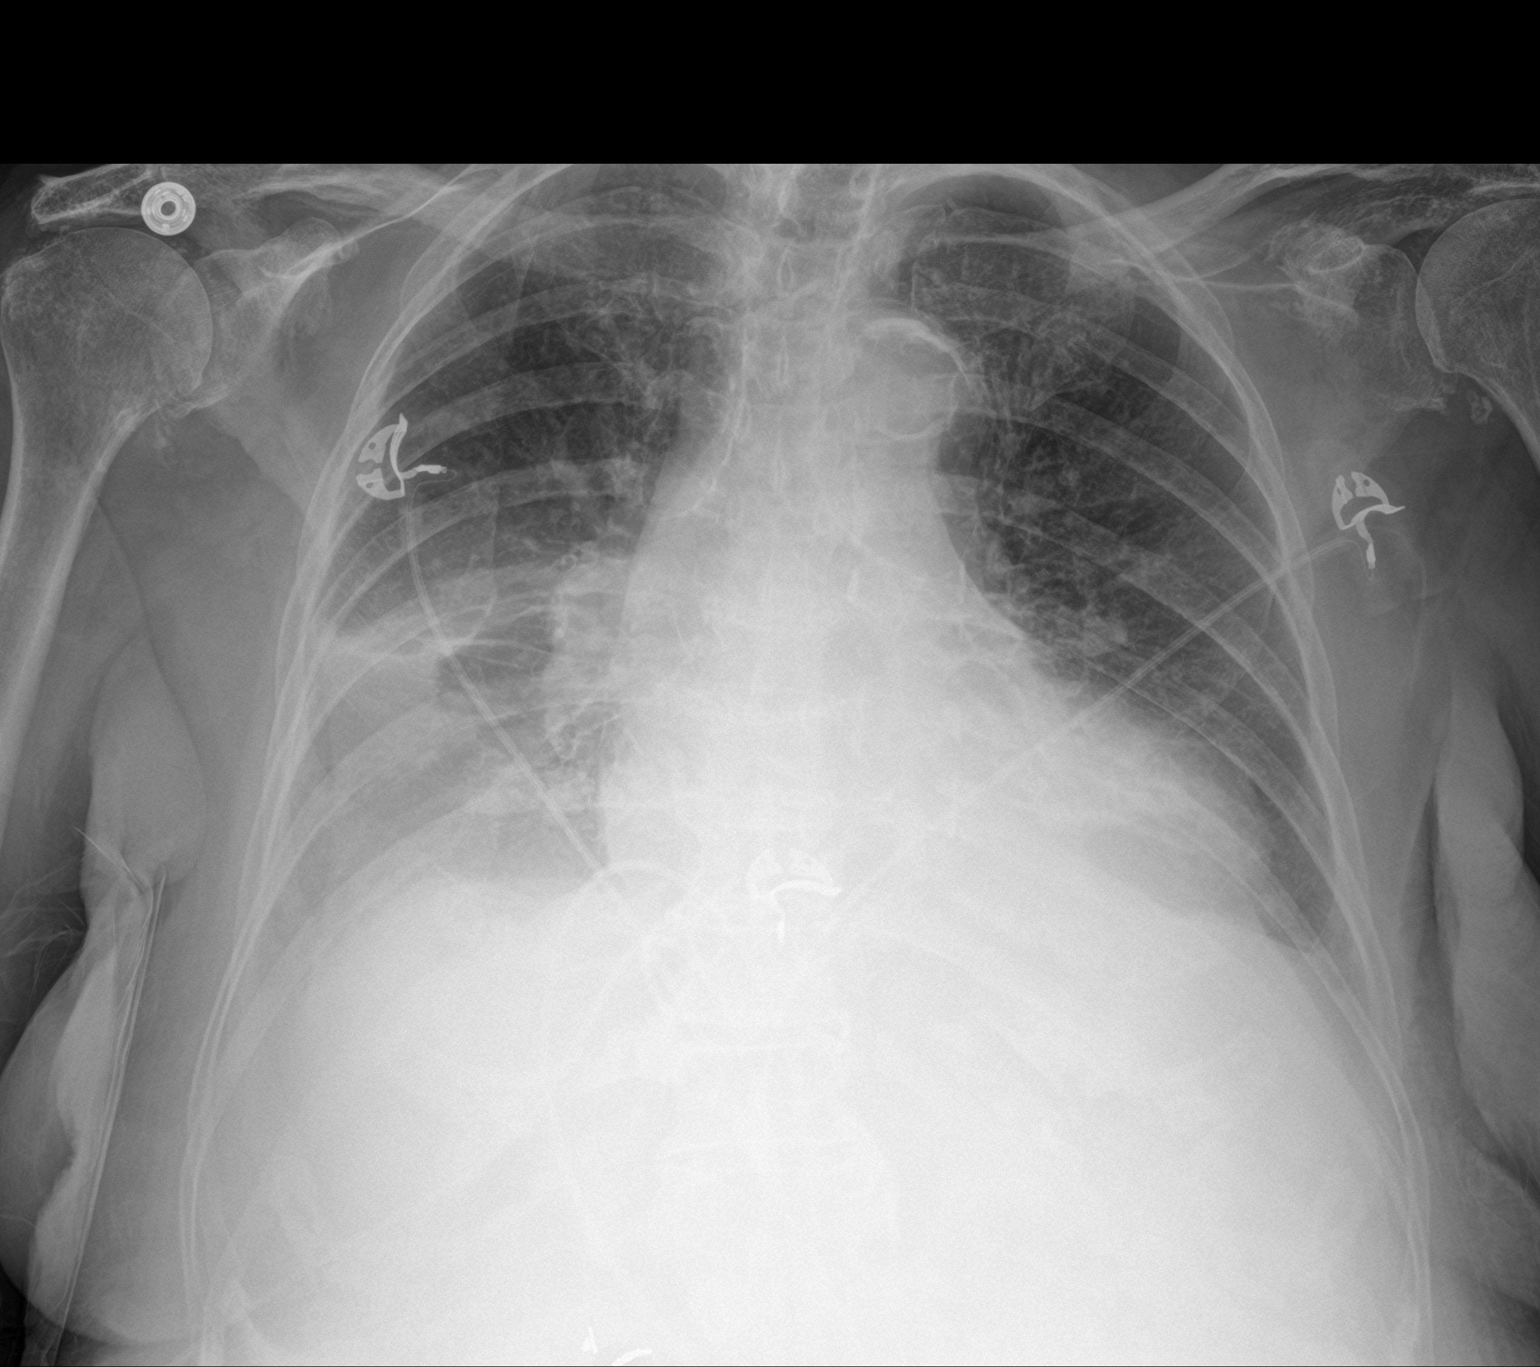

[1 of 1 positions shown; findings below may reference images not displayed]

FINDINGS: There is a persistent moderate size right-sided pleural effusion
with adjacent airspace disease. There is a small left-sided pleural
effusion. There is no pneumothorax. Heart size remains stable.
Aortic calcifications are noted. There is no acute osseous
abnormality.
IMPRESSION: 1. No pneumothorax status post right-sided thoracentesis.
2. Persistent moderate-sized right-sided pleural effusion with
adjacent airspace disease favored to represent compressive
atelectasis.
3. Persistent small left-sided pleural effusion.

## 2021-01-05 IMAGING — US IR PARACENTESIS
1 series · 3 of 3 positions shown · non-contrast
Comparison: none

INDICATION: ascites. Request is made for therapeutic paracentesis.

[Series 1: ir (id) (id)/(id)/(id) ir · 3 of 3 slices shown]
[im 1/3]
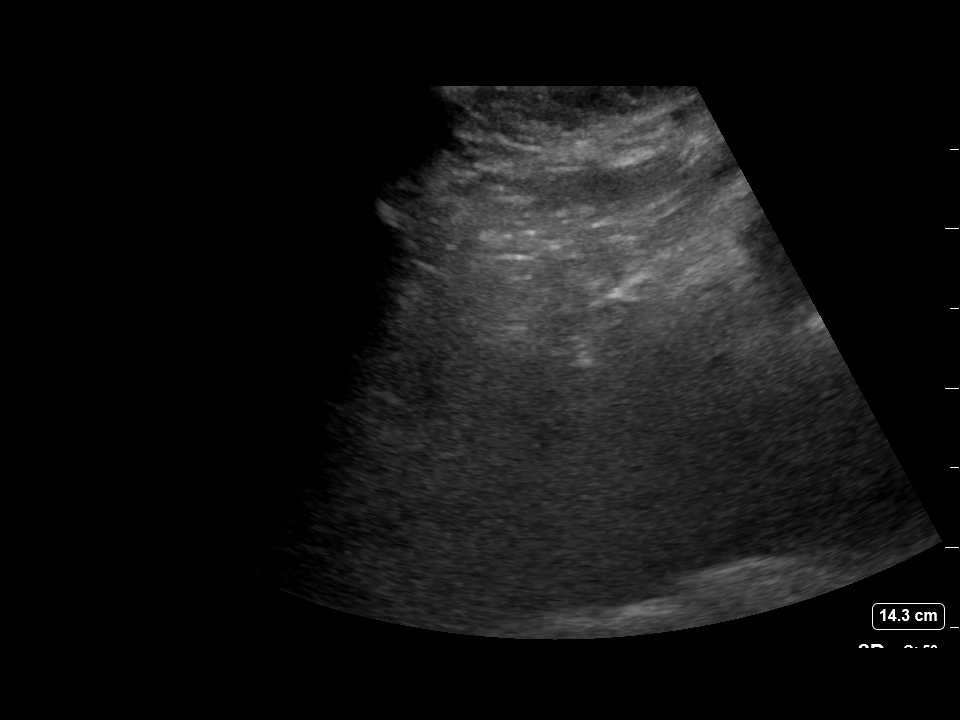
[im 2/3]
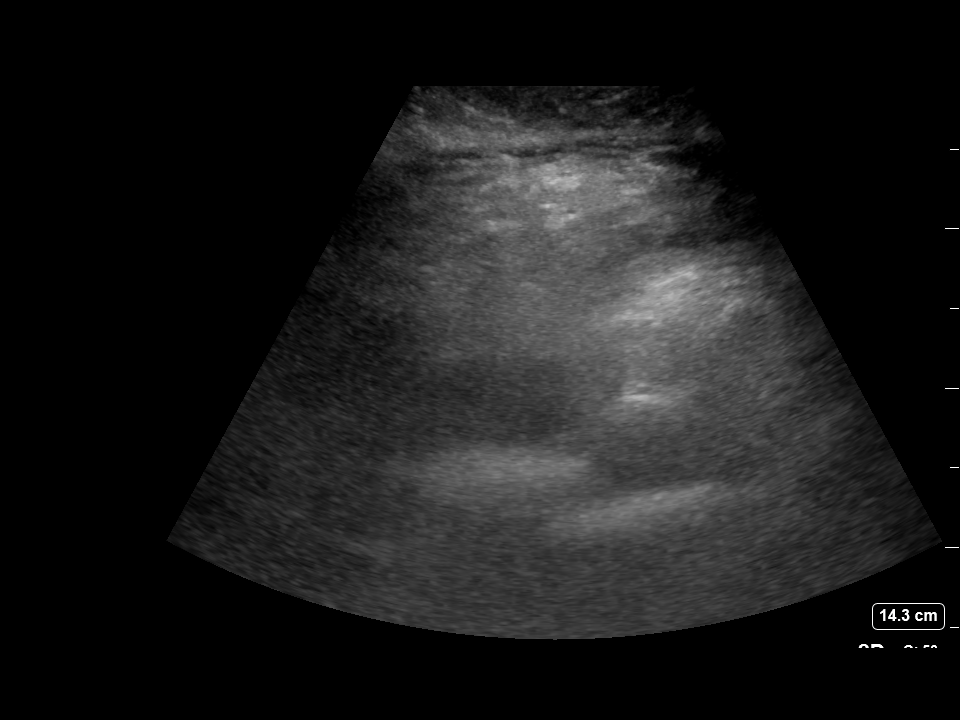
[im 3/3]
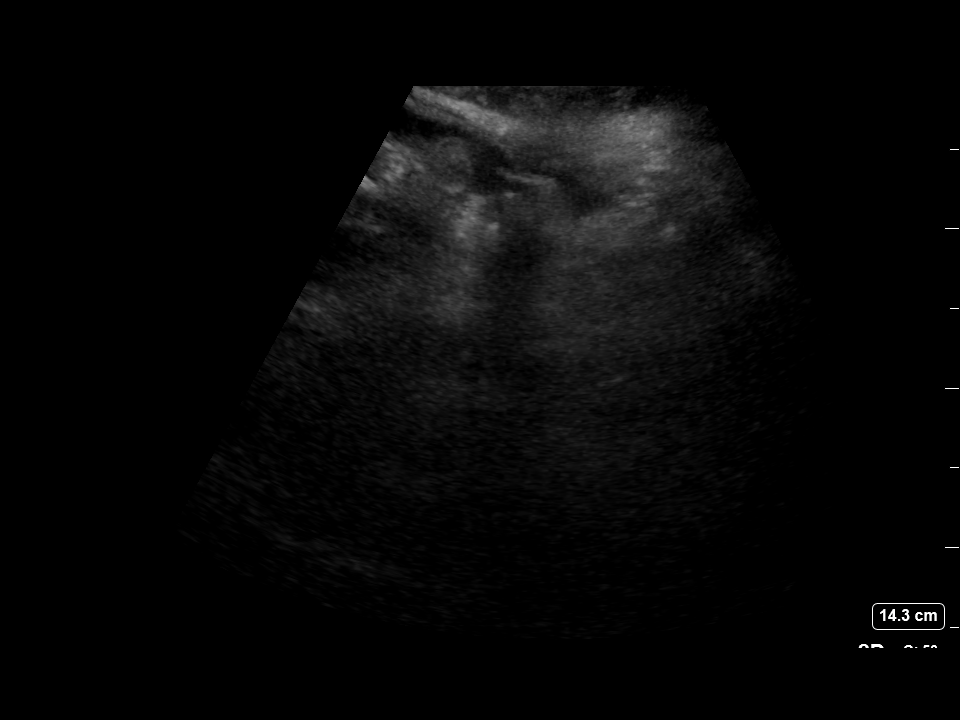

[3 of 3 positions shown; findings below may reference images not displayed]

EXAM:
ULTRASOUND GUIDED THERAPEUTIC PARACENTESIS

MEDICATIONS:
10 mL 1% lidocaine

COMPLICATIONS:
None immediate.

PROCEDURE:
Informed written consent was obtained from the patient after a
discussion of the risks, benefits and alternatives to treatment. A
timeout was performed prior to the initiation of the procedure.

Initial ultrasound scanning demonstrates a moderate amount of
ascites within the right lower abdominal quadrant. The right lower
abdomen was prepped and draped in the usual sterile fashion. 1%
lidocaine was used for local anesthesia.

Following this, a 19 gauge, 7-cm, Yueh catheter was introduced. An
ultrasound image was saved for documentation purposes. The
paracentesis was performed. The catheter was removed and a dressing
was applied. The patient tolerated the procedure well without
immediate post procedural complication.
FINDINGS: A total of approximately 4.3 liters of yellow fluid was removed.
Samples were sent to the laboratory as requested by the clinical
team.
IMPRESSION: Successful ultrasound-guided therapeutic paracentesis yielding
liters of peritoneal fluid.
